# Patient Record
Sex: Female | Born: 1995
Health system: Southern US, Community
[De-identification: ages and names within clinical notes are randomized; demographics above are authoritative.]

## PROBLEM LIST (undated history)

## (undated) DIAGNOSIS — F1910 Other psychoactive substance abuse, uncomplicated: Secondary | ICD-10-CM

## (undated) DIAGNOSIS — F1020 Alcohol dependence, uncomplicated: Secondary | ICD-10-CM

## (undated) DIAGNOSIS — R51 Headache: Secondary | ICD-10-CM

## (undated) DIAGNOSIS — L509 Urticaria, unspecified: Secondary | ICD-10-CM

## (undated) DIAGNOSIS — F329 Major depressive disorder, single episode, unspecified: Secondary | ICD-10-CM

## (undated) DIAGNOSIS — F609 Personality disorder, unspecified: Secondary | ICD-10-CM

## (undated) DIAGNOSIS — E669 Obesity, unspecified: Secondary | ICD-10-CM

## (undated) DIAGNOSIS — D649 Anemia, unspecified: Secondary | ICD-10-CM

## (undated) DIAGNOSIS — F1620 Hallucinogen dependence, uncomplicated: Secondary | ICD-10-CM

## (undated) DIAGNOSIS — F319 Bipolar disorder, unspecified: Secondary | ICD-10-CM

## (undated) DIAGNOSIS — J45909 Unspecified asthma, uncomplicated: Secondary | ICD-10-CM

## (undated) DIAGNOSIS — F32A Depression, unspecified: Secondary | ICD-10-CM

## (undated) HISTORY — DX: Alcohol dependence, uncomplicated: F10.20

## (undated) HISTORY — DX: Hallucinogen dependence, uncomplicated: F16.20

## (undated) HISTORY — DX: Headache: R51

## (undated) HISTORY — DX: Personality disorder, unspecified: F60.9

## (undated) HISTORY — DX: Bipolar disorder, unspecified: F31.9

## (undated) HISTORY — DX: Urticaria, unspecified: L50.9

## (undated) HISTORY — DX: Anemia, unspecified: D64.9

## (undated) HISTORY — DX: Obesity, unspecified: E66.9

## (undated) HISTORY — PX: WISDOM TOOTH EXTRACTION: SHX21

## (undated) HISTORY — PX: COLONOSCOPY: SHX174

## (undated) HISTORY — DX: Other psychoactive substance abuse, uncomplicated: F19.10

## (undated) HISTORY — DX: Unspecified asthma, uncomplicated: J45.909

---
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
## 2013-01-02 ENCOUNTER — Other Ambulatory Visit (HOSPITAL_COMMUNITY): Payer: BC Managed Care – PPO

## 2013-01-03 ENCOUNTER — Other Ambulatory Visit (HOSPITAL_COMMUNITY): Payer: BC Managed Care – PPO

## 2013-01-04 ENCOUNTER — Other Ambulatory Visit (HOSPITAL_COMMUNITY): Payer: BC Managed Care – PPO

## 2013-01-05 ENCOUNTER — Other Ambulatory Visit (HOSPITAL_COMMUNITY): Payer: BC Managed Care – PPO

## 2013-01-06 ENCOUNTER — Encounter (HOSPITAL_COMMUNITY): Payer: Self-pay | Admitting: *Deleted

## 2013-01-06 ENCOUNTER — Emergency Department (HOSPITAL_COMMUNITY)
Admission: EM | Admit: 2013-01-06 | Discharge: 2013-01-06 | Disposition: A | Discharge status: Home / Self Care | Payer: BC Managed Care – PPO | Attending: Emergency Medicine | Admitting: Emergency Medicine

## 2013-01-06 ENCOUNTER — Emergency Department (HOSPITAL_COMMUNITY): Payer: BC Managed Care – PPO

## 2013-01-06 DIAGNOSIS — F17200 Nicotine dependence, unspecified, uncomplicated: Secondary | ICD-10-CM | POA: Insufficient documentation

## 2013-01-06 DIAGNOSIS — Y9289 Other specified places as the place of occurrence of the external cause: Secondary | ICD-10-CM | POA: Insufficient documentation

## 2013-01-06 DIAGNOSIS — Z79899 Other long term (current) drug therapy: Secondary | ICD-10-CM | POA: Insufficient documentation

## 2013-01-06 DIAGNOSIS — F609 Personality disorder, unspecified: Secondary | ICD-10-CM | POA: Insufficient documentation

## 2013-01-06 DIAGNOSIS — W268XXA Contact with other sharp object(s), not elsewhere classified, initial encounter: Secondary | ICD-10-CM | POA: Insufficient documentation

## 2013-01-06 DIAGNOSIS — E669 Obesity, unspecified: Secondary | ICD-10-CM | POA: Insufficient documentation

## 2013-01-06 DIAGNOSIS — J45909 Unspecified asthma, uncomplicated: Secondary | ICD-10-CM | POA: Insufficient documentation

## 2013-01-06 DIAGNOSIS — S81009A Unspecified open wound, unspecified knee, initial encounter: Secondary | ICD-10-CM | POA: Insufficient documentation

## 2013-01-06 DIAGNOSIS — Y9339 Activity, other involving climbing, rappelling and jumping off: Secondary | ICD-10-CM | POA: Insufficient documentation

## 2013-01-06 DIAGNOSIS — S81812A Laceration without foreign body, left lower leg, initial encounter: Secondary | ICD-10-CM

## 2013-01-06 DIAGNOSIS — F319 Bipolar disorder, unspecified: Secondary | ICD-10-CM | POA: Insufficient documentation

## 2013-01-06 NOTE — ED Notes (Signed)
Pt alert and oriented, NAD.

## 2013-01-06 NOTE — ED Notes (Signed)
Pt climbed through a broken window, sustained laceration to lateral L knee. Bleeding controlled. Bandaid placed in triage.

## 2013-01-06 NOTE — ED Provider Notes (Signed)
History     CSN: [id number]  Arrival date & time 01/06/13  0210   First MD Initiated Contact with Patient 01/06/13 0522      Chief Complaint  Patient presents with  . Extremity Laceration    (Consider location/radiation/quality/duration/timing/severity/associated sxs/prior treatment) The history is provided by the patient.   patient with laceration to left knee area after climbing through a broken window. No other deep lacerations. She does have superficial abrasions. No numbness or weakness. She denies possibility of pregnancy.  Past Medical History  Diagnosis Date  . Depression   . Bipolar disorder   . Headache(784.0)   . Asthma   . Obesity   . Personality disorder     Past Surgical History  Procedure Laterality Date  . Wisdom tooth extraction  Age 17    Family History  Problem Relation Age of Onset  . Adopted: Yes  . Alcohol abuse Mother   . Alcohol abuse Father     History  Substance Use Topics  . Smoking status: Current Every Day Smoker -- 0.50 packs/day for .8 years    Types: Cigarettes  . Smokeless tobacco: Never Used     Comment: smokes 1-2 a day  . Alcohol Use: Yes    OB History   Grav Para Term Preterm Abortions TAB SAB Ect Mult Living                  Review of Systems  Cardiovascular: Negative for leg swelling.  Gastrointestinal: Negative for abdominal pain.  Musculoskeletal: Negative for back pain.  Skin: Positive for wound. Negative for color change.    Allergies  Zolpidem tartrate  Home Medications   Current Outpatient Rx  Name  Route  Sig  Dispense  Refill  . albuterol (PROVENTIL HFA;VENTOLIN HFA) 108 (90 BASE) MCG/ACT inhaler   Inhalation   Inhale 2 puffs into the lungs every 6 (six) hours as needed. For shortness of breath.         . carbamazepine (TEGRETOL XR) 100 MG 12 hr tablet   Oral   Take 3 tablets (300 mg total) by mouth 2 (two) times daily. For mood stabilization   60 tablet   0   . ibuprofen (ADVIL,MOTRIN) 200  MG tablet   Oral   Take 400 mg by mouth every 6 (six) hours as needed for pain or headache.         . loratadine (CLARITIN) 10 MG tablet   Oral   Take 10 mg by mouth daily as needed for allergies. May be purchased over the counter: For allergies         . mirtazapine (REMERON) 15 MG tablet   Oral   Take 1 tablet (15 mg total) by mouth at bedtime. For depression/sleep   30 tablet   0   . PARoxetine (PAXIL) 40 MG tablet   Oral   Take 1 tablet (40 mg total) by mouth every evening. For depression   30 tablet   0   . QUEtiapine (SEROQUEL) 200 MG tablet   Oral   Take 200 mg by mouth every evening.         . risperiDONE (RISPERDAL) 0.25 MG tablet   Oral   Take 1 tablet (0.25 mg total) by mouth 2 (two) times daily. Mood control   60 tablet   0     BP 117/95  Pulse 97  Temp(Src) 98 F (36.7 C) (Oral)  Resp 18  SpO2 98%  LMP 01/05/2013  Physical  Exam  Constitutional: She appears well-developed and well-nourished.  HENT:  Head: Normocephalic and atraumatic.  Musculoskeletal: Normal range of motion. She exhibits tenderness.  Mild tenderness over laceration and left knee area. Knee is stable.  Skin:  1 cm flap to lateral left knee. No foreign body seen. Superficial abrasions to the upper left leg.    ED Course  Procedures (including critical care time)  Labs Reviewed - No data to display No results found.   1. Laceration of lower extremity, left, initial encounter       MDM  LACERATION REPAIR Performed by: Billee Cashing. Authorized by: Billee Cashing Consent: Verbal consent obtained. Risks and benefits: risks, benefits and alternatives were discussed Consent given by: patient Patient identity confirmed: provided demographic data Prepped and Draped in normal sterile fashion Wound explored  Laceration Location: Left knee  Laceration Length: 1 cm  No Foreign Bodies seen or palpated  Anesthesia: local infiltration  Local anesthetic:  lidocaine 2 % with epinephrine  Anesthetic total: 2 ml  Irrigation method: syringe Amount of cleaning: standard  Skin closure:  4-0 Prolene   Number of sutures: 3   Technique: Simple interrupted   Patient tolerance: Patient tolerated the procedure well with no immediate complications.    patient with laceration to left knee area. From broken glass. No foreign body seen on x-ray was closed and the stitches can be removed in 7-10 days.     Juliet Rude. Rubin Payor, MD 01/06/13 [phone]

## 2013-01-08 ENCOUNTER — Other Ambulatory Visit (HOSPITAL_COMMUNITY): Payer: BC Managed Care – PPO

## 2013-01-09 ENCOUNTER — Other Ambulatory Visit (HOSPITAL_COMMUNITY): Payer: BC Managed Care – PPO

## 2013-02-13 ENCOUNTER — Encounter: Payer: Self-pay | Admitting: Internal Medicine

## 2013-02-13 ENCOUNTER — Ambulatory Visit (INDEPENDENT_AMBULATORY_CARE_PROVIDER_SITE_OTHER): Payer: BC Managed Care – PPO | Admitting: Internal Medicine

## 2013-02-13 ENCOUNTER — Ambulatory Visit (INDEPENDENT_AMBULATORY_CARE_PROVIDER_SITE_OTHER)
Admission: RE | Admit: 2013-02-13 | Discharge: 2013-02-13 | Disposition: A | Discharge status: Home / Self Care | Payer: BC Managed Care – PPO | Source: Ambulatory Visit | Attending: Internal Medicine | Admitting: Internal Medicine

## 2013-02-13 VITALS — BP 104/66 | HR 79 | Temp 98.3°F | Wt 166.0 lb

## 2013-02-13 DIAGNOSIS — J989 Respiratory disorder, unspecified: Secondary | ICD-10-CM

## 2013-02-13 DIAGNOSIS — R05 Cough: Secondary | ICD-10-CM

## 2013-02-13 DIAGNOSIS — R059 Cough, unspecified: Secondary | ICD-10-CM

## 2013-02-13 DIAGNOSIS — R509 Fever, unspecified: Secondary | ICD-10-CM | POA: Insufficient documentation

## 2013-02-13 DIAGNOSIS — F17200 Nicotine dependence, unspecified, uncomplicated: Secondary | ICD-10-CM | POA: Insufficient documentation

## 2013-02-13 MED ORDER — ALBUTEROL SULFATE HFA 108 (90 BASE) MCG/ACT IN AERS: 2.0000 | INHALATION_SPRAY | Freq: Four times a day (QID) | RESPIRATORY_TRACT | Status: DC | PRN

## 2013-02-13 NOTE — Progress Notes (Signed)
Quick Note:  Tell patient that x ray shows no acute abnormality. No pneumonia Probably a viral illness ______

## 2013-02-13 NOTE — Progress Notes (Signed)
Chief Complaint  Patient presents with  . Sore Throat    Fever was 102 this morning.  Pt also complain of sweating and tremors.  . Cough  . Nasal Congestion    HPI: Patient comes in today for SDA for  new problem evaluation. Here with her mom Onset  Yesterday fever cough and st.   Shaking chills   No real cp some sob but that may predate from tobacco  Out of inhalers  Smoking  ,   cdry cough.   No new rashes.  1/2 ppd.  ROS: See pertinent positives and negatives per HPI. Went through drug rehabilitation has been clean for 6-8 weeks will be put on a new antidepressant per Dr. Nolen Mu seen recently no unusual rashes UTIs syncope new medications  Past Medical History  Diagnosis Date  . Depression   . Bipolar disorder   . Headache(784.0)   . Asthma   . Obesity   . Personality disorder     Family History  Problem Relation Age of Onset  . Adopted: Yes  . Alcohol abuse Mother   . Alcohol abuse Father     History   Social History  . Marital Status: Single    Spouse Name: N/A    Number of Children: N/A  . Years of Education: N/A   Social History Main Topics  . Smoking status: Current Every Day Smoker -- 0.50 packs/day for .8 years    Types: Cigarettes  . Smokeless tobacco: Never Used     Comment: smokes 1-2 a day  . Alcohol Use: Yes  . Drug Use: Yes    Special: Marijuana, Cocaine     Comment: Acid, mushrooms, DNT  . Sexually Active: Not Currently -- Female partner(s)    Birth Control/ Protection: Condom, OCP, Pill   Other Topics Concern  . None   Social History Narrative   Legal Guardians: Molly Maduro and Ashlyn Lahela Woodin   [facility]   Cat   [employer]      [facility]    Outpatient Encounter Prescriptions as of 02/13/2013  Medication Sig Dispense Refill  . albuterol (PROVENTIL HFA;VENTOLIN HFA) 108 (90 BASE) MCG/ACT inhaler Inhale 2 puffs into the lungs every 6 (six) hours as needed. For shortness of breath.  1 Inhaler  2  . Lurasidone HCl (LATUDA)  20 MG TABS Take by mouth.      . [DISCONTINUED] albuterol (PROVENTIL HFA;VENTOLIN HFA) 108 (90 BASE) MCG/ACT inhaler Inhale 2 puffs into the lungs every 6 (six) hours as needed. For shortness of breath.      . [DISCONTINUED] carbamazepine (TEGRETOL XR) 100 MG 12 hr tablet Take 3 tablets (300 mg total) by mouth 2 (two) times daily. For mood stabilization  60 tablet  0  . [DISCONTINUED] ibuprofen (ADVIL,MOTRIN) 200 MG tablet Take 400 mg by mouth every 6 (six) hours as needed for pain or headache.      . [DISCONTINUED] loratadine (CLARITIN) 10 MG tablet Take 10 mg by mouth daily as needed for allergies. May be purchased over the counter: For allergies      . [DISCONTINUED] mirtazapine (REMERON) 15 MG tablet Take 1 tablet (15 mg total) by mouth at bedtime. For depression/sleep  30 tablet  0  . [DISCONTINUED] PARoxetine (PAXIL) 40 MG tablet Take 1 tablet (40 mg total) by mouth every evening. For depression  30 tablet  0  . [DISCONTINUED] QUEtiapine (SEROQUEL) 200 MG tablet Take 200 mg by mouth every evening.      . [  DISCONTINUED] risperiDONE (RISPERDAL) 0.25 MG tablet Take 1 tablet (0.25 mg total) by mouth 2 (two) times daily. Mood control  60 tablet  0   No facility-administered encounter medications on file as of 02/13/2013.    EXAM:  BP 104/66  Pulse 79  Temp(Src) 98.3 F (36.8 C) (Oral)  Wt 166 lb (75.297 kg)  BMI 31.38 kg/m2  SpO2 97%  LMP 02/05/2013  Body mass index is 31.38 kg/(m^2).  GENERAL: vitals reviewed and listed above, alert, oriented, appears well hydrated and in no acute distress congested  Here with mom   HEENT: atraumatic, conjunctiva  clear, no obvious abnormalities on inspection of external nose and ears  piercecd right  Nares congested no face pain OP : no lesion edema or exudate  Right tonsil larger than left   NECK: no obvious masses on inspection palpation  No adenopathy   LUNGS: ? If dec bs right base no w r rh    No retraction  Cough   CV: HRRR, no clubbing  cyanosis or  peripheral edema nl cap refill  Skin no petechiae or unusual rash or scars taking areas previous mutilation scars well-healed MS: moves all extremities without noticeable focal  abnormality  PSYCH: pleasant and cooperative, no obvious depression or anxiety  ASSESSMENT AND PLAN:  Discussed the following assessment and plan:  Febrile respiratory illness - at risk dec bs right? get c ray rx appropriately  - Plan: DG Chest 2 View  Cough  Smoker - Plan: DG Chest 2 View Advise stopping tobacco when she is ready. -Patient advised to return or notify health care team  if symptoms worsen or persist or new concerns arise.  Patient Instructions  Get x ray today. Will be contacted about results. If normal then  Rest fluids  Fevercontrol if needed. Inhaler as needed. Expect fever to be gone in 3 days  And cough can last up to 2-3 weeks .  Recheck or contact if  Alarming symptoms or concerns .   Neta Mends. Panosh M.D.

## 2013-02-13 NOTE — Patient Instructions (Signed)
Get x ray today. Will be contacted about results. If normal then  Rest fluids  Fevercontrol if needed. Inhaler as needed. Expect fever to be gone in 3 days  And cough can last up to 2-3 weeks .  Recheck or contact if  Alarming symptoms or concerns .

## 2013-03-02 ENCOUNTER — Ambulatory Visit (INDEPENDENT_AMBULATORY_CARE_PROVIDER_SITE_OTHER): Payer: BC Managed Care – PPO | Admitting: Internal Medicine

## 2013-03-02 ENCOUNTER — Encounter: Payer: Self-pay | Admitting: Internal Medicine

## 2013-03-02 VITALS — BP 112/72 | HR 89 | Temp 97.8°F | Wt 166.0 lb

## 2013-03-02 DIAGNOSIS — L255 Unspecified contact dermatitis due to plants, except food: Secondary | ICD-10-CM

## 2013-03-02 MED ORDER — PREDNISONE 10 MG PO TABS: ORAL_TABLET | ORAL | Status: DC

## 2013-03-02 MED ORDER — FLUOCINONIDE-E 0.05 % EX CREA: TOPICAL_CREAM | Freq: Two times a day (BID) | CUTANEOUS | Status: DC

## 2013-03-02 NOTE — Progress Notes (Signed)
Chief Complaint  Patient presents with  . Poison Ivy    Has some on her arms, legs and abdomen.    HPI:  Patient comes in today for SDA for  new problem evaluation. Breaking out on face after  In woods 2 -3 night ago.   Going on camping trip today with sobriety group  Some break out on arms also.  ROS: See pertinent positives and negatives per HPI.  Past Medical History  Diagnosis Date  . Depression   . Bipolar disorder   . Headache(784.0)   . Asthma   . Obesity   . Personality disorder     Family History  Problem Relation Age of Onset  . Adopted: Yes  . Alcohol abuse Mother   . Alcohol abuse Father     History   Social History  . Marital Status: Single    Spouse Name: N/A    Number of Children: N/A  . Years of Education: N/A   Social History Main Topics  . Smoking status: Current Every Day Smoker -- 0.50 packs/day for .8 years    Types: Cigarettes  . Smokeless tobacco: Never Used     Comment: smokes 1-2 a day  . Alcohol Use: Yes  . Drug Use: Yes    Special: Marijuana, Cocaine     Comment: Acid, mushrooms, DNT  . Sexually Active: Not Currently -- Female partner(s)    Birth Control/ Protection: Condom, OCP, Pill   Other Topics Concern  . None   Social History Narrative   Legal Guardians: Molly Maduro and Ashlyn Elie Gragert   [facility] of 3   Cat   [employer]      [facility]    Outpatient Encounter Prescriptions as of 03/02/2013  Medication Sig Dispense Refill  . albuterol (PROVENTIL HFA;VENTOLIN HFA) 108 (90 BASE) MCG/ACT inhaler Inhale 2 puffs into the lungs every 6 (six) hours as needed. For shortness of breath.  1 Inhaler  2  . Lurasidone HCl (LATUDA) 20 MG TABS Take by mouth.      . fluocinonide-emollient (LIDEX-E) 0.05 % cream Apply topically 2 (two) times daily. For poison icy not on face  30 g  1  . predniSONE (DELTASONE) 10 MG tablet Take pills per day,,4,4,4,2,2,2,1,1,1  21 tablet  0   No facility-administered encounter medications on file  as of 03/02/2013.    EXAM:  BP 112/72  Pulse 89  Temp(Src) 97.8 F (36.6 C) (Oral)  Wt 166 lb (75.297 kg)  BMI 31.38 kg/m2  SpO2 98%  LMP 02/06/2013  Body mass index is 31.38 kg/(m^2).  GENERAL: vitals reviewed and listed above, alert, oriented, appears well hydrated and in no acute distress  HEENT: atraumatic, conjunctiva  clear, no obvious abnormalities on inspection of external nose and ears   NECK: no obvious masses on inspection palpation  Skin  Rash along right face  contacts derm no edema eyes clear .  Extremities   Faded pidgmented excoriated rash. Left upper thigh MS: moves all extremities without noticeable focal  abnormality  PSYCH: pleasant and cooperative, no obvious depression or anxiety  ASSESSMENT AND PLAN:  Discussed the following assessment and plan:  Plant dermatitis - face arms and legs   Risk benefit of medication discussed. And how to use medication -Patient advised to return or notify health care team  if symptoms worsen or persist or new concerns arise.  Patient Instructions    Poison [facility] ivy is a inflammation of the skin (contact dermatitis)  caused by touching the allergens on the leaves of the ivy plant following previous exposure to the plant. The rash usually appears 48 hours after exposure. The rash is usually bumps (papules) or blisters (vesicles) in a linear pattern. Depending on your own sensitivity, the rash may simply cause redness and itching, or it may also progress to blisters which may break open. These must be well cared for to prevent secondary bacterial (germ) infection, followed by scarring. Keep any open areas dry, clean, dressed, and covered with an antibacterial ointment if needed. The eyes may also get puffy. The puffiness is worst in the morning and gets better as the day progresses. This dermatitis usually heals without scarring, within 2 to 3 weeks without treatment. HOME CARE INSTRUCTIONS  Thoroughly wash with soap and  water as soon as you have been exposed to poison ivy. You have about one half hour to remove the plant resin before it will cause the rash. This washing will destroy the oil or antigen on the skin that is causing, or will cause, the rash. Be sure to wash under your fingernails as any plant resin there will continue to spread the rash. Do not rub skin vigorously when washing affected area. Poison ivy cannot spread if no oil from the plant remains on your body. A rash that has progressed to weeping sores will not spread the rash unless you have not washed thoroughly. It is also important to wash any clothes you have been wearing as these may carry active allergens. The rash will return if you wear the unwashed clothing, even several days later. Avoidance of the plant in the future is the best measure. Poison ivy plant can be recognized by the number of leaves. Generally, poison ivy has three leaves with flowering branches on a single stem. Diphenhydramine may be purchased over the counter and used as needed for itching. Do not drive with this medication if it makes you drowsy.Ask your caregiver about medication for children. SEEK MEDICAL CARE IF:  Open sores develop.  Redness spreads beyond area of rash.  You notice purulent (pus-like) discharge.  You have increased pain.  Other signs of infection develop (such as fever). Document Released: 07/23/2000 Document Revised: 10/18/2011 Document Reviewed: 06/11/2009 [facility] Patient Information 2014 [place], [place].      Neta Mends. Indy Kuck M.D.

## 2013-03-02 NOTE — Patient Instructions (Signed)
Poison Ivy Poison ivy is a inflammation of the skin (contact dermatitis) caused by touching the allergens on the leaves of the ivy plant following previous exposure to the plant. The rash usually appears 48 hours after exposure. The rash is usually bumps (papules) or blisters (vesicles) in a linear pattern. Depending on your own sensitivity, the rash may simply cause redness and itching, or it may also progress to blisters which may break open. These must be well cared for to prevent secondary bacterial (germ) infection, followed by scarring. Keep any open areas dry, clean, dressed, and covered with an antibacterial ointment if needed. The eyes may also get puffy. The puffiness is worst in the morning and gets better as the day progresses. This dermatitis usually heals without scarring, within 2 to 3 weeks without treatment. HOME CARE INSTRUCTIONS  Thoroughly wash with soap and water as soon as you have been exposed to poison ivy. You have about one half hour to remove the plant resin before it will cause the rash. This washing will destroy the oil or antigen on the skin that is causing, or will cause, the rash. Be sure to wash under your fingernails as any plant resin there will continue to spread the rash. Do not rub skin vigorously when washing affected area. Poison ivy cannot spread if no oil from the plant remains on your body. A rash that has progressed to weeping sores will not spread the rash unless you have not washed thoroughly. It is also important to wash any clothes you have been wearing as these may carry active allergens. The rash will return if you wear the unwashed clothing, even several days later. Avoidance of the plant in the future is the best measure. Poison ivy plant can be recognized by the number of leaves. Generally, poison ivy has three leaves with flowering branches on a single stem. Diphenhydramine may be purchased over the counter and used as needed for itching. Do not drive with  this medication if it makes you drowsy.Ask your caregiver about medication for children. SEEK MEDICAL CARE IF:  Open sores develop.  Redness spreads beyond area of rash.  You notice purulent (pus-like) discharge.  You have increased pain.  Other signs of infection develop (such as fever). Document Released: 07/23/2000 Document Revised: 10/18/2011 Document Reviewed: 06/11/2009 [employer] Patient Information 2014 [employer].  

## 2013-08-14 ENCOUNTER — Ambulatory Visit (INDEPENDENT_AMBULATORY_CARE_PROVIDER_SITE_OTHER): Payer: BC Managed Care – PPO | Admitting: Internal Medicine

## 2013-08-14 ENCOUNTER — Encounter: Payer: Self-pay | Admitting: Internal Medicine

## 2013-08-14 VITALS — BP 100/80 | HR 93 | Temp 97.9°F | Wt 158.0 lb

## 2013-08-14 DIAGNOSIS — Z3009 Encounter for other general counseling and advice on contraception: Secondary | ICD-10-CM

## 2013-08-14 DIAGNOSIS — J029 Acute pharyngitis, unspecified: Secondary | ICD-10-CM

## 2013-08-14 DIAGNOSIS — F17200 Nicotine dependence, unspecified, uncomplicated: Secondary | ICD-10-CM

## 2013-08-14 DIAGNOSIS — R509 Fever, unspecified: Secondary | ICD-10-CM

## 2013-08-14 DIAGNOSIS — Z30011 Encounter for initial prescription of contraceptive pills: Secondary | ICD-10-CM

## 2013-08-14 LAB — POCT INFLUENZA A/B
Influenza A, POC: NEGATIVE
Influenza B, POC: NEGATIVE

## 2013-08-14 LAB — POCT RAPID STREP A (OFFICE): RAPID STREP A SCREEN: NEGATIVE

## 2013-08-14 MED ORDER — AMOXICILLIN 500 MG PO CAPS: 500.0000 mg | ORAL_CAPSULE | Freq: Two times a day (BID) | ORAL | Status: DC

## 2013-08-14 MED ORDER — NORETHIN ACE-ETH ESTRAD-FE 1.5-30 MG-MCG PO TABS: 1.0000 | ORAL_TABLET | Freq: Every day | ORAL | Status: DC

## 2013-08-14 NOTE — Patient Instructions (Addendum)
This could be flu  But because of sore threoat can consider adding amociuciliin until culture for strep is back . Rest fluids  Recheck if fever is  persistent or progressive   Tobacco is risk factor for clotting and heart attack stroke on hormonal therapy . Please stop tobacco for health reasons.     Oral Contraception Information Oral contraceptives (OCs) are medicines taken to prevent pregnancy. OCs work by preventing the ovaries from releasing eggs. The hormones in OCs also cause the cervical mucus to thicken, preventing the sperm from entering the uterus. The hormones also cause the uterine lining to become thin, not allowing a fertilized egg to attach to the inside of the uterus. OCs are highly effective when taken exactly as prescribed. However, OCs do not prevent sexually transmitted diseases (STDs). Safe sex practices, such as using condoms along with the pill, can help prevent STDs.  Before taking the pill, you may have a physical exam and Pap test. Your caregiver may order blood tests that may be necessary. Your caregiver will make sure you are a good candidate for oral contraception. Discuss with your caregiver the possible side effects of the OC you may be prescribed. When starting an OC, it can take 2 to 3 months for the body to adjust to the changes in hormone levels in your body.  TYPES OF ORAL CONTRACEPTION  The combination pill. This pill contains estrogen and progestin (synthetic progesterone) hormones. The combination pill comes in either 21-day or 28-day packs. With 21-day packs, you do not take pills for 7 days after the last pill. With 28-day packs, the pill is taken every day. The last 7 pills are without hormones. Certain types of pills have more than 21 hormone-containing pills.  The minipill. This pill contains the progesterone hormone only. It is taken every day continuously. The minipill comes in packs of 91 pills. The first 84 pills contain the hormones, and the last 7  pills do not. The last 7 days are when you will have your menstrual period. You may experience irregular spotting. ADVANTAGES  Decreases premenstrual symptoms.  Treats menstrual period cramps.  Regulates the menstrual cycle.  Decreases a heavy menstrual flow.  Treats acne.  Treats abnormal uterine bleeding.  Treats chronic pelvic pain.  Treats polycystic ovarian syndrome.  Treats endometriosis.  Can be used as emergency contraception. DISADVANTAGES OCs can be less effective if:  You forget to take the pill at the same time every day.  You have a stomach or intestinal disease that lessens the absorption of the pill.  You take OCs with other medicines that make OCs less effective.  You take expired OCs.  You forget to restart the pill on day 7, when using the packs of 21 pills. Document Released: 10/16/2002 Document Revised: 10/18/2011 Document Reviewed: 01/14/2013 [facility] Patient Information 2014 [place], [place].

## 2013-08-14 NOTE — Progress Notes (Signed)
Chief Complaint  Patient presents with  . Fever    Started Friday night.  Is taking Advil for fever and bodyaches.  . Sore Throat  . Cough  . Nausea  . Generalized Body Aches  . Chills    HPI: Patient comes in today for SDA for  new problem evaluation. 3  day or so onset  Of   And off an on  102.7  . Throat pain also concern about strep. Hurts to swallow quite badly. Has taken Tylenol over-the-counter Advil for body aches and fever Remote hx of strep.  ? Exposures. No significant cough minor at this time some nasal congestion no unusual rashes No flu vaccine .  Also wants to get back on birth control pills No problem with pill. Remote past to be used for period regulation and contraception..  Still uses tobacco some. Otherwise substance free per report. ROS: See pertinent positives and negatives per HPI. Negative for chest pain shortness of breath syncope she is adopted family history unknown. For clotting  Past Medical History  Diagnosis Date  . Depression   . Bipolar disorder   . Headache(784.0)   . Asthma   . Obesity   . Personality disorder     Family History  Problem Relation Age of Onset  . Adopted: Yes  . Alcohol abuse Mother   . Alcohol abuse Father     History   Social History  . Marital Status: Single    Spouse Name: N/A    Number of Children: N/A  . Years of Education: N/A   Social History Main Topics  . Smoking status: Current Every Day Smoker -- 0.50 packs/day for .8 years    Types: Cigarettes  . Smokeless tobacco: Never Used     Comment: smokes 1-2 a day  . Alcohol Use: Yes  . Drug Use: Yes    Special: Marijuana, Cocaine     Comment: Acid, mushrooms, DNT  . Sexual Activity: Not Currently    Partners: Female    Birth Control/ Protection: Condom, OCP, Pill   Other Topics Concern  . None   Social History Narrative   Legal Guardians: Herbie [place] and Tree surgeon   [facility] [place]    Outpatient  Encounter Prescriptions as of 08/14/2013  Medication Sig  . albuterol (PROVENTIL HFA;VENTOLIN HFA) 108 (90 BASE) MCG/ACT inhaler Inhale 2 puffs into the lungs every 6 (six) hours as needed. For shortness of breath.  . hydrOXYzine (VISTARIL) 25 MG capsule   . amoxicillin (AMOXIL) 500 MG capsule Take 1 capsule (500 mg total) by mouth 2 (two) times daily. If needed for strep throat  . norethindrone-ethinyl estradiol-iron (MICROGESTIN FE,GILDESS FE,LOESTRIN FE) 1.5-30 MG-MCG tablet Take 1 tablet by mouth daily.  . [DISCONTINUED] fluocinonide-emollient (LIDEX-E) 0.05 % cream Apply topically 2 (two) times daily. For poison icy not on face  . [DISCONTINUED] Lurasidone HCl (LATUDA) 20 MG TABS Take by mouth.  . [DISCONTINUED] predniSONE (DELTASONE) 10 MG tablet Take pills per day,,4,4,4,2,2,2,1,1,1    EXAM:  BP 100/80  Pulse 93  Temp(Src) 97.9 F (36.6 C) (Oral)  Wt 158 lb (71.668 kg)  SpO2 98%  Body mass index is 29.87 kg/(m^2).  WDWN in NAD  quiet respirations;   somewhat hoarse. Non toxic . HEENT: Normocephalic ;atraumatic , Eyes;  PERRL, EOMs  Full, lids and conjunctiva clear,,Ears: no deformities, canals nl, TM landmarks normal, Nose: no deformity  or discharge  minimally tender Mouth : OPtonsil 1-2+ nno exudate edema red no petechia  Neck: Supple tender ac nodes shoddy nodes no or masses or bruits Chest:  Clear to A&P without wheezes rales or rhonchi CV:  S1-S2 no gallops or murmurs peripheral perfusion is normal Skin :nl perfusion and no acute rashes  manuy piercing's   PSYCH: pleasant and cooperative, no obvious depression or anxiety  ASSESSMENT AND PLAN:  Discussed the following assessment and plan:  Acute pharyngitis - Plan: POCT rapid strep A, Culture, Group A Strep, POCT Influenza A/B  Fever, unspecified - Plan: POCT rapid strep A, Culture, Group A Strep, POCT Influenza A/B  OCP (oral contraceptive pills) initiation  Smoker Acute pharyngitis with fever this still could be  flow rapid strep negative can cover for strep with antibiotic in the short run and stopped if cultures negative. Either way expect fever to improve in the next 2-3 days.  Acceptable candidate for OCP benefit more than risk discussed importance of smoking cessation tobacco free state to decrease risk with the OCPs. She's due per preventive visit we'll have her followup with the wellness visit and recheck medicine OCP in 3-4 months. Patient agrees. -Patient advised to return or notify health care team  if symptoms worsen or persist or new concerns arise.  Patient Instructions  This could be flu  But because of sore threoat can consider adding amociuciliin until culture for strep is back . Rest fluids  Recheck if fever is  persistent or progressive   Tobacco is risk factor for clotting and heart attack stroke on hormonal therapy . Please stop tobacco for health reasons.     Oral Contraception Information Oral contraceptives (OCs) are medicines taken to prevent pregnancy. OCs work by preventing the ovaries from releasing eggs. The hormones in OCs also cause the cervical mucus to thicken, preventing the sperm from entering the uterus. The hormones also cause the uterine lining to become thin, not allowing a fertilized egg to attach to the inside of the uterus. OCs are highly effective when taken exactly as prescribed. However, OCs do not prevent sexually transmitted diseases (STDs). Safe sex practices, such as using condoms along with the pill, can help prevent STDs.  Before taking the pill, you may have a physical exam and Pap test. Your caregiver may order blood tests that may be necessary. Your caregiver will make sure you are a good candidate for oral contraception. Discuss with your caregiver the possible side effects of the OC you may be prescribed. When starting an OC, it can take 2 to 3 months for the body to adjust to the changes in hormone levels in your body.  TYPES OF ORAL  CONTRACEPTION  The combination pill. This pill contains estrogen and progestin (synthetic progesterone) hormones. The combination pill comes in either 21-day or 28-day packs. With 21-day packs, you do not take pills for 7 days after the last pill. With 28-day packs, the pill is taken every day. The last 7 pills are without hormones. Certain types of pills have more than 21 hormone-containing pills.  The minipill. This pill contains the progesterone hormone only. It is taken every day continuously. The minipill comes in packs of 91 pills. The first 84 pills contain the hormones, and the last 7 pills do not. The last 7 days are when you will have your menstrual period. You may experience irregular spotting. ADVANTAGES  Decreases premenstrual symptoms.  Treats menstrual period cramps.  Regulates the menstrual cycle.  Decreases  a heavy menstrual flow.  Treats acne.  Treats abnormal uterine bleeding.  Treats chronic pelvic pain.  Treats polycystic ovarian syndrome.  Treats endometriosis.  Can be used as emergency contraception. DISADVANTAGES OCs can be less effective if:  You forget to take the pill at the same time every day.  You have a stomach or intestinal disease that lessens the absorption of the pill.  You take OCs with other medicines that make OCs less effective.  You take expired OCs.  You forget to restart the pill on day 7, when using the packs of 21 pills. Document Released: 10/16/2002 Document Revised: 10/18/2011 Document Reviewed: 01/14/2013 [facility] Patient Information 2014 [place], [place].      Standley Brooking. Panosh M.D.  Pre visit review using our clinic review tool, if applicable. No additional management support is needed unless otherwise documented below in the visit note.

## 2013-08-15 DIAGNOSIS — J029 Acute pharyngitis, unspecified: Secondary | ICD-10-CM | POA: Insufficient documentation

## 2013-08-15 DIAGNOSIS — R509 Fever, unspecified: Secondary | ICD-10-CM | POA: Insufficient documentation

## 2013-08-15 DIAGNOSIS — Z30011 Encounter for initial prescription of contraceptive pills: Secondary | ICD-10-CM | POA: Insufficient documentation

## 2013-08-16 LAB — CULTURE, GROUP A STREP

## 2013-09-03 ENCOUNTER — Encounter: Payer: Self-pay | Admitting: Family Medicine

## 2013-09-03 ENCOUNTER — Ambulatory Visit (INDEPENDENT_AMBULATORY_CARE_PROVIDER_SITE_OTHER): Payer: BC Managed Care – PPO | Admitting: Family Medicine

## 2013-09-03 VITALS — BP 108/80 | HR 79 | Temp 97.9°F | Wt 156.0 lb

## 2013-09-03 DIAGNOSIS — J069 Acute upper respiratory infection, unspecified: Secondary | ICD-10-CM

## 2013-09-03 LAB — POCT INFLUENZA A/B
INFLUENZA A, POC: NEGATIVE
Influenza B, POC: NEGATIVE

## 2013-09-03 NOTE — Patient Instructions (Signed)

## 2013-09-03 NOTE — Progress Notes (Signed)
Pre visit review using our clinic review tool, if applicable. No additional management support is needed unless otherwise documented below in the visit note. 

## 2013-09-03 NOTE — Progress Notes (Signed)
Chief Complaint  Patient presents with  . Cough    sweaty/cold, headache, sore throat    HPI:  -started:last night -symptoms:nasal congestion, sore throat, cough, headache, body aches -denies:fever, SOB, NVD, tooth pain -has tried: tea -sick contacts/travel/risks: denies flu exposure, had strep 3 weeks ago - treated with abx -Hx of: allergies  ROS: See pertinent positives and negatives per HPI.  Past Medical History  Diagnosis Date  . Depression   . Bipolar disorder   . Headache(784.0)   . Asthma   . Obesity   . Personality disorder     Past Surgical History  Procedure Laterality Date  . Wisdom tooth extraction  Age 18    Family History  Problem Relation Age of Onset  . Adopted: Yes  . Alcohol abuse Mother   . Alcohol abuse Father     History   Social History  . Marital Status: Single    Spouse Name: N/A    Number of Children: N/A  . Years of Education: N/A   Social History Main Topics  . Smoking status: Current Every Day Smoker -- 0.50 packs/day for .8 years    Types: Cigarettes  . Smokeless tobacco: Never Used     Comment: smokes 1-2 a day  . Alcohol Use: Yes  . Drug Use: Yes    Special: Marijuana, Cocaine     Comment: Acid, mushrooms, DNT  . Sexual Activity: Not Currently    Partners: Female    Birth Control/ Protection: Condom, OCP, Pill   Other Topics Concern  . None   Social History Narrative   Legal Guardians: Robert and Tree surgeon   [facility] [place]    Current outpatient prescriptions:albuterol (PROVENTIL HFA;VENTOLIN HFA) 108 (90 BASE) MCG/ACT inhaler, Inhale 2 puffs into the lungs every 6 (six) hours as needed. For shortness of breath., Disp: 1 Inhaler, Rfl: 2;  hydrOXYzine (VISTARIL) 25 MG capsule, , Disp: , Rfl: ;  norethindrone-ethinyl estradiol-iron (MICROGESTIN FE,GILDESS FE,LOESTRIN FE) 1.5-30 MG-MCG tablet, Take 1 tablet by mouth daily., Disp: 1 Package, Rfl: 3  EXAM:  Filed  Vitals:   09/03/13 1344  BP: 108/80  Pulse: 79  Temp: 97.9 F (36.6 C)    Body mass index is 29.49 kg/(m^2).  GENERAL: vitals reviewed and listed above, alert, oriented, appears well hydrated and in no acute distress  HEENT: atraumatic, conjunttiva clear, no obvious abnormalities on inspection of external nose and ears, normal appearance of ear canals and TMs, clear nasal congestion, mild post oropharyngeal erythema with PND, no tonsillar edema or exudate, no sinus TTP  NECK: no obvious masses on inspection  LUNGS: clear to auscultation bilaterally, no wheezes, rales or rhonchi, good air movement  CV: HRRR, no peripheral edema  MS: moves all extremities without noticeable abnormality  PSYCH: pleasant and cooperative, no obvious depression or anxiety  ASSESSMENT AND PLAN:  Discussed the following assessment and plan:  Acute upper respiratory infections of unspecified site - Plan: POC Influenza A/B  -flu test neg -given HPI and exam findings today, a serious infection or illness is unlikely. We discussed potential etiologies, with VURI being most likely, and advised supportive care and monitoring. We discussed treatment side effects, likely course, antibiotic misuse, transmission, and signs of developing a serious illness. -not having asthma symptoms - advised to use alb if needed and of course follow up if asthma symptoms -of course, we advised to return or  notify a doctor immediately if symptoms worsen or persist or new concerns arise.    Patient Instructions  INSTRUCTIONS FOR UPPER RESPIRATORY INFECTION:  -plenty of rest and fluids  -nasal saline wash 2-3 times daily (use prepackaged nasal saline or bottled/distilled water if making your own)   -can use sinex or afrin nasal spray for drainage and nasal congestion - but do NOT use longer then 3-4 days  -can use tylenol or ibuprofen as directed for aches and sorethroat  -in the winter time, using a humidifier at night  is helpful (please follow cleaning instructions)  -if you are taking a cough medication - use only as directed, may also try a teaspoon of honey to coat the throat and throat lozenges  -for sore throat, salt water gargles can help  -follow up if you have fevers, facial pain, tooth pain, difficulty breathing or are worsening or not getting better in 5-7 days      KIM, HANNAH R.

## 2013-09-11 ENCOUNTER — Telehealth: Payer: Self-pay | Admitting: Internal Medicine

## 2013-09-11 NOTE — Telephone Encounter (Signed)
Relevant patient education assigned to patient using Emmi. ° °

## 2013-10-15 ENCOUNTER — Ambulatory Visit (INDEPENDENT_AMBULATORY_CARE_PROVIDER_SITE_OTHER): Payer: BC Managed Care – PPO | Admitting: Internal Medicine

## 2013-10-15 ENCOUNTER — Encounter: Payer: Self-pay | Admitting: Internal Medicine

## 2013-10-15 VITALS — BP 120/72 | HR 77 | Temp 97.1°F | Resp 16 | Wt 146.0 lb

## 2013-10-15 DIAGNOSIS — J019 Acute sinusitis, unspecified: Secondary | ICD-10-CM | POA: Insufficient documentation

## 2013-10-15 MED ORDER — METHYLPREDNISOLONE ACETATE 80 MG/ML IJ SUSP
120.0000 mg | Freq: Once | INTRAMUSCULAR | Status: AC
  Administered 2013-10-15: 120 mg via INTRAMUSCULAR

## 2013-10-15 MED ORDER — AMOXICILLIN 875 MG PO TABS: 875.0000 mg | ORAL_TABLET | Freq: Two times a day (BID) | ORAL | Status: DC

## 2013-10-15 NOTE — Progress Notes (Signed)
Subjective:    Patient ID: Candace Shaw, female    DOB: 1996-08-01, 18 y.o.   MRN: [id number]  Sinusitis This is a new problem. The current episode started in the past 7 days. The problem has been gradually worsening since onset. There has been no fever. The fever has been present for less than 1 day. Her pain is at a severity of 0/10. She is experiencing no pain. Associated symptoms include chills, congestion and sinus pressure. Pertinent negatives include no coughing, diaphoresis, ear pain, headaches, hoarse voice, neck pain, shortness of breath, sneezing, sore throat or swollen glands. Past treatments include oral decongestants. The treatment provided mild relief.      Review of Systems  Constitutional: Positive for chills. Negative for fever, diaphoresis, activity change, appetite change and fatigue.  HENT: Positive for congestion, postnasal drip, rhinorrhea and sinus pressure. Negative for ear pain, hoarse voice, mouth sores, nosebleeds, sneezing and sore throat.   Eyes: Negative.   Respiratory: Negative.  Negative for cough, choking, chest tightness, shortness of breath, wheezing and stridor.   Cardiovascular: Negative.  Negative for chest pain, palpitations and leg swelling.  Gastrointestinal: Negative.  Negative for nausea, vomiting, abdominal pain, diarrhea, constipation and blood in stool.  Endocrine: Negative.   Genitourinary: Negative.   Musculoskeletal: Negative.  Negative for arthralgias, myalgias and neck pain.  Skin: Negative.   Allergic/Immunologic: Negative.   Neurological: Negative.  Negative for dizziness, tremors, weakness, light-headedness and headaches.  Hematological: Negative.  Negative for adenopathy. Does not bruise/bleed easily.  Psychiatric/Behavioral: Negative.        Objective:   Physical Exam  Vitals reviewed. Constitutional: She is oriented to person, place, and time. She appears well-developed and well-nourished.  Non-toxic appearance. She does  not have a sickly appearance. She does not appear ill. No distress.  HENT:  Head: Normocephalic and atraumatic.  Right Ear: Hearing, tympanic membrane, external ear and ear canal normal.  Left Ear: Hearing, tympanic membrane, external ear and ear canal normal.  Nose: Mucosal edema and rhinorrhea present. No sinus tenderness. Right sinus exhibits maxillary sinus tenderness. Right sinus exhibits no frontal sinus tenderness. Left sinus exhibits no maxillary sinus tenderness and no frontal sinus tenderness.  Mouth/Throat: Oropharynx is clear and moist and mucous membranes are normal. Mucous membranes are not pale, not dry and not cyanotic. No oral lesions. No trismus in the jaw. No uvula swelling. No oropharyngeal exudate, posterior oropharyngeal edema, posterior oropharyngeal erythema or tonsillar abscesses.  Eyes: Conjunctivae are normal. Right eye exhibits no discharge. Left eye exhibits no discharge. No scleral icterus.  Neck: Normal range of motion. Neck supple. No JVD present. No tracheal deviation present. No thyromegaly present.  Cardiovascular: Normal rate, regular rhythm, normal heart sounds and intact distal pulses.  Exam reveals no gallop and no friction rub.   No murmur heard. Pulmonary/Chest: Effort normal and breath sounds normal. No stridor. No respiratory distress. She has no wheezes. She has no rales. She exhibits no tenderness.  Abdominal: Soft. Bowel sounds are normal. She exhibits no distension and no mass. There is no tenderness. There is no rebound and no guarding.  Musculoskeletal: Normal range of motion. She exhibits no edema and no tenderness.  Lymphadenopathy:    She has no cervical adenopathy.  Neurological: She is oriented to person, place, and time.  Skin: Skin is warm and dry. No rash noted. She is not diaphoretic. No erythema. No pallor.  Psychiatric: She has a normal mood and affect. Her behavior is normal. Judgment and thought  content normal.     Lab Results    Component Value Date   WBC 7.6 12/15/2012   HGB 12.8 12/15/2012   HCT 37.7 12/15/2012   PLT 308 12/15/2012   GLUCOSE 93 12/15/2012   ALT 19 12/15/2012   AST 22 12/15/2012   NA 140 12/15/2012   K 3.9 12/15/2012   CL 104 12/15/2012   CREATININE 0.57 12/15/2012   BUN 15 12/15/2012   CO2 23 12/15/2012   TSH 2.61 08/11/2011       Assessment & Plan:

## 2013-10-15 NOTE — Progress Notes (Signed)
Pre visit review using our clinic review tool, if applicable. No additional management support is needed unless otherwise documented below in the visit note. 

## 2013-10-15 NOTE — Patient Instructions (Signed)

## 2013-10-16 ENCOUNTER — Encounter: Payer: Self-pay | Admitting: Internal Medicine

## 2013-10-16 ENCOUNTER — Telehealth: Payer: Self-pay | Admitting: Internal Medicine

## 2013-10-16 NOTE — Telephone Encounter (Signed)
Relevant patient education assigned to patient using Emmi. ° °

## 2013-10-16 NOTE — Assessment & Plan Note (Signed)
She has a lot of inflammatory s/s so I gave her an injection of depo-medrol IM, she will cont taking decongestants I will treat the infection with Amoxil

## 2013-12-14 ENCOUNTER — Encounter: Payer: Self-pay | Admitting: Internal Medicine

## 2013-12-14 ENCOUNTER — Ambulatory Visit (INDEPENDENT_AMBULATORY_CARE_PROVIDER_SITE_OTHER): Payer: BC Managed Care – PPO | Admitting: Internal Medicine

## 2013-12-14 VITALS — BP 100/66 | Temp 97.5°F | Ht 61.0 in | Wt 139.0 lb

## 2013-12-14 DIAGNOSIS — R509 Fever, unspecified: Secondary | ICD-10-CM

## 2013-12-14 DIAGNOSIS — R51 Headache: Secondary | ICD-10-CM | POA: Insufficient documentation

## 2013-12-14 DIAGNOSIS — F17200 Nicotine dependence, unspecified, uncomplicated: Secondary | ICD-10-CM

## 2013-12-14 DIAGNOSIS — R519 Headache, unspecified: Secondary | ICD-10-CM | POA: Insufficient documentation

## 2013-12-14 DIAGNOSIS — J069 Acute upper respiratory infection, unspecified: Secondary | ICD-10-CM

## 2013-12-14 NOTE — Progress Notes (Signed)
Pre visit review using our clinic review tool, if applicable. No additional management support is needed unless otherwise documented below in the visit note.   Chief Complaint  Patient presents with  . Cough    Started on Tuesday.  . Migraine  . Generalized Body Aches  . Fever  . Fatigue    HPI: Patient comes in today for SDA for  new problem evaluation. Onset 2 days ago headache and then body aches  And  Had migraine for 6 hours last night and took 4 ibuprofen. Developed fevers last night   101.9  has an upper respiratory congestion and a cough she is stop smoking since she got sick. 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 Group.    cough some part of it. No specific shortness of breath vomiting times one no diarrhea no unusual rashes. No tick bites high tick exposure or recent travel. He is coming up on a year clean in rehabilitation.  ROS: See pertinent positives and negatives per HPI. Negative for chest pain syncope rest as per history of present illness  Past Medical History  Diagnosis Date  . Depression   . Bipolar disorder   . Headache(784.0)   . Asthma   . Obesity   . Personality disorder     Family History  Problem Relation Age of Onset  . Adopted: Yes  . Alcohol abuse Mother   . Alcohol abuse Father     History   Social History  . Marital Status: Single    Spouse Name: N/A    Number of Children: N/A  . Years of Education: N/A   Social History Main Topics  . Smoking status: Current Every Day Smoker -- 0.50 packs/day for .8 years    Types: Cigarettes  . Smokeless tobacco: Never Used     Comment: smokes 1-2 a day  . Alcohol Use: Yes  . Drug Use: Yes    Special: Marijuana, Cocaine     Comment: Acid, mushrooms, DNT  . Sexual Activity: Not Currently    Partners: Female    Birth Control/ Protection: Condom, OCP, Pill   Other Topics Concern  . None    Social History Narrative   Legal Guardians: Herbie [place] and Tree surgeon   [facility] [place]    Outpatient Encounter Prescriptions as of 12/14/2013  Medication Sig  . albuterol (PROVENTIL HFA;VENTOLIN HFA) 108 (90 BASE) MCG/ACT inhaler Inhale 2 puffs into the lungs every 6 (six) hours as needed. For shortness of breath.  . [DISCONTINUED] amoxicillin (AMOXIL) 875 MG tablet Take 1 tablet (875 mg total) by mouth 2 (two) times daily.  . [DISCONTINUED] hydrOXYzine (VISTARIL) 25 MG capsule   . [DISCONTINUED] norethindrone-ethinyl estradiol-iron (MICROGESTIN FE,GILDESS FE,LOESTRIN FE) 1.5-30 MG-MCG tablet Take 1 tablet by mouth daily.    EXAM:  BP 100/66  Temp(Src) 97.5 F (36.4 C) (Oral)  Ht 5\' 1"  (1.549 m)  Wt 139 lb (63.05 kg)  BMI 26.28 kg/m2  Body mass index is 26.28 kg/(m^2). WDWN in NAD  quiet respirations; mildly congested  somewhat hoarse. Non toxic . Mildly ill HEENT: Normocephalic ;atraumatic , Eyes;  PERRL, EOMs  Full, lids and conjunctiva clear,,Ears: no deformities, canals nl, TM landmarks normal, Nose: no deformity or discharge but congested;face minimally tender Mouth : OP clear without lesion or edema .tonsil  1 +  Neck: Supple without adenopathy or masses or bruits Chest:  Clear to A&P without wheezes rales or rhonchi CV:  S1-S2 no gallops or murmurs peripheral perfusion is normal Abdomen:  Sof,t normal bowel sounds without hepatosplenomegaly, no guarding rebound or masses no CVA tenderness Skin :nl perfusion and no acute rashes piercings and tattoos. No acute findings Neurologic nonfocal ASSESSMENT AND PLAN:  Discussed the following assessment and plan:  Acute upper respiratory infection of multiple sites  Fever, unspecified  Headache(784.0) - hx migraines  Smoker - agree with stopping Discussed caution and close observation not at risk for tick-related disease no evidence of CNS infection acts flulike respiratory.  Contact us with alarm features. Call on call service this weekend if needed. Disc communicability  -Patient advised to return or notify health care team  if symptoms worsen ,persist or new concerns arise.  Patient Instructions  This acts like  a viral resp infection  Your chest exam is normal today and no evidence of pneumonia .  Fluids rest  Avoid dehydration Plan reevaluation .  If fever not gone in another 48 hours  Or if get rash or if getting worse . The cough could last another 1-2 weeks. Try  2.5 aleve ( generic) twice a day for the migraine if comes back .        Standley Brooking. Panosh M.D.

## 2013-12-14 NOTE — Patient Instructions (Signed)
This acts like  a viral resp infection  Your chest exam is normal today and no evidence of pneumonia .  Fluids rest  Avoid dehydration Plan reevaluation .  If fever not gone in another 48 hours  Or if get rash or if getting worse . The cough could last another 1-2 weeks. Try  2.5 aleve ( generic) twice a day for the migraine if comes back .

## 2014-01-11 ENCOUNTER — Ambulatory Visit (INDEPENDENT_AMBULATORY_CARE_PROVIDER_SITE_OTHER): Payer: BC Managed Care – PPO | Admitting: Family Medicine

## 2014-01-11 ENCOUNTER — Encounter: Payer: Self-pay | Admitting: Family Medicine

## 2014-01-11 VITALS — BP 112/70 | HR 70 | Temp 98.5°F | Wt 138.0 lb

## 2014-01-11 DIAGNOSIS — L259 Unspecified contact dermatitis, unspecified cause: Secondary | ICD-10-CM

## 2014-01-11 MED ORDER — PREDNISONE 10 MG PO TABS: ORAL_TABLET | ORAL | Status: DC

## 2014-01-11 NOTE — Progress Notes (Signed)
   Subjective:    Patient ID: Candace Shaw, female    DOB: 11-21-1995, 18 y.o.   MRN: [id number]  Poison Ivy Pertinent negatives include no fever.   Pruritic rash. Onset 2-3 days ago. She was out in some high vegetation recently and was in contact with some poison ivy. Symptoms are worse with heat. No alleviating factors. She's been scratching this frequently. Moderate to severe symptoms of itching. Similar outbreak in the past. She has taken prednisone without difficulty in the past.  Past Medical History  Diagnosis Date  . Depression   . Bipolar disorder   . Headache(784.0)   . Asthma   . Obesity   . Personality disorder    Past Surgical History  Procedure Laterality Date  . Wisdom tooth extraction  Age 55    reports that she has been smoking Cigarettes.  She has a .4 pack-year smoking history. She has never used smokeless tobacco. She reports that she drinks alcohol. She reports that she uses illicit drugs (Marijuana and Cocaine). family history includes Alcohol abuse in her father and mother. She was adopted. Allergies  Allergen Reactions  . Zolpidem Tartrate Other (See Comments)    Hallucinations      Review of Systems  Constitutional: Negative for fever and chills.  Skin: Positive for rash.       Objective:   Physical Exam  Constitutional: She appears well-developed and well-nourished.  Cardiovascular: Normal rate and regular rhythm.   Skin: Rash noted.  Patient has multiple areas of rash including lower extremities, upper extremities, neck. Erythematous and slightly raised and somewhat linear in some areas. Multiple excoriations. No vesicles. No pustules. Non-scaly.          Assessment & Plan:   contact dermatitis with widespread involvement. Prednisone taper. Reviewed possible side effects. Followup as needed

## 2014-01-11 NOTE — Progress Notes (Signed)
Pre visit review using our clinic review tool, if applicable. No additional management support is needed unless otherwise documented below in the visit note. 

## 2014-01-11 NOTE — Patient Instructions (Signed)

## 2015-02-18 ENCOUNTER — Encounter: Payer: Self-pay | Admitting: Adult Health

## 2015-02-18 ENCOUNTER — Ambulatory Visit (INDEPENDENT_AMBULATORY_CARE_PROVIDER_SITE_OTHER): Payer: BLUE CROSS/BLUE SHIELD | Admitting: Adult Health

## 2015-02-18 ENCOUNTER — Emergency Department (HOSPITAL_COMMUNITY): Payer: BLUE CROSS/BLUE SHIELD

## 2015-02-18 ENCOUNTER — Emergency Department (HOSPITAL_COMMUNITY)
Admission: EM | Admit: 2015-02-18 | Discharge: 2015-02-19 | Disposition: A | Discharge status: Home / Self Care | Payer: BLUE CROSS/BLUE SHIELD | Attending: Emergency Medicine | Admitting: Emergency Medicine

## 2015-02-18 ENCOUNTER — Encounter (HOSPITAL_COMMUNITY): Payer: Self-pay | Admitting: Emergency Medicine

## 2015-02-18 VITALS — BP 102/70 | Temp 98.1°F | Ht 61.0 in | Wt 137.2 lb

## 2015-02-18 DIAGNOSIS — Z720 Tobacco use: Secondary | ICD-10-CM | POA: Diagnosis not present

## 2015-02-18 DIAGNOSIS — J45909 Unspecified asthma, uncomplicated: Secondary | ICD-10-CM | POA: Diagnosis not present

## 2015-02-18 DIAGNOSIS — Z3202 Encounter for pregnancy test, result negative: Secondary | ICD-10-CM | POA: Insufficient documentation

## 2015-02-18 DIAGNOSIS — R1031 Right lower quadrant pain: Secondary | ICD-10-CM | POA: Insufficient documentation

## 2015-02-18 DIAGNOSIS — E669 Obesity, unspecified: Secondary | ICD-10-CM | POA: Diagnosis not present

## 2015-02-18 DIAGNOSIS — Z79899 Other long term (current) drug therapy: Secondary | ICD-10-CM | POA: Insufficient documentation

## 2015-02-18 DIAGNOSIS — Z8659 Personal history of other mental and behavioral disorders: Secondary | ICD-10-CM | POA: Insufficient documentation

## 2015-02-18 DIAGNOSIS — K625 Hemorrhage of anus and rectum: Secondary | ICD-10-CM | POA: Diagnosis not present

## 2015-02-18 LAB — POCT URINE PREGNANCY: PREG TEST UR: NEGATIVE

## 2015-02-18 LAB — CBC
HEMATOCRIT: 41.1 % (ref 36.0–46.0)
Hemoglobin: 14.3 g/dL (ref 12.0–15.0)
MCH: 30.7 pg (ref 26.0–34.0)
MCHC: 34.8 g/dL (ref 30.0–36.0)
MCV: 88.2 fL (ref 78.0–100.0)
Platelets: 246 10*3/uL (ref 150–400)
RBC: 4.66 MIL/uL (ref 3.87–5.11)
RDW: 11.9 % (ref 11.5–15.5)
WBC: 9.7 10*3/uL (ref 4.0–10.5)

## 2015-02-18 LAB — COMPREHENSIVE METABOLIC PANEL
ALT: 33 U/L (ref 14–54)
ANION GAP: 7 (ref 5–15)
AST: 27 U/L (ref 15–41)
Albumin: 4.3 g/dL (ref 3.5–5.0)
Alkaline Phosphatase: 51 U/L (ref 38–126)
BUN: 14 mg/dL (ref 6–20)
CHLORIDE: 107 mmol/L (ref 101–111)
CO2: 26 mmol/L (ref 22–32)
Calcium: 9 mg/dL (ref 8.9–10.3)
Creatinine, Ser: 0.7 mg/dL (ref 0.44–1.00)
Glucose, Bld: 75 mg/dL (ref 65–99)
Potassium: 3.8 mmol/L (ref 3.5–5.1)
Sodium: 140 mmol/L (ref 135–145)
TOTAL PROTEIN: 7.7 g/dL (ref 6.5–8.1)
Total Bilirubin: 0.5 mg/dL (ref 0.3–1.2)

## 2015-02-18 LAB — POCT URINALYSIS DIPSTICK
Bilirubin, UA: NEGATIVE
Glucose, UA: NEGATIVE
Ketones, UA: NEGATIVE
LEUKOCYTES UA: NEGATIVE
NITRITE UA: NEGATIVE
PH UA: 6
PROTEIN UA: NEGATIVE
RBC UA: NEGATIVE
SPEC GRAV UA: 1.025
UROBILINOGEN UA: 0.2

## 2015-02-18 LAB — PREGNANCY, URINE: PREG TEST UR: NEGATIVE

## 2015-02-18 LAB — URINALYSIS, ROUTINE W REFLEX MICROSCOPIC
Bilirubin Urine: NEGATIVE
GLUCOSE, UA: NEGATIVE mg/dL
Hgb urine dipstick: NEGATIVE
Ketones, ur: NEGATIVE mg/dL
Leukocytes, UA: NEGATIVE
NITRITE: NEGATIVE
Protein, ur: NEGATIVE mg/dL
SPECIFIC GRAVITY, URINE: 1.02 (ref 1.005–1.030)
Urobilinogen, UA: 0.2 mg/dL (ref 0.0–1.0)
pH: 6 (ref 5.0–8.0)

## 2015-02-18 LAB — LIPASE, BLOOD: LIPASE: 13 U/L — AB (ref 22–51)

## 2015-02-18 LAB — POC OCCULT BLOOD, ED: Fecal Occult Bld: POSITIVE — AB

## 2015-02-18 MED ORDER — IOHEXOL 300 MG/ML  SOLN
100.0000 mL | Freq: Once | INTRAMUSCULAR | Status: AC | PRN
  Administered 2015-02-18: 100 mL via INTRAVENOUS

## 2015-02-18 MED ORDER — SODIUM CHLORIDE 0.9 % IV BOLUS (SEPSIS)
1000.0000 mL | Freq: Once | INTRAVENOUS | Status: AC
  Administered 2015-02-18: 1000 mL via INTRAVENOUS

## 2015-02-18 MED ORDER — IOHEXOL 300 MG/ML  SOLN
25.0000 mL | Freq: Once | INTRAMUSCULAR | Status: AC | PRN
  Administered 2015-02-18: 25 mL via ORAL

## 2015-02-18 NOTE — Progress Notes (Signed)
.  lbpmch

## 2015-02-18 NOTE — ED Provider Notes (Signed)
CSN: [id number]     Arrival date & time 02/18/15  1735 History   First MD Initiated Contact with Patient 02/18/15 2000     Chief Complaint  Patient presents with  . Abdominal Pain  . Blood In Stools     (Consider location/radiation/quality/duration/timing/severity/associated sxs/prior Treatment) HPI Comments: Patient presents to the emergency department with chief complaint of right lower quadrant pain. She was seen today by her primary care provider, and was sent to the emergency department to rule out appendicitis. Patient states that the pain is a 3-4 out of 10. The pain is intermittent, and is sharp in nature. She has not tried taking anything to alleviate the symptoms. She also reports associated bright red blood per rectum. She denies any fevers, chills, nausea, vomiting, diarrhea, or constipation. She states that she had a rectal exam by her primary care provider which was unremarkable. She states that they also used and anoscope, and cannot visualize any fissures or internal hemorrhoids.  The history is provided by the patient. No language interpreter was used.    Past Medical History  Diagnosis Date  . Depression   . Bipolar disorder   . Headache(784.0)   . Asthma   . Obesity   . Personality disorder    Past Surgical History  Procedure Laterality Date  . Wisdom tooth extraction  Age 71   Family History  Problem Relation Age of Onset  . Adopted: Yes  . Alcohol abuse Mother   . Alcohol abuse Father    History  Substance Use Topics  . Smoking status: Current Every Day Smoker -- 0.50 packs/day for .8 years    Types: Cigarettes  . Smokeless tobacco: Never Used     Comment: smokes 1-2 a day  . Alcohol Use: Yes   OB History    No data available     Review of Systems  Constitutional: Negative for fever and chills.  Respiratory: Negative for shortness of breath.   Cardiovascular: Negative for chest pain.  Gastrointestinal: Positive for abdominal pain and blood in  stool. Negative for nausea, vomiting, diarrhea and constipation.  Genitourinary: Negative for dysuria.  All other systems reviewed and are negative.     Allergies  Zolpidem tartrate  Home Medications   Prior to Admission medications   Medication Sig Start Date End Date Taking? Authorizing Provider  albuterol (PROVENTIL HFA;VENTOLIN HFA) 108 (90 BASE) MCG/ACT inhaler Inhale 2 puffs into the lungs every 6 (six) hours as needed. For shortness of breath. 02/13/13  Yes Burnis Medin, MD  ibuprofen (ADVIL,MOTRIN) 200 MG tablet Take 600 mg by mouth every 6 (six) hours as needed for moderate pain.   Yes Historical Provider, MD   BP 111/76 mmHg  Pulse 72  Temp(Src) 97.9 F (36.6 C) (Oral)  Resp 19  SpO2 97%  LMP 01/28/2015 Physical Exam  Constitutional: She is oriented to person, place, and time. She appears well-developed and well-nourished.  HENT:  Head: Normocephalic and atraumatic.  Eyes: Conjunctivae and EOM are normal. Pupils are equal, round, and reactive to light.  Neck: Normal range of motion. Neck supple.  Cardiovascular: Normal rate and regular rhythm.  Exam reveals no gallop and no friction rub.   No murmur heard. Pulmonary/Chest: Effort normal and breath sounds normal. No respiratory distress. She has no wheezes. She has no rales. She exhibits no tenderness.  Abdominal: Soft. Bowel sounds are normal. She exhibits no distension and no mass. There is no tenderness. There is no rebound and no guarding.  Right lower quadrant is moderately tender to palpation, a positive Rovsing sign is also noted, there is no other focal abdominal tenderness  Musculoskeletal: Normal range of motion. She exhibits no edema or tenderness.  Neurological: She is alert and oriented to person, place, and time.  Skin: Skin is warm and dry.  Psychiatric: She has a normal mood and affect. Her behavior is normal. Judgment and thought content normal.  Nursing note and vitals reviewed.   ED Course   Procedures (including critical care time) Results for orders placed or performed during the hospital encounter of 02/18/15  Lipase, blood  Result Value Ref Range   Lipase 13 (L) 22 - 51 U/L  Comprehensive metabolic panel  Result Value Ref Range   Sodium 140 135 - 145 mmol/L   Potassium 3.8 3.5 - 5.1 mmol/L   Chloride 107 101 - 111 mmol/L   CO2 26 22 - 32 mmol/L   Glucose, Bld 75 65 - 99 mg/dL   BUN 14 6 - 20 mg/dL   Creatinine, Ser 0.70 0.44 - 1.00 mg/dL   Calcium 9.0 8.9 - 10.3 mg/dL   Total Protein 7.7 6.5 - 8.1 g/dL   Albumin 4.3 3.5 - 5.0 g/dL   AST 27 15 - 41 U/L   ALT 33 14 - 54 U/L   Alkaline Phosphatase 51 38 - 126 U/L   Total Bilirubin 0.5 0.3 - 1.2 mg/dL   GFR calc non Af Amer >60 >60 mL/min   GFR calc Af Amer >60 >60 mL/min   Anion gap 7 5 - 15  CBC  Result Value Ref Range   WBC 9.7 4.0 - 10.5 K/uL   RBC 4.66 3.87 - 5.11 MIL/uL   Hemoglobin 14.3 12.0 - 15.0 g/dL   HCT 41.1 36.0 - 46.0 %   MCV 88.2 78.0 - 100.0 fL   MCH 30.7 26.0 - 34.0 pg   MCHC 34.8 30.0 - 36.0 g/dL   RDW 11.9 11.5 - 15.5 %   Platelets 246 150 - 400 K/uL  Urinalysis, Routine w reflex microscopic (not at [facility])  Result Value Ref Range   Color, Urine YELLOW YELLOW   APPearance CLEAR CLEAR   Specific Gravity, Urine 1.020 1.005 - 1.030   pH 6.0 5.0 - 8.0   Glucose, UA NEGATIVE NEGATIVE mg/dL   Hgb urine dipstick NEGATIVE NEGATIVE   Bilirubin Urine NEGATIVE NEGATIVE   Ketones, ur NEGATIVE NEGATIVE mg/dL   Protein, ur NEGATIVE NEGATIVE mg/dL   Urobilinogen, UA 0.2 0.0 - 1.0 mg/dL   Nitrite NEGATIVE NEGATIVE   Leukocytes, UA NEGATIVE NEGATIVE  Pregnancy, urine  Result Value Ref Range   Preg Test, Ur NEGATIVE NEGATIVE  POC occult blood, ED RN will collect  Result Value Ref Range   Fecal Occult Bld POSITIVE (A) NEGATIVE   Ct Abdomen Pelvis W Contrast  02/19/2015   CLINICAL DATA:  19 year old female with right lower quadrant abdominal pain  EXAM: CT ABDOMEN AND PELVIS WITH CONTRAST   TECHNIQUE: Multidetector CT imaging of the abdomen and pelvis was performed using the standard protocol following bolus administration of intravenous contrast.  CONTRAST:  63mL OMNIPAQUE IOHEXOL 300 MG/ML SOLN, 146mL OMNIPAQUE IOHEXOL 300 MG/ML SOLN  COMPARISON:  None.  FINDINGS: the visualized lung bases are clear. No intra-abdominal free air. Small free fluid within the pelvis.  The liver, gallbladder, pancreas, spleen, adrenal glands, kidneys, visualized ureters, and urinary bladder appear unremarkable. The uterus is anteverted and grossly unremarkable. There is a 2 cm right ovarian  dominant follicle with apparent rupture. Ultrasound may provide better evaluation of the pelvic structures.  Moderate stool throughout the colon. No evidence of bowel obstruction or inflammation. Normal appendix.  The abdominal aorta and IVC appear patent. No portal venous gas identified. There is no lymphadenopathy. The the osseous structures are unremarkable.  IMPRESSION: A 2 cm dominant right ovarian follicle with possible rupture.  No evidence of bowel obstruction or inflammation.  Normal appendix.   Electronically Signed   By: Anner Crete M.D.   On: 02/19/2015 00:00      EKG Interpretation None      MDM   Final diagnoses:  RLQ abdominal pain  RLQ abdominal pain    Patient with right lower quadrant pain and rectal bleeding. Sent to ED by PCP for appendicitis rule out. Labs are largely reassuring. Urinalysis negative.  Pregnancy test is pending. Electrolytes and CBC are normal.  preg negative.  CT remarkable for 2 cm right ovarian follicle.  Patient is very well appearing.  Will discharge to home with PCP follow-up.  Patient understands and agrees with the plan.    Montine Circle, PA-C 02/19/15 [id number]  Orpah Greek, MD 02/19/15 [phone]

## 2015-02-18 NOTE — ED Notes (Signed)
Pt states she was seen by PCP for blood in stool and RLQ abdominal pain today. PCP did not see hemmrhoids and sent pt in for further eval and r/o appendicitis. Alert and oriented. Afebrile.

## 2015-02-18 NOTE — Patient Instructions (Addendum)
Our exam today showed blood in your stool. I did not see any hemorrhoids or other causes of bleeding at this time. You also had right lower quadrant pain. You will need a further work up to rule out appendicitis.I t is advised that you go to the ER now to have this work up completed. Abdominal Pain Many things can cause abdominal pain. Usually, abdominal pain is not caused by a disease and will improve without treatment. It can often be observed and treated at home. Your health care provider will do a physical exam and possibly order blood tests and X-rays to help determine the seriousness of your pain. However, in many cases, more time must pass before a clear cause of the pain can be found. Before that point, your health care provider may not know if you need more testing or further treatment. HOME CARE INSTRUCTIONS  Monitor your abdominal pain for any changes. The following actions may help to alleviate any discomfort you are experiencing:  Only take over-the-counter or prescription medicines as directed by your health care provider.  Do not take laxatives unless directed to do so by your health care provider.  Try a clear liquid diet (broth, tea, or water) as directed by your health care provider. Slowly move to a bland diet as tolerated. SEEK MEDICAL CARE IF:  You have unexplained abdominal pain.  You have abdominal pain associated with nausea or diarrhea.  You have pain when you urinate or have a bowel movement.  You experience abdominal pain that wakes you in the night.  You have abdominal pain that is worsened or improved by eating food.  You have abdominal pain that is worsened with eating fatty foods.  You have a fever. SEEK IMMEDIATE MEDICAL CARE IF:   Your pain does not go away within 2 hours.  You keep throwing up (vomiting).  Your pain is felt only in portions of the abdomen, such as the right side or the left lower portion of the abdomen.  You pass bloody or black  tarry stools. MAKE SURE YOU:  Understand these instructions.   Will watch your condition.   Will get help right away if you are not doing well or get worse.  Document Released: 05/05/2005 Document Revised: 07/31/2013 Document Reviewed: 04/04/2013 [facility] Patient Information 2015 [place], [place]. This information is not intended to replace advice given to you by your health care provider. Make sure you discuss any questions you have with your health care provider.

## 2015-02-18 NOTE — ED Notes (Signed)
Call lab to add on pregnancy urine. Pregnancy urine added.

## 2015-02-18 NOTE — ED Notes (Signed)
Requested patient to urinate. Patient does not have to urinate at this time. 

## 2015-02-18 NOTE — Progress Notes (Signed)
Subjective:    Patient ID: Candace Shaw, female    DOB: 19-Jan-1996, 19 y.o.   MRN: [id number]  HPI 19 year old female who presents tpo the office today with right lower abdominal pain with associated nausea, objective fever, generalized malaise. On 02/16/2015 she had one episode of BRBPR and another this morning at 5 am - both were normal BM. Denies any diarrhea or constipation. She does not have any rectal pain and having a BM does not cause pain.   She is sexually active ( denies anal sex), does not use birth control but does use condooms, her LMS was 3 weeks ago.    Review of Systems  Constitutional: Positive for fever and fatigue. Negative for chills, activity change, appetite change and unexpected weight change.  Gastrointestinal: Positive for blood in stool. Negative for nausea, vomiting, diarrhea, constipation, abdominal distention and rectal pain.  Genitourinary: Negative.   Musculoskeletal: Negative.   Skin: Negative.   Neurological: Negative for dizziness, weakness, light-headedness and headaches.   Past Medical History  Diagnosis Date  . Depression   . Bipolar disorder   . Headache(784.0)   . Asthma   . Obesity   . Personality disorder     History   Social History  . Marital Status: Single    Spouse Name: N/A  . Number of Children: N/A  . Years of Education: N/A   Occupational History  . Not on file.   Social History Main Topics  . Smoking status: Current Every Day Smoker -- 0.50 packs/day for .8 years    Types: Cigarettes  . Smokeless tobacco: Never Used     Comment: smokes 1-2 a day  . Alcohol Use: Yes  . Drug Use: Yes    Special: Marijuana, Cocaine     Comment: Acid, mushrooms, DNT  . Sexual Activity:    Partners: Female    Museum/gallery curator: Condom, OCP, Pill   Other Topics Concern  . Not on file   Social History Narrative   Legal Guardians: Herbie [place] and [place] [employer] [place]     Past Surgical History  Procedure Laterality Date  . Wisdom tooth extraction  Age 35    Family History  Problem Relation Age of Onset  . Adopted: Yes  . Alcohol abuse Mother   . Alcohol abuse Father     Allergies  Allergen Reactions  . Zolpidem Tartrate Other (See Comments)    Hallucinations    Current Outpatient Prescriptions on File Prior to Visit  Medication Sig Dispense Refill  . albuterol (PROVENTIL HFA;VENTOLIN HFA) 108 (90 BASE) MCG/ACT inhaler Inhale 2 puffs into the lungs every 6 (six) hours as needed. For shortness of breath. 1 Inhaler 2   No current facility-administered medications on file prior to visit.    BP 102/70 mmHg  Temp(Src) 98.1 F (36.7 C) (Oral)  Ht 5\' 1"  (1.549 m)  Wt 137 lb 3.2 oz (62.234 kg)  BMI 25.94 kg/m2  LMP 01/28/2015       Objective:   Physical Exam  Constitutional: She is oriented to person, place, and time. She appears well-developed and well-nourished. No distress.  Cardiovascular: Normal rate, regular rhythm, normal heart sounds and intact distal pulses.  Exam reveals no gallop and no friction rub.   No murmur heard. Pulmonary/Chest: Effort normal and breath sounds normal. No respiratory distress. She has no wheezes. She has  no rales. She exhibits no tenderness.  Abdominal: Soft. Bowel sounds are normal. She exhibits no distension and no mass. There is tenderness. There is no rebound and no guarding.  Pain with palpation at McBurneys point No Rovings sign No Obturator Sign  No Psoas Sign  Genitourinary: Guaiac positive stool.  No gross blood in rectum. No signs or symptoms of trauma. No hemorrhoids noticed  Neurological: She is alert and oriented to person, place, and time.  Skin: Skin is warm and dry. No rash noted. She is not diaphoretic. No erythema. No pallor.  Psychiatric: She has a normal mood and affect. Her behavior is normal. Judgment and thought content normal.  Nursing note and vitals reviewed.     Assessment  & Plan:  1. Right lower quadrant abdominal pain - Appendicitis vs ruptured ovarian cyst vs Mittelschmerz vs ovarian torsion - Requires further workup in the ER and she is agreeable to this.  - Patient will drive herself, she does not want to go by EMS - POC Urinalysis Dipstick- Negative - POCT urine pregnancy- Negative  2. Bright red blood per rectum - Guaiac positive.  - Requires further workup in the ER

## 2015-02-19 NOTE — Discharge Instructions (Signed)
Abdominal Pain Many things can cause abdominal pain. Usually, abdominal pain is not caused by a disease and will improve without treatment. It can often be observed and treated at home. Your health care provider will do a physical exam and possibly order blood tests and X-rays to help determine the seriousness of your pain. However, in many cases, more time must pass before a clear cause of the pain can be found. Before that point, your health care provider may not know if you need more testing or further treatment. HOME CARE INSTRUCTIONS  Monitor your abdominal pain for any changes. The following actions may help to alleviate any discomfort you are experiencing:  Only take over-the-counter or prescription medicines as directed by your health care provider.  Do not take laxatives unless directed to do so by your health care provider.  Try a clear liquid diet (broth, tea, or water) as directed by your health care provider. Slowly move to a bland diet as tolerated. SEEK MEDICAL CARE IF:  You have unexplained abdominal pain.  You have abdominal pain associated with nausea or diarrhea.  You have pain when you urinate or have a bowel movement.  You experience abdominal pain that wakes you in the night.  You have abdominal pain that is worsened or improved by eating food.  You have abdominal pain that is worsened with eating fatty foods.  You have a fever. SEEK IMMEDIATE MEDICAL CARE IF:   Your pain does not go away within 2 hours.  You keep throwing up (vomiting).  Your pain is felt only in portions of the abdomen, such as the right side or the left lower portion of the abdomen.  You pass bloody or black tarry stools. MAKE SURE YOU:  Understand these instructions.   Will watch your condition.   Will get help right away if you are not doing well or get worse.  Document Released: 05/05/2005 Document Revised: 07/31/2013 Document Reviewed: 04/04/2013 [facility] Patient Information  2015 [place], [place]. This information is not intended to replace advice given to you by your health care provider. Make sure you discuss any questions you have with your health care provider. Rectal Bleeding Rectal bleeding is when blood passes out of the anus. It is usually a sign that something is wrong. It may not be serious, but it should always be evaluated. Rectal bleeding may present as bright red blood or extremely dark stools. The color may range from dark red or maroon to black (like tar). It is important that the cause of rectal bleeding be identified so treatment can be started and the problem corrected. CAUSES   Hemorrhoids. These are enlarged (dilated) blood vessels or veins in the anal or rectal area.  Fistulas. Theseare abnormal, burrowing channels that usually run from inside the rectum to the skin around the anus. They can bleed.  Anal fissures. This is a tear in the tissue of the anus. Bleeding occurs with bowel movements.  Diverticulosis. This is a condition in which pockets or sacs project from the bowel wall. Occasionally, the sacs can bleed.  Diverticulitis. Thisis an infection involving diverticulosis of the colon.  Proctitis and colitis. These are conditions in which the rectum, colon, or both, can become inflamed and pitted (ulcerated).  Polyps and cancer. Polyps are non-cancerous (benign) growths in the colon that may bleed. Certain types of polyps turn into cancer.  Protrusion of the rectum. Part of the rectum can project from the anus and bleed.  Certain medicines.  Intestinal infections.  Blood vessel abnormalities. HOME CARE INSTRUCTIONS  Eat a high-fiber diet to keep your stool soft.  Limit activity.  Drink enough fluids to keep your urine clear or pale yellow.  Warm baths may be useful to soothe rectal pain.  Follow up with your caregiver as directed. SEEK IMMEDIATE MEDICAL CARE IF:  You develop increased bleeding.  You have black or dark  red stools.  You vomit blood or material that looks like coffee grounds.  You have abdominal pain or tenderness.  You have a fever.  You feel weak, nauseous, or you faint.  You have severe rectal pain or you are unable to have a bowel movement. MAKE SURE YOU:  Understand these instructions.  Will watch your condition.  Will get help right away if you are not doing well or get worse. Document Released: 01/15/2002 Document Revised: 10/18/2011 Document Reviewed: 01/10/2011 [facility] Patient Information 2015 [place], [place]. This information is not intended to replace advice given to you by your health care provider. Make sure you discuss any questions you have with your health care provider.

## 2015-03-28 ENCOUNTER — Ambulatory Visit (INDEPENDENT_AMBULATORY_CARE_PROVIDER_SITE_OTHER): Payer: BLUE CROSS/BLUE SHIELD | Admitting: Internal Medicine

## 2015-03-28 ENCOUNTER — Other Ambulatory Visit (HOSPITAL_COMMUNITY)
Admission: RE | Admit: 2015-03-28 | Discharge: 2015-03-28 | Disposition: A | Discharge status: Home / Self Care | Payer: BLUE CROSS/BLUE SHIELD | Source: Ambulatory Visit | Attending: Internal Medicine | Admitting: Internal Medicine

## 2015-03-28 ENCOUNTER — Encounter: Payer: Self-pay | Admitting: Internal Medicine

## 2015-03-28 VITALS — BP 110/66 | Temp 98.6°F | Ht 61.0 in | Wt 139.9 lb

## 2015-03-28 DIAGNOSIS — Z01419 Encounter for gynecological examination (general) (routine) without abnormal findings: Secondary | ICD-10-CM

## 2015-03-28 DIAGNOSIS — F1911 Other psychoactive substance abuse, in remission: Secondary | ICD-10-CM | POA: Insufficient documentation

## 2015-03-28 DIAGNOSIS — N941 Dyspareunia: Secondary | ICD-10-CM | POA: Diagnosis not present

## 2015-03-28 DIAGNOSIS — N760 Acute vaginitis: Secondary | ICD-10-CM | POA: Diagnosis not present

## 2015-03-28 DIAGNOSIS — N83201 Unspecified ovarian cyst, right side: Secondary | ICD-10-CM

## 2015-03-28 DIAGNOSIS — R1031 Right lower quadrant pain: Secondary | ICD-10-CM

## 2015-03-28 DIAGNOSIS — Z113 Encounter for screening for infections with a predominantly sexual mode of transmission: Secondary | ICD-10-CM | POA: Diagnosis present

## 2015-03-28 DIAGNOSIS — N8320 Unspecified ovarian cysts: Secondary | ICD-10-CM

## 2015-03-28 DIAGNOSIS — IMO0002 Reserved for concepts with insufficient information to code with codable children: Secondary | ICD-10-CM

## 2015-03-28 MED ORDER — LEVONORGESTREL-ETHINYL ESTRAD 0.1-20 MG-MCG PO TABS: 1.0000 | ORAL_TABLET | Freq: Every day | ORAL | Status: DC

## 2015-03-28 MED ORDER — FLUCONAZOLE 150 MG PO TABS: 150.0000 mg | ORAL_TABLET | Freq: Once | ORAL | Status: DC

## 2015-03-28 NOTE — Patient Instructions (Addendum)
Your sx are probably from  Ovarian cyst  Based on   Your ct and  Exam   Will do gyne referral and advise porb need for Ultrasound   Of  Ovaries   OCPS can suppress  Cysts and may help you pain .   Use condoms all the time.  Will notify you  of labs when available. Looks like could be yeast. Will send in pill for yeast.  Ovarian Cyst An ovarian cyst is a fluid-filled sac that forms on an ovary. The ovaries are small organs that produce eggs in women. Various types of cysts can form on the ovaries. Most are not cancerous. Many do not cause problems, and they often go away on their own. Some may cause symptoms and require treatment. Common types of ovarian cysts include:  Functional cysts--These cysts may occur every month during the menstrual cycle. This is normal. The cysts usually go away with the next menstrual cycle if the woman does not get pregnant. Usually, there are no symptoms with a functional cyst.  Endometrioma cysts--These cysts form from the tissue that lines the uterus. They are also called "chocolate cysts" because they become filled with blood that turns brown. This type of cyst can cause pain in the lower abdomen during intercourse and with your menstrual period.  Cystadenoma cysts--This type develops from the cells on the outside of the ovary. These cysts can get very big and cause lower abdomen pain and pain with intercourse. This type of cyst can twist on itself, cut off its blood supply, and cause severe pain. It can also easily rupture and cause a lot of pain.  Dermoid cysts--This type of cyst is sometimes found in both ovaries. These cysts may contain different kinds of body tissue, such as skin, teeth, hair, or cartilage. They usually do not cause symptoms unless they get very big.  Theca lutein cysts--These cysts occur when too much of a certain hormone (human chorionic gonadotropin) is produced and overstimulates the ovaries to produce an egg. This is most common  after procedures used to assist with the conception of a baby (in vitro fertilization). CAUSES   Fertility drugs can cause a condition in which multiple large cysts are formed on the ovaries. This is called ovarian hyperstimulation syndrome.  A condition called polycystic ovary syndrome can cause hormonal imbalances that can lead to nonfunctional ovarian cysts. SIGNS AND SYMPTOMS  Many ovarian cysts do not cause symptoms. If symptoms are present, they may include:  Pelvic pain or pressure.  Pain in the lower abdomen.  Pain during sexual intercourse.  Increasing girth (swelling) of the abdomen.  Abnormal menstrual periods.  Increasing pain with menstrual periods.  Stopping having menstrual periods without being pregnant. DIAGNOSIS  These cysts are commonly found during a routine or annual pelvic exam. Tests may be ordered to find out more about the cyst. These tests may include:  Ultrasound.  X-ray of the pelvis.  CT scan.  MRI.  Blood tests. TREATMENT  Many ovarian cysts go away on their own without treatment. Your health care provider may want to check your cyst regularly for 2-3 months to see if it changes. For women in menopause, it is particularly important to monitor a cyst closely because of the higher rate of ovarian cancer in menopausal women. When treatment is needed, it may include any of the following:  A procedure to drain the cyst (aspiration). This may be done using a long needle and ultrasound. It can also be  done through a laparoscopic procedure. This involves using a thin, lighted tube with a tiny camera on the end (laparoscope) inserted through a small incision.  Surgery to remove the whole cyst. This may be done using laparoscopic surgery or an open surgery involving a larger incision in the lower abdomen.  Hormone treatment or birth control pills. These methods are sometimes used to help dissolve a cyst. HOME CARE INSTRUCTIONS   Only take  over-the-counter or prescription medicines as directed by your health care provider.  Follow up with your health care provider as directed.  Get regular pelvic exams and Pap tests. SEEK MEDICAL CARE IF:   Your periods are late, irregular, or painful, or they stop.  Your pelvic pain or abdominal pain does not go away.  Your abdomen becomes larger or swollen.  You have pressure on your bladder or trouble emptying your bladder completely.  You have pain during sexual intercourse.  You have feelings of fullness, pressure, or discomfort in your stomach.  You lose weight for no apparent reason.  You feel generally ill.  You become constipated.  You lose your appetite.  You develop acne.  You have an increase in body and facial hair.  You are gaining weight, without changing your exercise and eating habits.  You think you are pregnant. SEEK IMMEDIATE MEDICAL CARE IF:   You have increasing abdominal pain.  You feel sick to your stomach (nauseous), and you throw up (vomit).  You develop a fever that comes on suddenly.  You have abdominal pain during a bowel movement.  Your menstrual periods become heavier than usual. MAKE SURE YOU:  Understand these instructions.  Will watch your condition.  Will get help right away if you are not doing well or get worse. Document Released: 07/26/2005 Document Revised: 07/31/2013 Document Reviewed: 04/02/2013 [facility] Patient Information 2015 [place], [place]. This information is not intended to replace advice given to you by your health care provider. Make sure you discuss any questions you have with your health care provider.

## 2015-03-28 NOTE — Progress Notes (Signed)
Pre visit review using our clinic review tool, if applicable. No additional management support is needed unless otherwise documented below in the visit note.  Chief Complaint  Patient presents with  . ED Follow Up    HPI: Candace Shaw 19 y.o. comes in for fu of ed viosot in  [facility] July for abd pelvic pain . She states that she has had some off and on right lower quadrant pain since that time did gotten better for about a week and then came back but not as severe. She also reports dyspareunia with pain mid and right deep. Uncertain how long this has been going on. One partner for a year condoms off and on. States she has had a Pap in the past at a gynecology office. Has had what she calls a yeast infection with vaginal discharge itching irritation. She treated with an over-the-counter generic and still has some symptoms. Denies UTI symptoms lesions. No fever UTI symptoms flank pain.   ?  lmpo due in a week.  Beginning of august   ROS: See pertinent positives and negatives per HPI. Tends to had a sensitive stomach at some point had blood in her stool and this was documented in the ED but doesn't have it now. No diarrhea or unusual weight loss. States that she has been sober for a while and is off all medicines and doing well. Will become a liter soon. In the past she thinks that OCPs may have affected her mood. But she was just coming off medication. She is still smoking half a pack per day.  Past Medical History  Diagnosis Date  . Depression   . Bipolar disorder   . Headache(784.0)   . Asthma   . Obesity   . Personality disorder   . Hallucinogen dependence, unspecified 10/03/2012    Family History  Problem Relation Age of Onset  . Adopted: Yes  . Alcohol abuse Mother   . Alcohol abuse Father     Social History   Social History  . Marital Status: Single    Spouse Name: N/A  . Number of Children: N/A  . Years of Education: N/A   Social History Main Topics  . Smoking  status: Current Every Day Smoker -- 0.50 packs/day for .8 years    Types: Cigarettes  . Smokeless tobacco: Never Used     Comment: smokes 1-2 a day  . Alcohol Use: Yes  . Drug Use: Yes    Special: Marijuana, Cocaine     Comment: Acid, mushrooms, DNT  . Sexual Activity:    Partners: Female    Museum/gallery curator: Condom, OCP, Pill   Other Topics Concern  . None   Social History Narrative   Legal Guardians: [place] and [place]    Outpatient Prescriptions Prior to Visit  Medication Sig Dispense Refill  . albuterol (PROVENTIL HFA;VENTOLIN HFA) 108 (90 BASE) MCG/ACT inhaler Inhale 2 puffs into the lungs every 6 (six) hours as needed. For shortness of breath. 1 Inhaler 2  . ibuprofen (ADVIL,MOTRIN) 200 MG tablet Take 600 mg by mouth every 6 (six) hours as needed for moderate pain.     No facility-administered medications prior to visit.     EXAM:  BP 110/66 mmHg  Temp(Src) 98.6 F (37 C) (Oral)  Ht 5\' 1"  (1.549 m)  Wt 139 lb 14.4 oz (63.458 kg)  BMI 26.45 kg/m2  Body mass index is 26.45 kg/(m^2).  GENERAL: vitals reviewed and listed above, alert, oriented, appears well hydrated and in no acute distress HEENT: atraumatic, conjunctiva  clear, no obvious abnormalities on inspection of external nose and ears OP : no lesion edema or exudate  NECK: no obvious masses on inspection palpation  LUNGS: clear to auscultation bilaterally, no wheezes, rales or rhonchi, good air movement CV: HRRR, no clubbing cyanosis or  peripheral edema nl cap refill  Abdomen soft no obvious masses but she has some tenderness in the right lower quadrant some guarding no rebound no psoas sign.  MS: moves all extremities without noticeable focal  abnormality Pelvic exam external GU normal except for white discharge cervix os clear +2 white discharge some red mucosa pap done GC Chlamydia wet prep screen done bimanual mild CMT  tenderness and right adnexal area.   ASSESSMENT AND PLAN:  Discussed the following assessment and plan:  Right lower quadrant abdominal pain - Plan: PAP [[place]]  Cyst of right ovary  Dyspareunia  Vaginitis and vulvovaginitis - Plan: PAP [[place]]  Encounter for routine gynecological examination - Plan: PAP [[place]]  Substance abuse in remission   Discussed adding ovarian suppression with OCP hopefully will not aggravate mood disorder will change medication Avion 1 a day until she sees gynecologist. Continue to use condoms all the time STD screen empiric treatment for yeast vaginitis. Expectant management. May require follow-up ultrasound for further delineation and problem with her pain .  Will get back with [place] in the meantime if plan is not working.   bnot sure what to make of the blood instool episode    Get gyne eval first .  No evidence of colitis on ct  -Patient advised to return or notify health care team  if symptoms worsen ,persist or new concerns arise.  Patient Instructions   Your sx are probably from  Ovarian cyst  Based on   Your ct and  Exam   Will do gyne referral and advise porb need for Ultrasound   Of  Ovaries   OCPS can suppress  Cysts and may help you pain .   Use condoms all the time.  Will notify you  of labs when available. Looks like could be yeast. Will send in pill for yeast.  Ovarian Cyst An ovarian cyst is a fluid-filled sac that forms on an ovary. The ovaries are small organs that produce eggs in women. Various types of cysts can form on the ovaries. Most are not cancerous. Many do not cause problems, and they often go away on their own. Some may cause symptoms and require treatment. Common types of ovarian cysts include:  Functional cysts--These cysts may occur every month during the menstrual cycle. This is normal. The cysts usually go away with the next menstrual cycle if the woman does not get pregnant. Usually, there are no  symptoms with a functional cyst.  Endometrioma cysts--These cysts form from the tissue that lines the uterus. They are also called "chocolate cysts" because they become filled with blood that turns brown. This type of cyst can cause pain in the lower abdomen during intercourse and with your menstrual period.  Cystadenoma cysts--This type develops from the cells on the outside of the ovary. These cysts can get very big and cause lower abdomen pain and pain with intercourse. This type of cyst can twist on itself, cut off its blood supply, and cause severe pain. It can also  easily rupture and cause a lot of pain.  Dermoid cysts--This type of cyst is sometimes found in both ovaries. These cysts may contain different kinds of body tissue, such as skin, teeth, hair, or cartilage. They usually do not cause symptoms unless they get very big.  Theca lutein cysts--These cysts occur when too much of a certain hormone (human chorionic gonadotropin) is produced and overstimulates the ovaries to produce an egg. This is most common after procedures used to assist with the conception of a baby (in vitro fertilization). CAUSES   Fertility drugs can cause a condition in which multiple large cysts are formed on the ovaries. This is called ovarian hyperstimulation syndrome.  A condition called polycystic ovary syndrome can cause hormonal imbalances that can lead to nonfunctional ovarian cysts. SIGNS AND SYMPTOMS  Many ovarian cysts do not cause symptoms. If symptoms are present, they may include:  Pelvic pain or pressure.  Pain in the lower abdomen.  Pain during sexual intercourse.  Increasing girth (swelling) of the abdomen.  Abnormal menstrual periods.  Increasing pain with menstrual periods.  Stopping having menstrual periods without being pregnant. DIAGNOSIS  These cysts are commonly found during a routine or annual pelvic exam. Tests may be ordered to find out more about the cyst. These tests may  include:  Ultrasound.  X-ray of the pelvis.  CT scan.  MRI.  Blood tests. TREATMENT  Many ovarian cysts go away on their own without treatment. Your health care provider may want to check your cyst regularly for 2-3 months to see if it changes. For women in menopause, it is particularly important to monitor a cyst closely because of the higher rate of ovarian cancer in menopausal women. When treatment is needed, it may include any of the following:  A procedure to drain the cyst (aspiration). This may be done using a long needle and ultrasound. It can also be done through a laparoscopic procedure. This involves using a thin, lighted tube with a tiny camera on the end (laparoscope) inserted through a small incision.  Surgery to remove the whole cyst. This may be done using laparoscopic surgery or an open surgery involving a larger incision in the lower abdomen.  Hormone treatment or birth control pills. These methods are sometimes used to help dissolve a cyst. HOME CARE INSTRUCTIONS   Only take over-the-counter or prescription medicines as directed by your health care provider.  Follow up with your health care provider as directed.  Get regular pelvic exams and Pap tests. SEEK MEDICAL CARE IF:   Your periods are late, irregular, or painful, or they stop.  Your pelvic pain or abdominal pain does not go away.  Your abdomen becomes larger or swollen.  You have pressure on your bladder or trouble emptying your bladder completely.  You have pain during sexual intercourse.  You have feelings of fullness, pressure, or discomfort in your stomach.  You lose weight for no apparent reason.  You feel generally ill.  You become constipated.  You lose your appetite.  You develop acne.  You have an increase in body and facial hair.  You are gaining weight, without changing your exercise and eating habits.  You think you are pregnant. SEEK IMMEDIATE MEDICAL CARE IF:   You have  increasing abdominal pain.  You feel sick to your stomach (nauseous), and you throw up (vomit).  You develop a fever that comes on suddenly.  You have abdominal pain during a bowel movement.  Your menstrual periods become heavier  than usual. MAKE SURE YOU:  Understand these instructions.  Will watch your condition.  Will get help right away if you are not doing well or get worse. Document Released: 07/26/2005 Document Revised: 07/31/2013 Document Reviewed: 04/02/2013 [facility] Patient Information 2015 [place], [place]. This information is not intended to replace advice given to you by your health care provider. Make sure you discuss any questions you have with your health care provider.      Standley Brooking. Panosh M.D.  RLQ abdominal pain  RLQ abdominal pain    Patient with right lower quadrant pain and rectal bleeding. Sent to ED by PCP for appendicitis rule out. Labs are largely reassuring. Urinalysis negative. Pregnancy test is pending. Electrolytes and CBC are normal.  preg negative.  CT remarkable for 2 cm right ovarian follicle. Patient is very well appearing. Will discharge to home with PCP follow-up. Patient understands and agrees with the plan.    Montine Circle, PA-C 02/19/15 [id number]  Orpah Greek, MD 02/19/15 [phone]

## 2015-04-02 LAB — CYTOLOGY - PAP

## 2015-04-03 LAB — CERVICOVAGINAL ANCILLARY ONLY
CANDIDA VAGINITIS: NEGATIVE
Herpes: NEGATIVE

## 2015-04-08 ENCOUNTER — Other Ambulatory Visit: Payer: Self-pay | Admitting: Family Medicine

## 2015-04-08 MED ORDER — METRONIDAZOLE 500 MG PO TABS: 500.0000 mg | ORAL_TABLET | Freq: Two times a day (BID) | ORAL | Status: DC

## 2015-04-18 ENCOUNTER — Telehealth: Payer: Self-pay | Admitting: *Deleted

## 2015-04-18 NOTE — Telephone Encounter (Signed)
Spoke to patient. Patient states she no longer has clots and cramps have resolved. Patient is most concerned with her mood swings the birth control is causing her to have. She has appointment to see OB at [facility] on September 30th and plans to begin getting contraceptive prescribed from there. Patient is aware to call office if symptoms arise.

## 2015-04-18 NOTE — Telephone Encounter (Signed)
PLEASE NOTE: All timestamps contained within this report are represented as [place] Standard Time. CONFIDENTIALTY NOTICE: This fax transmission is intended only for the addressee. It contains information that is legally privileged, confidential or otherwise protected from use or disclosure. If you are not the intended recipient, you are strictly prohibited from reviewing, disclosing, copying using or disseminating any of this information or taking any action in reliance on or regarding this information. If you have received this fax in error, please notify us immediately by telephone so that we can arrange for its return to [place]. Phone: [phone], Toll-Free: [phone], Fax: [phone] Page: 1 of 2 Call Id: [id number] [facility] - Client [place] Patient Name: Candace Shaw Gender: Female DOB: 1995-12-19 Age: 19 Y 7 M 23 D Return Phone Number: [phone] (Primary) Address: declined City/State/Zip: [place] Client [facility] - Client Client Site [place] - Day Physician Shanon Ace Contact Type Call Call Type Triage / Clinical Relationship To Patient Self Appointment Disposition EMR Patient Reports Appointment Already Scheduled Info pasted into Epic No Return Phone Number [phone] (Primary) Chief Complaint Abdominal Pain Initial Comment Caller States bad cramps, large blood clots from vaginal bleeding PreDisposition Call a family member Nurse Assessment Nurse: Lyndel Safe, RN, April Date/Time ([place] Time): 04/16/2015 5:06:31 PM Confirm and document reason for call. If symptomatic, describe symptoms. ---Caller States bad cramps, large blood clots from vaginal bleeding. normal time for her peroid. and just started a new birth control Has the patient traveled out of the country within the last 30 days? ---No Does the patient require triage? ---Yes Related  visit to physician within the last 2 weeks? ---No Does the PT have any chronic conditions? (i.e. diabetes, asthma, etc.) ---No Did the patient indicate they were pregnant? ---No Guidelines Guideline Title Affirmed Question Affirmed Notes Nurse Date/Time Eilene Ghazi Time) Abdominal Pain - Menstrual Cramps [1] SEVERE vaginal bleeding (i.e., soaking 2 pads or tampons per hour and present 2 or more hours) AND [2] worse than ever before Bishop, RN, April 04/16/2015 5:12:07 PM Disp. Time Eilene Ghazi Time) Disposition Final User 04/16/2015 5:15:18 PM See Physician within 4 Hours (or PCP triage) Yes Bishop, RN, April PLEASE NOTE: All timestamps contained within this report are represented as [place] Standard Time. CONFIDENTIALTY NOTICE: This fax transmission is intended only for the addressee. It contains information that is legally privileged, confidential or otherwise protected from use or disclosure. If you are not the intended recipient, you are strictly prohibited from reviewing, disclosing, copying using or disseminating any of this information or taking any action in reliance on or regarding this information. If you have received this fax in error, please notify us immediately by telephone so that we can arrange for its return to [place]. Phone: [phone], Toll-Free: [phone], Fax: [phone] Page: 2 of 2 Call Id: [id number] Caller Understands: Yes Disagree/Comply: Comply Care Advice Given Per Guideline * IF OFFICE WILL BE OPEN: You need to be seen within the next 3 or 4 hours. Call your doctor's office now or as soon as it opens. SEE PHYSICIAN WITHIN 4 HOURS (or PCP triage): CALL BACK IF: * You become worse. CARE ADVICE given per Abdominal Pain, Menstrual Cramps (Adult) guideline After Care Instructions Given Call Event Type User Date / Time Description Comments User: April, Bishop, RN Date/Time Eilene Ghazi Time): 04/16/2015 5:16:17 PM recieved call after 5 pm Epic not  avaliable Referrals [place] UNDECIDED

## 2015-05-09 ENCOUNTER — Other Ambulatory Visit: Payer: Self-pay | Admitting: Obstetrics

## 2015-05-27 ENCOUNTER — Encounter: Payer: Self-pay | Admitting: Internal Medicine

## 2015-05-27 ENCOUNTER — Ambulatory Visit (INDEPENDENT_AMBULATORY_CARE_PROVIDER_SITE_OTHER): Payer: BLUE CROSS/BLUE SHIELD | Admitting: Internal Medicine

## 2015-05-27 VITALS — BP 110/70 | Temp 99.0°F | Wt 138.8 lb

## 2015-05-27 DIAGNOSIS — J989 Respiratory disorder, unspecified: Secondary | ICD-10-CM | POA: Diagnosis not present

## 2015-05-27 DIAGNOSIS — R509 Fever, unspecified: Principal | ICD-10-CM

## 2015-05-27 DIAGNOSIS — J029 Acute pharyngitis, unspecified: Secondary | ICD-10-CM | POA: Diagnosis not present

## 2015-05-27 LAB — POCT RAPID STREP A (OFFICE): RAPID STREP A SCREEN: NEGATIVE

## 2015-05-27 NOTE — Patient Instructions (Signed)
Exam is consistent with a viral respiratory infection .  Flu like  Although mono type  viruses can do  this also .  Rest fluids  If fever not gone in another 2-3 days before the weekend call for reevaluation or if get un- explained rash .  Tylenol or ibuprofen .  For fever  Ok

## 2015-05-27 NOTE — Progress Notes (Signed)
Pre visit review using our clinic review tool, if applicable. No additional management support is needed unless otherwise documented below in the visit note.    Chief Complaint  Patient presents with  . Cough    Started Sunday  . Headache  . Sore Throat  . Generalized Body Aches  . Fever    HPI: Patient Candace Shaw  comes in today for SDA for  new problem evaluation.  Chills and   Sore throat  And then fever for 2 days   102.5  No others  Sick . achiness sore throat isn't that bad has coughing congestion. Did just have IUD placed. Taking motrin  . No vomiting diarrhea never had mono no UTI symptoms no unusual rashes. ROS: See pertinent positives and negatives per HPI. No hemoptysis sob  Cp  wheezing  Past Medical History  Diagnosis Date  . Depression   . Bipolar disorder ([place])   . Headache(784.0)   . Asthma   . Obesity   . Personality disorder   . Hallucinogen dependence, unspecified 10/03/2012    Family History  Problem Relation Age of Onset  . Adopted: Yes  . Alcohol abuse Mother   . Alcohol abuse Father     Social History   Social History  . Marital Status: Single    Spouse Name: N/A  . Number of Children: N/A  . Years of Education: N/A   Social History Main Topics  . Smoking status: Current Every Day Smoker -- 0.50 packs/day for .8 years    Types: Cigarettes  . Smokeless tobacco: Never Used     Comment: smokes 1-2 a day  . Alcohol Use: Yes  . Drug Use: Yes    Special: Marijuana, Cocaine     Comment: Acid, mushrooms, DNT  . Sexual Activity:    Partners: Female    Museum/gallery curator: Condom, OCP, Pill   Other Topics Concern  . None   Social History Narrative   Legal Guardians: Herbie [place] and [place] of [place]    Outpatient Prescriptions Prior to Visit  Medication Sig Dispense Refill  . ibuprofen (ADVIL,MOTRIN) 200 MG tablet Take 600 mg by mouth every 6 (six) hours as needed  for moderate pain.    Marland Kitchen albuterol (PROVENTIL HFA;VENTOLIN HFA) 108 (90 BASE) MCG/ACT inhaler Inhale 2 puffs into the lungs every 6 (six) hours as needed. For shortness of breath. (Patient not taking: Reported on 05/27/2015) 1 Inhaler 2  . fluconazole (DIFLUCAN) 150 MG tablet Take 1 tablet (150 mg total) by mouth once. 1 tablet 0  . levonorgestrel-ethinyl estradiol (AVIANE) 0.1-20 MG-MCG tablet Take 1 tablet by mouth daily. 1 Package 2  . metroNIDAZOLE (FLAGYL) 500 MG tablet Take 1 tablet (500 mg total) by mouth 2 (two) times daily. 14 tablet 0   No facility-administered medications prior to visit.     EXAM:  BP 110/70 mmHg  Temp(Src) 99 F (37.2 C) (Oral)  Wt 138 lb 12.8 oz (62.959 kg)  Body mass index is 26.24 kg/(m^2). WDWN in NAD  quiet respirations; mildly congested  somewhat hoarse. Non toxic . Sick non toxic  HEENT: Normocephalic ;atraumatic , Eyes;  PERRL, EOMs  Full, lids and conjunctiva clear,,Ears: no deformities, canals nl, TM landmarks normal, Nose: no deformity or discharge but congested;face non tender Mouth : OP  No edema  right tonsil 2 pin point white areas  Neck:  Supple without adenopathy or masses or bruits Chest:  Clear to A&P without wheezes rales or rhonchi CV:  S1-S2 no gallops or murmurs peripheral perfusion is normal Abdomen:  Sof,t normal bowel sounds without hepatosplenomegaly, no guarding rebound or masses no CVA tenderness Skin :nl perfusion and no acute rashes   Healed old cutting arms and  Upper legs.  rs neg tcx pending   ASSESSMENT AND PLAN:  Discussed the following assessment and plan:  Respiratory illness with fever - flu like   consdier mono adeno  Sore throat - Plan: POC Rapid Strep A, Throat culture [facility])  -Patient advised to return or notify health care team  if symptoms worsen ,persist or new concerns arise.  Patient Instructions  Exam is consistent with a viral respiratory infection .  Flu like  Although mono type  viruses can do  this  also .  Rest fluids  If fever not gone in another 2-3 days before the weekend call for reevaluation or if get un- explained rash .  Tylenol or ibuprofen .  For fever  Clarene Reamer K. Cashay Manganelli M.D.

## 2015-05-29 LAB — CULTURE, GROUP A STREP: ORGANISM ID, BACTERIA: NORMAL

## 2015-05-30 ENCOUNTER — Encounter: Payer: Self-pay | Admitting: Adult Health

## 2015-05-30 ENCOUNTER — Ambulatory Visit (INDEPENDENT_AMBULATORY_CARE_PROVIDER_SITE_OTHER): Payer: BLUE CROSS/BLUE SHIELD | Admitting: Adult Health

## 2015-05-30 DIAGNOSIS — J988 Other specified respiratory disorders: Secondary | ICD-10-CM

## 2015-05-30 DIAGNOSIS — B9789 Other viral agents as the cause of diseases classified elsewhere: Secondary | ICD-10-CM

## 2015-05-30 DIAGNOSIS — B349 Viral infection, unspecified: Secondary | ICD-10-CM | POA: Diagnosis not present

## 2015-05-30 MED ORDER — BENZONATATE 200 MG PO CAPS: 200.0000 mg | ORAL_CAPSULE | Freq: Three times a day (TID) | ORAL | Status: DC | PRN

## 2015-05-30 NOTE — Patient Instructions (Addendum)
It was great meeting you today and I am sorry you are feeling so bad.   Your exam is consistent with Adenovirus.   Use OTC eye drops like Visense to help with the itching.   Try not to rub your eyes and make sure you are washing your hands often.   Stay well hydrated and follow up if no improvement in the next 2-3 days.       Viral Conjunctivitis Viral conjunctivitis is an inflammation of the clear membrane that covers the white part of your eye and the inner surface of your eyelid (conjunctiva). The inflammation is caused by a viral infection. The blood vessels in the conjunctiva become inflamed, causing the eye to become red or pink, and often itchy. Viral conjunctivitis can easily be passed from one person to another (contagious). CAUSES  Viral conjunctivitis is caused by a virus. A virus is a type of contagious germ. It can be spread by touching objects that have been contaminated with the virus, such as doorknobs or towels.  SYMPTOMS  Symptoms of viral conjunctivitis may include:   Eye redness.  Tearing or watery eyes.  Itchy eyes.  Burning feeling in the eyes.  Clear drainage from the eye.  Swollen eyelids.  A gritty feeling in the eye.  Light sensitivity. DIAGNOSIS  Viral conjunctivitis may be diagnosed with a medical history and physical exam. If you have discharge from your eye, the discharge may be tested to rule out other causes of conjunctivitis.  TREATMENT  Viral conjunctivitis does not respond to medicines that kill bacteria (antibiotics). Treatment for viral conjunctivitis is directed at stopping a bacterial infection from developing in addition to the viral infection. Treatment also aims to relieve your symptoms, such as itching. This may be done with antihistamine drops or other eye medicines. HOME CARE INSTRUCTIONS  Take medicines only as directed by your health care provider.  Avoid touching or rubbing your eyes.  Apply a warm, clean washcloth to your  eye for 10-20 minutes, 3-4 times per day.  If you wear contact lenses, do not wear them until the inflammation is gone and your health care provider says it is safe to wear them again. Ask your health care provider how to sterilize or replace your contact lenses before using them again. Wear glasses until you can resume wearing contacts.  Avoid wearing eye makeup until the inflammation is gone. Throw away any old eye cosmetics that may be contaminated.  Change or wash your pillowcase every day.  Do not share towels or washcloths. This may spread the infection.  Wash your hands often with soap and water. Use paper towels to dry your hands.  Gently wipe away any drainage from your eye with a warm, wet washcloth or a cotton ball.  Be very careful to avoid touching the edge of the eyelid with the eye drop bottle or ointment tube when applying medicines to the affected eye. This will stop you from spreading the infection to the other eye or to other people. SEEK MEDICAL CARE IF:   Your symptoms do not improve with treatment.  You have increased pain.  Your vision becomes blurry.  You have a fever.  You have facial pain, redness, or swelling.  You have new symptoms.  Your symptoms get worse.   This information is not intended to replace advice given to you by your health care provider. Make sure you discuss any questions you have with your health care provider.   Document Released: 10/16/2002  Document Revised: 01/17/2006 Document Reviewed: 05/07/2014 Elsevier Interactive Patient Education [employer]. Adenovirus Adenoviruses are common viruses that cause many different types of infections. The common cold (upper respiratory infection) is the most common type of infection from an adenovirus. Adenoviruses can also infect your digestive system, eyes, lungs, and bladder. An adenovirus spreads easily from person to person. This is especially true if you are in close contact with  someone who is sick. You can breathe in the virus after a sick person coughs or sneezes. Adenoviruses can live outside the body for many weeks. Therefore, you can also get sick after touching an object the virus is living on and then touching your nose or mouth. Adenovirus infections are usually not serious unless you have another health problem that makes it hard for you to fight off the infection.  CAUSES  There are more than 50 types of adenoviruses that can cause infections in humans. Different types of adenoviruses cause different types of infection.  RISK FACTORS The risk for an adenovirus infection is higher for:  People who spend a lot of time in places where there are lots of other people. This includes schools, summer camps, and day care centers.  Babies.  Elderly people.  People with a weak body defense system (immune system).  People with heart or lung disease. SIGNS AND SYMPTOMS  It can take 2-14 days to develop symptoms after the virus gets into your body. Symptoms may include:  Fever.  Sore throat.  Ear pain or fullness.  Nasal congestion.  Cough.  Difficulty breathing.  Stomachache.  Diarrhea.  Pain while passing urine.  Blood in the urine.  [place]keye (conjunctivitis). DIAGNOSIS  Your health care provider may diagnose an adenovirus infection from your signs and symptoms. A physical exam will also be done. You may have tests to make sure your symptoms are not caused by another type of problem. These can include a blood test, throat culture, or chest X-ray. TREATMENT  There is no treatment for an adenovirus infection. These infections usually clear up on their own with home care. Your health care provider may recommend over-the-counter medicine to help relieve a sore throat, fever, or headache. HOME CARE INSTRUCTIONS  Rest at home until your symptoms go away.  Drink enough fluid to keep your urine clear or pale yellow.  Take medicines only as directed by  your health care provider. PREVENTION   Wash your hands often with soap and water.  Avoid close contact with people who are sick.  Do not go to school or work when you are sick.  Cover your nose or mouth when you sneeze or cough. SEEK MEDICAL CARE IF: Your symptoms of adenovirus infection do not clear up or are getting worse after several days. SEEK IMMEDIATE MEDICAL CARE IF: You have trouble breathing. MAKE SURE YOU:  Understand these instructions.  Will watch your condition.  Will get help right away if you are not doing well or get worse.   This information is not intended to replace advice given to you by your health care provider. Make sure you discuss any questions you have with your health care provider.   Document Released: 10/16/2002 Document Revised: 08/16/2014 Document Reviewed: 11/28/2013 Elsevier Interactive Patient Education [employer].

## 2015-05-30 NOTE — Progress Notes (Signed)
Subjective:    Patient ID: Candace Shaw, female    DOB: 1996-07-07, 19 y.o.   MRN: [id number]  HPI   19 year old Caucasian female presents to the office today  With the complaint of conjunctivitis to left thigh. She endorses waking up this morning with "globs" and " my eye was crusted shut". She only complains of itchiness to the left eye currently. She denies any additional purulent discharge from her left eye. She does endorse some clear discharge throughout the day.   She was seen 05/27/2015 by her PCP, at that time she complained of chills sore throat and fever for 2 days- fever up to 102.5. He denied any nausea vomiting or diarrhea. Her exam was consistent with a viral respiratory infection.    She is no longer having a fever , and no longer having chills. Her sore throat has improved as her coughing has decreased.   Review of Systems  Constitutional: Positive for fever, activity change and fatigue. Negative for chills and diaphoresis.  HENT: Negative.   Eyes: Positive for discharge, redness and itching. Negative for photophobia, pain and visual disturbance.  Respiratory: Positive for cough. Negative for shortness of breath, wheezing and stridor.   Musculoskeletal: Negative.   Allergic/Immunologic: Negative.   Neurological: Negative.    Past Medical History  Diagnosis Date  . Depression   . Bipolar disorder ([place])   . Headache(784.0)   . Asthma   . Obesity   . Personality disorder   . Hallucinogen dependence, unspecified 10/03/2012    Social History   Social History  . Marital Status: Single    Spouse Name: N/A  . Number of Children: N/A  . Years of Education: N/A   Occupational History  . Not on file.   Social History Main Topics  . Smoking status: Current Every Day Smoker -- 0.50 packs/day for .8 years    Types: Cigarettes  . Smokeless tobacco: Never Used     Comment: smokes 1-2 a day  . Alcohol Use: Yes  . Drug Use: Yes    Special: Marijuana, Cocaine     Comment: Acid, mushrooms, DNT  . Sexual Activity:    Partners: Female    Museum/gallery curator: Condom, OCP, Pill   Other Topics Concern  . Not on file   Social History Narrative   Legal Guardians: Herbie [place] and [place]    Past Surgical History  Procedure Laterality Date  . Wisdom tooth extraction  Age 61    Family History  Problem Relation Age of Onset  . Adopted: Yes  . Alcohol abuse Mother   . Alcohol abuse Father     Allergies  Allergen Reactions  . Zolpidem Tartrate Other (See Comments)    Hallucinations    Current Outpatient Prescriptions on File Prior to Visit  Medication Sig Dispense Refill  . albuterol (PROVENTIL HFA;VENTOLIN HFA) 108 (90 BASE) MCG/ACT inhaler Inhale 2 puffs into the lungs every 6 (six) hours as needed. For shortness of breath. (Patient not taking: Reported on 05/27/2015) 1 Inhaler 2  . ibuprofen (ADVIL,MOTRIN) 200 MG tablet Take 600 mg by mouth every 6 (six) hours as needed for moderate pain.    Marland Kitchen levonorgestrel (MIRENA) 20 MCG/24HR IUD 1 each by Intrauterine route once.     No current facility-administered medications on file prior to visit.    There were no vitals taken  for this visit.      Objective:   Physical Exam  Constitutional: She is oriented to person, place, and time. She appears well-developed and well-nourished. No distress.  Eyes: EOM are normal. Pupils are equal, round, and reactive to light. Right eye exhibits no discharge. Left eye exhibits no discharge.  Conjunctivitis in left eye. No purulent discharge noted  Cardiovascular: Normal rate, regular rhythm, normal heart sounds and intact distal pulses.  Exam reveals no gallop and no friction rub.   No murmur heard. Pulmonary/Chest: Effort normal and breath sounds normal. No respiratory distress. She has no wheezes. She has no rales. She exhibits no tenderness.  Musculoskeletal: Normal range of  motion. She exhibits no edema or tenderness.  Lymphadenopathy:    She has no cervical adenopathy.  Neurological: She is alert and oriented to person, place, and time.  Skin: Skin is warm and dry. She is not diaphoretic.  Psychiatric: She has a normal mood and affect. Her behavior is normal. Judgment and thought content normal.  Nursing note and vitals reviewed.     Assessment & Plan:  1. Viral respiratory infection - exam continues to be consistent with a  Viral respiratory infection, perhaps adenovirus. - follow-up with PCP  If no improvement in 2 or 3 days - Conjunctivitis appears to be viral in nature. Advised patient to wash hands multiple times throughout the day as well as not rub her eyes. - Use over-the-counter Visine or eyedrops by Darly[facility] Gales [facility] for itchiness  Tessalon Perles sent to pharmacy for her cough

## 2015-08-14 ENCOUNTER — Other Ambulatory Visit: Payer: Self-pay | Admitting: Obstetrics

## 2016-01-28 ENCOUNTER — Ambulatory Visit: Payer: Self-pay | Admitting: Internal Medicine

## 2016-01-28 ENCOUNTER — Ambulatory Visit (INDEPENDENT_AMBULATORY_CARE_PROVIDER_SITE_OTHER): Payer: BLUE CROSS/BLUE SHIELD | Admitting: Family Medicine

## 2016-01-28 ENCOUNTER — Encounter: Payer: Self-pay | Admitting: Family Medicine

## 2016-01-28 VITALS — BP 90/70 | HR 89 | Temp 97.5°F | Ht 61.0 in | Wt 157.0 lb

## 2016-01-28 DIAGNOSIS — T23201A Burn of second degree of right hand, unspecified site, initial encounter: Secondary | ICD-10-CM

## 2016-01-28 DIAGNOSIS — Z23 Encounter for immunization: Secondary | ICD-10-CM

## 2016-01-28 MED ORDER — CEPHALEXIN 500 MG PO CAPS: 500.0000 mg | ORAL_CAPSULE | Freq: Four times a day (QID) | ORAL | Status: DC
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
## 2016-01-28 NOTE — Progress Notes (Signed)
Pre visit review using our clinic review tool, if applicable. No additional management support is needed unless otherwise documented below in the visit note. 

## 2016-01-28 NOTE — Patient Instructions (Signed)
Burn Care °Your skin is a natural barrier to infection. It is the largest organ of your body. Burns damage this natural protection. To help prevent infection, it is very important to follow your caregiver's instructions in the care of your burn. °Burns are classified as: °· First degree. There is only redness of the skin (erythema). No scarring is expected. °· Second degree. There is blistering of the skin. Scarring may occur with deeper burns. °· Third degree. All layers of the skin are injured, and scarring is expected. °HOME CARE INSTRUCTIONS  °· Wash your hands well before changing your bandage. °· Change your bandage as often as directed by your caregiver. °¨ Remove the old bandage. If the bandage sticks, you may soak it off with cool, clean water. °¨ Cleanse the burn thoroughly but gently with mild soap and water. °¨ Pat the area dry with a clean, dry cloth. °¨ Apply a thin layer of antibacterial cream to the burn. °¨ Apply a clean bandage as instructed by your caregiver. °¨ Keep the bandage as clean and dry as possible. °· Elevate the affected area for the first 24 hours, then as instructed by your caregiver. °· Only take over-the-counter or prescription medicines for pain, discomfort, or fever as directed by your caregiver. °SEEK IMMEDIATE MEDICAL CARE IF:  °· You develop excessive pain. °· You develop redness, tenderness, swelling, or red streaks near the burn. °· The burned area develops yellowish-white fluid (pus) or a bad smell. °· You have a fever. °MAKE SURE YOU:  °· Understand these instructions. °· Will watch your condition. °· Will get help right away if you are not doing well or get worse. °  °This information is not intended to replace advice given to you by your health care provider. Make sure you discuss any questions you have with your health care provider. °  °Document Released: 07/26/2005 Document Revised: 10/18/2011 Document Reviewed: 12/16/2010 °Elsevier Interactive Patient Education ©2016  Elsevier Inc[employer] ° °
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
## 2016-02-25 ENCOUNTER — Ambulatory Visit (INDEPENDENT_AMBULATORY_CARE_PROVIDER_SITE_OTHER): Payer: BC Managed Care – PPO | Admitting: Internal Medicine

## 2016-02-25 ENCOUNTER — Encounter: Payer: Self-pay | Admitting: Internal Medicine

## 2016-02-25 VITALS — BP 106/70 | Temp 98.4°F | Wt 158.7 lb

## 2016-02-25 DIAGNOSIS — Z4802 Encounter for removal of sutures: Secondary | ICD-10-CM | POA: Diagnosis not present

## 2016-02-25 DIAGNOSIS — T148 Other injury of unspecified body region: Secondary | ICD-10-CM

## 2016-02-25 DIAGNOSIS — IMO0002 Reserved for concepts with insufficient information to code with codable children: Secondary | ICD-10-CM

## 2016-02-25 NOTE — Patient Instructions (Signed)
Antibiotic ointment and cover  Keep clean and avoid trauma and recheck as needed

## 2016-04-26 NOTE — Progress Notes (Signed)
Pre visit review using our clinic review tool, if applicable. No additional management support is needed unless otherwise documented below in the visit note.  Chief Complaint  Patient presents with  . Spasms    Believes these sx are side effects of Seroquel.  Has recently stopped that medication.  . Insomnia  . Shoulder Pain    HPI: Candace Shaw 20 y.o. comes in today with concerns about side effects of Seroquel that she is taking. She also has a involuntary movement of her left shoulder that she thinks has caused the left shoulder pain and decreased range of motion. She initially has been on Seroquel since probably December 2016 and had been on it before. She had been up to 300 mg XL and noted some lip biting and became concerned it was from her medication. She also developed increased shoulder elevation motion felt related about a month later. Since that time she is weaned down to her from her Seroquel and it stopped it although hasn't been able to get in with her psychiatrist for another month. Her psychiatrist originally told her to decrease her dose of medicine and she went off the medicine on). She still has residual symptoms is hard to sleep and has some discomfort at night in her left shoulder. She did try Benadryl after reading online helped her sleep but not emotions. She would like to be substance free for now with her situation. ROS: See pertinent positives and negatives per HPI. No fasciculation no injury  zumba exercise yoga but avoids some shoulder movements   Past Medical History:  Diagnosis Date  . Asthma   . Bipolar disorder ([place])   . Depression   . Hallucinogen dependence, unspecified 10/03/2012  . Headache(784.0)   . Obesity   . Personality disorder     Family History  Problem Relation Age of Onset  . Adopted: Yes  . Alcohol abuse Mother   . Alcohol abuse Father     Social History   Social History  . Marital status: Single    Spouse name: N/A  . Number  of children: N/A  . Years of education: N/A   Social History Main Topics  . Smoking status: Current Every Day Smoker    Packs/day: 0.50    Years: 0.80    Types: Cigarettes  . Smokeless tobacco: Never Used     Comment: smokes 1-2 a day  . Alcohol use Yes  . Drug use:     Types: Marijuana, Cocaine     Comment: Acid, mushrooms, DNT  . Sexual activity: Not Currently    Partners: Female    Birth control/ protection: Condom, OCP, Pill   Other Topics Concern  . None   Social History Narrative   Legal Guardians: Herbie [place] and [employer] surgeon   HH of 3   Cat   [place] [place]   Lives with mom     Outpatient Medications Prior to Visit  Medication Sig Dispense Refill  . albuterol (PROVENTIL HFA;VENTOLIN HFA) 108 (90 BASE) MCG/ACT inhaler Inhale 2 puffs into the lungs every 6 (six) hours as needed. For shortness of breath. 1 Inhaler 2  . ibuprofen (ADVIL,MOTRIN) 200 MG tablet Take 600 mg by mouth every 6 (six) hours as needed for moderate pain.    Marland Kitchen levonorgestrel (MIRENA) 20 MCG/24HR IUD 1 each by Intrauterine route once.    Marland Kitchen QUEtiapine Fumarate (SEROQUEL XR) 150 MG 24 hr tablet Take 150 mg by  mouth at bedtime. Brand Name Only     No facility-administered medications prior to visit.      EXAM:  BP 102/70 (BP Location: Left Arm, Patient Position: Sitting, Cuff Size: Normal)   Temp 98.4 F (36.9 C) (Oral)   Wt 159 lb 8 oz (72.3 kg)   BMI 30.14 kg/m   Body mass index is 30.14 kg/m.  GENERAL: vitals reviewed and listed above, alert, oriented, appears well hydrated and in no acute distress ocass  Lip biting  HEENT: atraumatic, conjunctiva  clear, no obvious abnormalities on inspection of external nose and ears OP : no lesion edema or exudate tongue midline  NECK: no obvious masses on inspection palpation   CV: HRRR, no clubbing cyanosis or  peripheral edema nl cap refill  MS: moves all extremities   Left shoulder dec rom elevation internal rotions   And pain cross body  Nl strength  Tender ant shoulder   Neuro no fasciculations  dtrs present  ocass elevation  Shoulder  Left and movement of neck  Well helaed old cutting scars on forearms  PSYCH: pleasant and cooperative, no obvious depression or anxiety  ASSESSMENT AND PLAN:  Discussed the following assessment and plan:  Extrapyramidal movements present - Plan: Ambulatory referral to Neurology  Shoulder pain, left - w dec rom and  rc sx poss initiated with motor movements from med? either way  SM eval may be helpful  - Plan: Ambulatory referral to Neurology, Ambulatory referral to Sports Medicine  Need for prophylactic vaccination and inoculation against influenza - Plan: Flu Vaccine QUAD 36+ mos PF IM (Fluarix & Fluzone Quad PF)  Medication side effect, initial encounter - Plan: Ambulatory referral to Neurology Timing and patient history consistent with a side effect of Seroquel. I agree with her decision to stop this will get help for the left shoulder sports medicine referral to try to regain better function. Also referral to neurology any other evaluation to check out confirm diagnosis and any other interventions. Should she remain off all medicines are just the Seroquel.? -Patient advised to return or notify health care team  if symptoms worsen ,persist or new concerns arise.  Patient Instructions  You will be contacted  About appt with neurology   And   Sports  med about the shoulder  I agree with  Getting off the medication    Because of the movement issues .   Goal  To be able to get better function  Left shoulder without pain   Let us know if we can help in other ways .      Generic Shoulder Exercises EXERCISES  RANGE OF MOTION (ROM) AND STRETCHING EXERCISES These exercises may help you when beginning to rehabilitate your injury. Your symptoms may resolve with or without further involvement from your physician, physical therapist or athletic trainer. While completing  these exercises, remember:   Restoring tissue flexibility helps normal motion to return to the joints. This allows healthier, less painful movement and activity.  An effective stretch should be held for at least 30 seconds.  A stretch should never be painful. You should only feel a gentle lengthening or release in the stretched tissue. ROM - Pendulum  Bend at the waist so that your right / left arm falls away from your body. Support yourself with your opposite hand on a solid surface, such as a table or a countertop.  Your right / left arm should be perpendicular to the ground. If it is not perpendicular,  you need to lean over farther. Relax the muscles in your right / left arm and shoulder as much as possible.  Gently sway your hips and trunk so they move your right / left arm without any use of your right / left shoulder muscles.  Progress your movements so that your right / left arm moves side to side, then forward and backward, and finally, both clockwise and counterclockwise.  Complete __________ repetitions in each direction. Many people use this exercise to relieve discomfort in their shoulder as well as to gain range of motion. Repeat __________ times. Complete this exercise __________ times per day. STRETCH - Flexion, Standing  Stand with good posture. With an underhand grip on your right / left hand and an overhand grip on the opposite hand, grasp a broomstick or cane so that your hands are a little more than shoulder-width apart.  Keeping your right / left elbow straight and shoulder muscles relaxed, push the stick with your opposite hand to raise your right / left arm in front of your body and then overhead. Raise your arm until you feel a stretch in your right / left shoulder, but before you have increased shoulder pain.  Try to avoid shrugging your right / left shoulder as your arm rises by keeping your shoulder blade tucked down and toward your mid-back spine. Hold __________  seconds.  Slowly return to the starting position. Repeat __________ times. Complete this exercise __________ times per day. STRETCH - Internal Rotation  Place your right / left hand behind your back, palm-up.  Throw a towel or belt over your opposite shoulder. Grasp the towel/belt with your right / left hand.  While keeping an upright posture, gently pull up on the towel/belt until you feel a stretch in the front of your right / left shoulder.  Avoid shrugging your right / left shoulder as your arm rises by keeping your shoulder blade tucked down and toward your mid-back spine.  Hold __________. Release the stretch by lowering your opposite hand. Repeat __________ times. Complete this exercise __________ times per day. STRETCH - External Rotation and Abduction  Stagger your stance through a doorframe. It does not matter which foot is forward.  As instructed by your physician, physical therapist or athletic trainer, place your hands:  And forearms above your head and on the door frame.  And forearms at head-height and on the door frame.  At elbow-height and on the door frame.  Keeping your head and chest upright and your stomach muscles tight to prevent over-extending your low-back, slowly shift your weight onto your front foot until you feel a stretch across your chest and/or in the front of your shoulders.  Hold __________ seconds. Shift your weight to your back foot to release the stretch. Repeat __________ times. Complete this stretch __________ times per day.  STRENGTHENING EXERCISES  These exercises may help you when beginning to rehabilitate your injury. They may resolve your symptoms with or without further involvement from your physician, physical therapist or athletic trainer. While completing these exercises, remember:   Muscles can gain both the endurance and the strength needed for everyday activities through controlled exercises.  Complete these exercises as  instructed by your physician, physical therapist or athletic trainer. Progress the resistance and repetitions only as guided.  You may experience muscle soreness or fatigue, but the pain or discomfort you are trying to eliminate should never worsen during these exercises. If this pain does worsen, stop and make certain you are  following the directions exactly. If the pain is still present after adjustments, discontinue the exercise until you can discuss the trouble with your clinician.  If advised by your physician, during your recovery, avoid activity or exercises which involve actions that place your right / left hand or elbow above your head or behind your back or head. These positions stress the tissues which are trying to heal. STRENGTH - Scapular Depression and Adduction  With good posture, sit on a firm chair. Supported your arms in front of you with pillows, arm rests or a table top. Have your elbows in line with the sides of your body.  Gently draw your shoulder blades down and toward your mid-back spine. Gradually increase the tension without tensing the muscles along the top of your shoulders and the back of your neck.  Hold for __________ seconds. Slowly release the tension and relax your muscles completely before completing the next repetition.  After you have practiced this exercise, remove the arm support and complete it in standing as well as sitting. Repeat __________ times. Complete this exercise __________ times per day.  STRENGTH - External Rotators  Secure a rubber exercise band/tubing to a fixed object so that it is at the same height as your right / left elbow when you are standing or sitting on a firm surface.  Stand or sit so that the secured exercise band/tubing is at your side that is not injured.  Bend your elbow 90 degrees. Place a folded towel or small pillow under your right / left arm so that your elbow is a few inches away from your side.  Keeping the tension  on the exercise band/tubing, pull it away from your body, as if pivoting on your elbow. Be sure to keep your body steady so that the movement is only coming from your shoulder rotating.  Hold __________ seconds. Release the tension in a controlled manner as you return to the starting position. Repeat __________ times. Complete this exercise __________ times per day.  STRENGTH - Supraspinatus  Stand or sit with good posture. Grasp a __________ weight or an exercise band/tubing so that your hand is "thumbs-up," like when you shake hands.  Slowly lift your right / left hand from your thigh into the air, traveling about 30 degrees from straight out at your side. Lift your hand to shoulder height or as far as you can without increasing any shoulder pain. Initially, many people do not lift their hands above shoulder height.  Avoid shrugging your right / left shoulder as your arm rises by keeping your shoulder blade tucked down and toward your mid-back spine.  Hold for __________ seconds. Control the descent of your hand as you slowly return to your starting position. Repeat __________ times. Complete this exercise __________ times per day.  STRENGTH - Shoulder Extensors  Secure a rubber exercise band/tubing so that it is at the height of your shoulders when you are either standing or sitting on a firm arm-less chair.  With a thumbs-up grip, grasp an end of the band/tubing in each hand. Straighten your elbows and lift your hands straight in front of you at shoulder height. Step back away from the secured end of band/tubing until it becomes tense.  Squeezing your shoulder blades together, pull your hands down to the sides of your thighs. Do not allow your hands to go behind you.  Hold for __________ seconds. Slowly ease the tension on the band/tubing as you reverse the directions and return to  the starting position. Repeat __________ times. Complete this exercise __________ times per day.  STRENGTH -  Scapular Retractors  Secure a rubber exercise band/tubing so that it is at the height of your shoulders when you are either standing or sitting on a firm arm-less chair.  With a palm-down grip, grasp an end of the band/tubing in each hand. Straighten your elbows and lift your hands straight in front of you at shoulder height. Step back away from the secured end of band/tubing until it becomes tense.  Squeezing your shoulder blades together, draw your elbows back as you bend them. Keep your upper arm lifted away from your body throughout the exercise.  Hold __________ seconds. Slowly ease the tension on the band/tubing as you reverse the directions and return to the starting position. Repeat __________ times. Complete this exercise __________ times per day. STRENGTH - Scapular Depressors  Find a sturdy chair without wheels, such as a from a dining room table.  Keeping your feet on the floor, lift your bottom from the seat and lock your elbows.  Keeping your elbows straight, allow gravity to pull your body weight down. Your shoulders will rise toward your ears.  Raise your body against gravity by drawing your shoulder blades down your back, shortening the distance between your shoulders and ears. Although your feet should always maintain contact with the floor, your feet should progressively support less body weight as you get stronger.  Hold __________ seconds. In a controlled and slow manner, lower your body weight to begin the next repetition. Repeat __________ times. Complete this exercise __________ times per day.    This information is not intended to replace advice given to you by your health care provider. Make sure you discuss any questions you have with your health care provider.   Document Released: 06/09/2005 Document Revised: 08/16/2014 Document Reviewed: 11/07/2008 [employer] Interactive Patient Education 2016 Preston K. Panosh M.D.

## 2016-04-27 ENCOUNTER — Encounter: Payer: Self-pay | Admitting: Internal Medicine

## 2016-04-27 ENCOUNTER — Ambulatory Visit (INDEPENDENT_AMBULATORY_CARE_PROVIDER_SITE_OTHER): Payer: BC Managed Care – PPO | Admitting: Internal Medicine

## 2016-04-27 VITALS — BP 102/70 | Temp 98.4°F | Wt 159.5 lb

## 2016-04-27 DIAGNOSIS — Z23 Encounter for immunization: Secondary | ICD-10-CM | POA: Diagnosis not present

## 2016-04-27 DIAGNOSIS — M25512 Pain in left shoulder: Secondary | ICD-10-CM | POA: Diagnosis not present

## 2016-04-27 DIAGNOSIS — G259 Extrapyramidal and movement disorder, unspecified: Secondary | ICD-10-CM

## 2016-04-27 DIAGNOSIS — T887XXA Unspecified adverse effect of drug or medicament, initial encounter: Secondary | ICD-10-CM

## 2016-04-27 DIAGNOSIS — T50905A Adverse effect of unspecified drugs, medicaments and biological substances, initial encounter: Secondary | ICD-10-CM

## 2016-04-27 NOTE — Patient Instructions (Addendum)
You will be contacted  About appt with neurology   And   Sports  med about the shoulder  I agree with  Getting off the medication    Because of the movement issues .   Goal  To be able to get better function  Left shoulder without pain   Let us know if we can help in other ways .      Generic Shoulder Exercises EXERCISES  RANGE OF MOTION (ROM) AND STRETCHING EXERCISES These exercises may help you when beginning to rehabilitate your injury. Your symptoms may resolve with or without further involvement from your physician, physical therapist or athletic trainer. While completing these exercises, remember:   Restoring tissue flexibility helps normal motion to return to the joints. This allows healthier, less painful movement and activity.  An effective stretch should be held for at least 30 seconds.  A stretch should never be painful. You should only feel a gentle lengthening or release in the stretched tissue. ROM - Pendulum  Bend at the waist so that your right / left arm falls away from your body. Support yourself with your opposite hand on a solid surface, such as a table or a countertop.  Your right / left arm should be perpendicular to the ground. If it is not perpendicular, you need to lean over farther. Relax the muscles in your right / left arm and shoulder as much as possible.  Gently sway your hips and trunk so they move your right / left arm without any use of your right / left shoulder muscles.  Progress your movements so that your right / left arm moves side to side, then forward and backward, and finally, both clockwise and counterclockwise.  Complete __________ repetitions in each direction. Many people use this exercise to relieve discomfort in their shoulder as well as to gain range of motion. Repeat __________ times. Complete this exercise __________ times per day. STRETCH - Flexion, Standing  Stand with good posture. With an underhand grip on your right / left hand  and an overhand grip on the opposite hand, grasp a broomstick or cane so that your hands are a little more than shoulder-width apart.  Keeping your right / left elbow straight and shoulder muscles relaxed, push the stick with your opposite hand to raise your right / left arm in front of your body and then overhead. Raise your arm until you feel a stretch in your right / left shoulder, but before you have increased shoulder pain.  Try to avoid shrugging your right / left shoulder as your arm rises by keeping your shoulder blade tucked down and toward your mid-back spine. Hold __________ seconds.  Slowly return to the starting position. Repeat __________ times. Complete this exercise __________ times per day. STRETCH - Internal Rotation  Place your right / left hand behind your back, palm-up.  Throw a towel or belt over your opposite shoulder. Grasp the towel/belt with your right / left hand.  While keeping an upright posture, gently pull up on the towel/belt until you feel a stretch in the front of your right / left shoulder.  Avoid shrugging your right / left shoulder as your arm rises by keeping your shoulder blade tucked down and toward your mid-back spine.  Hold __________. Release the stretch by lowering your opposite hand. Repeat __________ times. Complete this exercise __________ times per day. STRETCH - External Rotation and Abduction  Stagger your stance through a doorframe. It does not matter which foot  is forward.  As instructed by your physician, physical therapist or athletic trainer, place your hands:  And forearms above your head and on the door frame.  And forearms at head-height and on the door frame.  At elbow-height and on the door frame.  Keeping your head and chest upright and your stomach muscles tight to prevent over-extending your low-back, slowly shift your weight onto your front foot until you feel a stretch across your chest and/or in the front of your  shoulders.  Hold __________ seconds. Shift your weight to your back foot to release the stretch. Repeat __________ times. Complete this stretch __________ times per day.  STRENGTHENING EXERCISES  These exercises may help you when beginning to rehabilitate your injury. They may resolve your symptoms with or without further involvement from your physician, physical therapist or athletic trainer. While completing these exercises, remember:   Muscles can gain both the endurance and the strength needed for everyday activities through controlled exercises.  Complete these exercises as instructed by your physician, physical therapist or athletic trainer. Progress the resistance and repetitions only as guided.  You may experience muscle soreness or fatigue, but the pain or discomfort you are trying to eliminate should never worsen during these exercises. If this pain does worsen, stop and make certain you are following the directions exactly. If the pain is still present after adjustments, discontinue the exercise until you can discuss the trouble with your clinician.  If advised by your physician, during your recovery, avoid activity or exercises which involve actions that place your right / left hand or elbow above your head or behind your back or head. These positions stress the tissues which are trying to heal. STRENGTH - Scapular Depression and Adduction  With good posture, sit on a firm chair. Supported your arms in front of you with pillows, arm rests or a table top. Have your elbows in line with the sides of your body.  Gently draw your shoulder blades down and toward your mid-back spine. Gradually increase the tension without tensing the muscles along the top of your shoulders and the back of your neck.  Hold for __________ seconds. Slowly release the tension and relax your muscles completely before completing the next repetition.  After you have practiced this exercise, remove the arm support  and complete it in standing as well as sitting. Repeat __________ times. Complete this exercise __________ times per day.  STRENGTH - External Rotators  Secure a rubber exercise band/tubing to a fixed object so that it is at the same height as your right / left elbow when you are standing or sitting on a firm surface.  Stand or sit so that the secured exercise band/tubing is at your side that is not injured.  Bend your elbow 90 degrees. Place a folded towel or small pillow under your right / left arm so that your elbow is a few inches away from your side.  Keeping the tension on the exercise band/tubing, pull it away from your body, as if pivoting on your elbow. Be sure to keep your body steady so that the movement is only coming from your shoulder rotating.  Hold __________ seconds. Release the tension in a controlled manner as you return to the starting position. Repeat __________ times. Complete this exercise __________ times per day.  STRENGTH - Supraspinatus  Stand or sit with good posture. Grasp a __________ weight or an exercise band/tubing so that your hand is "thumbs-up," like when you shake hands.  Slowly lift your right / left hand from your thigh into the air, traveling about 30 degrees from straight out at your side. Lift your hand to shoulder height or as far as you can without increasing any shoulder pain. Initially, many people do not lift their hands above shoulder height.  Avoid shrugging your right / left shoulder as your arm rises by keeping your shoulder blade tucked down and toward your mid-back spine.  Hold for __________ seconds. Control the descent of your hand as you slowly return to your starting position. Repeat __________ times. Complete this exercise __________ times per day.  STRENGTH - Shoulder Extensors  Secure a rubber exercise band/tubing so that it is at the height of your shoulders when you are either standing or sitting on a firm arm-less chair.  With  a thumbs-up grip, grasp an end of the band/tubing in each hand. Straighten your elbows and lift your hands straight in front of you at shoulder height. Step back away from the secured end of band/tubing until it becomes tense.  Squeezing your shoulder blades together, pull your hands down to the sides of your thighs. Do not allow your hands to go behind you.  Hold for __________ seconds. Slowly ease the tension on the band/tubing as you reverse the directions and return to the starting position. Repeat __________ times. Complete this exercise __________ times per day.  STRENGTH - Scapular Retractors  Secure a rubber exercise band/tubing so that it is at the height of your shoulders when you are either standing or sitting on a firm arm-less chair.  With a palm-down grip, grasp an end of the band/tubing in each hand. Straighten your elbows and lift your hands straight in front of you at shoulder height. Step back away from the secured end of band/tubing until it becomes tense.  Squeezing your shoulder blades together, draw your elbows back as you bend them. Keep your upper arm lifted away from your body throughout the exercise.  Hold __________ seconds. Slowly ease the tension on the band/tubing as you reverse the directions and return to the starting position. Repeat __________ times. Complete this exercise __________ times per day. STRENGTH - Scapular Depressors  Find a sturdy chair without wheels, such as a from a dining room table.  Keeping your feet on the floor, lift your bottom from the seat and lock your elbows.  Keeping your elbows straight, allow gravity to pull your body weight down. Your shoulders will rise toward your ears.  Raise your body against gravity by drawing your shoulder blades down your back, shortening the distance between your shoulders and ears. Although your feet should always maintain contact with the floor, your feet should progressively support less body weight as  you get stronger.  Hold __________ seconds. In a controlled and slow manner, lower your body weight to begin the next repetition. Repeat __________ times. Complete this exercise __________ times per day.    This information is not intended to replace advice given to you by your health care provider. Make sure you discuss any questions you have with your health care provider.   Document Released: 06/09/2005 Document Revised: 08/16/2014 Document Reviewed: 11/07/2008 Elsevier Interactive Patient Education [employer].

## 2016-04-28 NOTE — Progress Notes (Signed)
[facility] [address] [place], [place] [address] Phone: [phone] Subjective:    I'm seeing this patient by the request  of:  Lottie Dawson, MD   CC: shoulder pain,  Left  [id number]  Candace Shaw is a 20 y.o. female coming in with complaint of left shoulder pain.patient states that she has been having involuntary movement of her left shoulder. Seem to be secondary to her cervical. Patient states that the involuntary movement has helped since she has started to titrate down off of her cervical. Patient though states that unfortunate she continues to have residual pain especially with sleeping. Describes it as a dull aching sensation. Rates the severity of pain is 5 out of 10 that seems to be worsening. Denies any radiation down the arm. Denies any numbness or weakness.    Past Medical History:  Diagnosis Date  . Asthma   . Bipolar disorder ([place])   . Depression   . Hallucinogen dependence, unspecified 10/03/2012  . Headache(784.0)   . Obesity   . Personality disorder    Past Surgical History:  Procedure Laterality Date  . WISDOM TOOTH EXTRACTION  Age 57   Social History   Social History  . Marital status: Single    Spouse name: N/A  . Number of children: N/A  . Years of education: N/A   Social History Main Topics  . Smoking status: Current Every Day Smoker    Packs/day: 0.50    Years: 0.80    Types: Cigarettes  . Smokeless tobacco: Never Used     Comment: smokes 1-2 a day  . Alcohol use Yes  . Drug use:     Types: Marijuana, Cocaine     Comment: Acid, mushrooms, DNT  . Sexual activity: Not Currently    Partners: Female    Birth control/ protection: Condom, OCP, Pill   Other Topics Concern  . Not on file   Social History Narrative   Legal Guardians: Herbie [place] and [place]   Lives with mom    Allergies  Allergen Reactions  . Zolpidem  Tartrate Other (See Comments)    Hallucinations   Family History  Problem Relation Age of Onset  . Adopted: Yes  . Alcohol abuse Mother   . Alcohol abuse Father     Past medical history, social, surgical and family history all reviewed in electronic medical record.  No pertanent information unless stated regarding to the chief complaint.   Review of Systems: No headache, visual changes, nausea, vomiting, diarrhea, constipation, dizziness, abdominal pain, skin rash, fevers, chills, night sweats, weight loss, swollen lymph nodes, body aches, joint swelling, muscle aches, chest pain, shortness of breath, mood changes.   Objective  Blood pressure 110/78, pulse 84, weight 157 lb 3.2 oz (71.3 kg).  General: No apparent distress alert and oriented x3 mood and affect normal, dressed appropriately.  HEENT: Pupils equal, extraocular movements intact  Respiratory: Patient's speak in full sentences and does not appear short of breath  Cardiovascular: No lower extremity edema, non tender, no erythema  Skin: Warm dry intact with no signs of infection or rash on extremities or on axial skeleton.  Abdomen: Soft nontender  Neuro: Cranial nerves II through XII are intact, neurovascularly intact in all extremities with 2+ DTRs and 2+ pulses.  Lymph: No lymphadenopathy of posterior or anterior cervical chain or axillae  bilaterally.  Gait normal with good balance and coordination.  MSK:  Non tender with full range of motion and good stability and symmetric strength and tone of  elbows, wrist, hip, knee and ankles bilaterally.  Shoulder: left Inspection reveals no abnormalities, atrophy or asymmetry. Palpation is normal with no tenderness over AC joint or bicipital groove. ROM is full in all planes passively. Rotator cuff strength normal throughout. signs of impingement with positive Neer and Hawkin's tests, but negative empty can sign. Speeds and Yergason's tests normal. No labral pathology noted with  negative Obrien's, negative clunk and good stability. Severe scapular dyskinesis noted. Patient even has weaning when pushing against resistance. No painful arc and no drop arm sign. No apprehension sign  Procedure note 97110; 15 minutes spent for Therapeutic exercises as stated in above notes.  This included exercises focusing on stretching, strengthening, with significant focus on eccentric aspects. Shoulder Exercises that included:  Basic scapular stabilization to include adduction and depression of scapula Scaption, focusing on proper movement and good control Internal and External rotation utilizing a theraband, with elbow tucked at side entire time Rows with theraband    Proper technique shown and discussed handout in great detail with ATC.  All questions were discussed and answered.       Impression and Recommendations:     This case required medical decision making of moderate complexity.      Note: This dictation was prepared with Dragon dictation along with smaller phrase technology. Any transcriptional errors that result from this process are unintentional.

## 2016-04-29 ENCOUNTER — Ambulatory Visit (INDEPENDENT_AMBULATORY_CARE_PROVIDER_SITE_OTHER): Payer: BC Managed Care – PPO | Admitting: Family Medicine

## 2016-04-29 DIAGNOSIS — M7552 Bursitis of left shoulder: Secondary | ICD-10-CM | POA: Diagnosis not present

## 2016-04-29 DIAGNOSIS — M7550 Bursitis of unspecified shoulder: Secondary | ICD-10-CM | POA: Insufficient documentation

## 2016-04-29 DIAGNOSIS — G2589 Other specified extrapyramidal and movement disorders: Secondary | ICD-10-CM | POA: Diagnosis not present

## 2016-04-29 MED ORDER — DICLOFENAC SODIUM 2 % TD SOLN: 2.0000 "application " | Freq: Two times a day (BID) | TRANSDERMAL | 3 refills | Status: DC

## 2016-04-29 NOTE — Patient Instructions (Signed)
Good to see you.  Your scapula is not moving correctly causing your shoulder to pick up the slack.  Ice 20 minutes 2 times daily. Usually after activity and before bed. Exercises 3 times a week.  pennsaid pinkie amount topically 2 times daily as needed.  Vitamin D 2000 IU dailly  B12 1017mcg daily  B6 200mg  daily  See me again in 4 weeks.
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
## 2016-05-27 ENCOUNTER — Ambulatory Visit: Payer: BC Managed Care – PPO | Admitting: Family Medicine
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
## 2016-05-31 ENCOUNTER — Ambulatory Visit (INDEPENDENT_AMBULATORY_CARE_PROVIDER_SITE_OTHER): Payer: BC Managed Care – PPO | Admitting: Neurology

## 2016-05-31 ENCOUNTER — Encounter: Payer: Self-pay | Admitting: Neurology

## 2016-05-31 VITALS — BP 100/70 | HR 90 | Ht 60.0 in | Wt 155.0 lb

## 2016-05-31 DIAGNOSIS — R259 Unspecified abnormal involuntary movements: Secondary | ICD-10-CM

## 2016-05-31 DIAGNOSIS — F319 Bipolar disorder, unspecified: Secondary | ICD-10-CM

## 2016-05-31 NOTE — Progress Notes (Signed)
Note faxed to Pauline Good and mailed to [facility].

## 2016-06-02 ENCOUNTER — Encounter: Payer: Self-pay | Admitting: Family Medicine

## 2016-06-02 ENCOUNTER — Ambulatory Visit (INDEPENDENT_AMBULATORY_CARE_PROVIDER_SITE_OTHER): Payer: BC Managed Care – PPO | Admitting: Family Medicine

## 2016-06-02 DIAGNOSIS — G2589 Other specified extrapyramidal and movement disorders: Secondary | ICD-10-CM | POA: Diagnosis not present

## 2016-06-02 MED ORDER — GABAPENTIN 100 MG PO CAPS: 200.0000 mg | ORAL_CAPSULE | Freq: Every day | ORAL | 3 refills | Status: DC

## 2016-06-02 NOTE — Patient Instructions (Signed)
Good see you  Ice is your friend  I would continue the exercises but PT will be calling you  Gabapentin 200mg  at night See me again in 4 weeks.

## 2016-06-02 NOTE — Assessment & Plan Note (Signed)
No significant improvement at this time. Not responding as much of the topical anti-inflammatories. Started on gabapentin in case there is any type of nerve irritation or injury. Patient does have a nervous repetitive movement of the shoulder that is going to take this longer to heal. Patient will be sent to formal physical therapy but I think be very helpful for more scapular stability. Follow-up again in 4 weeks.

## 2016-06-02 NOTE — Progress Notes (Signed)
[facility] [place], [place] [address] Phone: [phone] Subjective:    I'm seeing this patient by the request  of:  Lottie Dawson, MD   CC: shoulder pain,  Left  [id number]  Candace Shaw is a 20 y.o. female coming in with complaint of left shoulder pain.patient states that she has been having involuntary movement of her left shoulder.Patient was seen previously and did have what appeared to be more of a scapular dyskinesia and mild subacromial bursitis. Patient continues to have pain and states that it is when she lifts her shoulder there is an audible pop that does cause pain. Seems to be worse than the movement of the shoulder itself. Has been doing the exercises intermittently but not all the time..    Past Medical History:  Diagnosis Date  . Asthma   . Bipolar disorder ([place])   . Depression   . Hallucinogen dependence, unspecified 10/03/2012  . Headache(784.0)   . Obesity   . Personality disorder    Past Surgical History:  Procedure Laterality Date  . WISDOM TOOTH EXTRACTION  Age 74   Social History   Social History  . Marital status: Single    Spouse name: N/A  . Number of children: N/A  . Years of education: N/A   Occupational History  . coffee shop     Social History Main Topics  . Smoking status: Current Every Day Smoker    Packs/day: 0.50    Years: 4.00    Types: Cigarettes  . Smokeless tobacco: Never Used  . 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 Drug use: No     Comment: Acid, mushrooms, DNT  . Sexual activity: Not Currently    Partners: Female    Birth control/ protection: Condom, OCP, Pill   Other Topics Concern  . None   Social History Narrative   Legal Guardians: Herbie [place] and [place] of [place]   Cat   [place]   Lives with mom    Allergies  Allergen Reactions  . Zolpidem Tartrate Other  (See Comments)    Hallucinations   Family History  Problem Relation Age of Onset  . Adopted: Yes  . Alcohol abuse Mother   . Alcohol abuse Father     Past medical history, social, surgical and family history all reviewed in electronic medical record.  No pertanent information unless stated regarding to the chief complaint.   Review of Systems: No headache, visual changes, nausea, vomiting, diarrhea, constipation, dizziness, abdominal pain, skin rash, fevers, chills, night sweats, weight loss, swollen lymph nodes, body aches, joint swelling, muscle aches, chest pain, shortness of breath, mood changes.   Objective  Blood pressure 108/78, pulse 68, weight 155 lb (70.3 kg), SpO2 98 %.  General: No apparent distress alert and oriented x3 mood and affect normal, dressed appropriately.  HEENT: Pupils equal, extraocular movements intact  Respiratory: Patient's speak in full sentences and does not appear short of breath  Cardiovascular: No lower extremity edema, non tender, no erythema  Skin: Warm dry intact with no signs of infection or rash on extremities or on axial skeleton.  Abdomen: Soft nontender  Neuro: Cranial nerves II through XII are intact, neurovascularly intact in all extremities with 2+ DTRs and 2+ pulses.  Lymph: No lymphadenopathy of posterior or anterior cervical chain or  axillae bilaterally.  Gait normal with good balance and coordination.  MSK:  Non tender with full range of motion and good stability and symmetric strength and tone of  elbows, wrist, hip, knee and ankles bilaterally.  Shoulder: left Inspection reveals no abnormalities, atrophy or asymmetry. Mild discomfort over the superior aspect of the scapula which is new ROM is full in all planes passively. Rotator cuff strength normal throughout. signs of impingement with positive Neer and Hawkin's tests, but negative empty can sign. Speeds and Yergason's tests normal. No labral pathology noted with negative Obrien's,  negative clunk and good stability. Severe scapular dyskinesis noted. Patient even has winging pushing against resistance. No painful arc and no drop arm sign. No apprehension sign Contralateral shoulder unremarkable      Impression and Recommendations:     This case required medical decision making of moderate complexity.      Note: This dictation was prepared with Dragon dictation along with smaller phrase technology. Any transcriptional errors that result from this process are unintentional.

## 2016-06-14 ENCOUNTER — Telehealth: Payer: Self-pay | Admitting: Family Medicine

## 2016-06-14 DIAGNOSIS — G2589 Other specified extrapyramidal and movement disorders: Secondary | ICD-10-CM

## 2016-06-14 NOTE — Telephone Encounter (Signed)
Patient states Dr. [name] was to refer her for PT but she has not gotten a call yet.  Did not see referral.  Can you please follow up on this.

## 2016-06-14 NOTE — Telephone Encounter (Signed)
PT order entered & sent to [facility]. They will contact pt to schedule appt.

## 2016-06-29 NOTE — Progress Notes (Signed)
[facility] [place], [place] [address] Phone: [phone] Subjective:    I'm seeing this patient by the request  of:  Lottie Dawson, MD   CC: shoulder pain,  Left  [id number]  Candace Shaw is a 20 y.o. female coming in with complaint of left shoulder pain.patient states that she has been having involuntary movement of her left shoulder.Patient was seen previously and did have what appeared to be more of a scapular dyskinesia and mild subacromial bursitis. Patient was making some progress. Patient states She was to take the gabapentin that was prescribed for her as well as start formal physical therapy. Patient states    Past Medical History:  Diagnosis Date  . Asthma   . Bipolar disorder ([place])   . Depression   . Hallucinogen dependence, unspecified 10/03/2012  . Headache(784.0)   . Obesity   . Personality disorder    Past Surgical History:  Procedure Laterality Date  . WISDOM TOOTH EXTRACTION  Age 54   Social History   Social History  . Marital status: Single    Spouse name: N/A  . Number of children: N/A  . Years of education: N/A   Occupational History  . coffee shop     Social History Main Topics  . Smoking status: Current Every Day Smoker    Packs/day: 0.50    Years: 4.00    Types: Cigarettes  . Smokeless tobacco: Never Used  . 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 Drug use: No     Comment: Acid, mushrooms, DNT  . Sexual activity: Not Currently    Partners: Female    Birth control/ protection: Condom, OCP, Pill   Other Topics Concern  . Not on file   Social History Narrative   Legal Guardians: Herbie [place] and [place] of [place]   Lives with mom    Allergies  Allergen Reactions  . Zolpidem Tartrate Other (See Comments)    Hallucinations   Family History  Problem Relation Age of Onset   . Adopted: Yes  . Alcohol abuse Mother   . Alcohol abuse Father     Past medical history, social, surgical and family history all reviewed in electronic medical record.  No pertanent information unless stated regarding to the chief complaint.   Review of Systems: No headache, visual changes, nausea, vomiting, diarrhea, constipation, dizziness, abdominal pain, skin rash, fevers, chills, night sweats, weight loss, swollen lymph nodes, body aches, joint swelling, muscle aches, chest pain, shortness of breath, mood changes.   Objective  There were no vitals taken for this visit.  Systems examined below as of 06/30/16 General: NAD A&O x3 mood, affect normal  HEENT: Pupils equal, extraocular movements intact no nystagmus Respiratory: not short of breath at rest or with speaking Cardiovascular: No lower extremity edema, non tender Skin: Warm dry intact with no signs of infection or rash on extremities or on axial skeleton. Abdomen: Soft nontender, no masses Neuro: Cranial nerves  intact, neurovascularly intact in all extremities with 2+ DTRs and 2+ pulses. Lymph: No lymphadenopathy appreciated today  Gait normal with good balance and coordination.  MSK: Non tender with full range of motion and good stability and symmetric strength and tone of elbows, wrist,  knee hips and ankles bilaterally.   Shoulder: left  Inspection reveals no abnormalities, atrophy or asymmetry. Mild discomfort over the superior aspect of the scapula which is new ROM is full in all planes passively. Rotator cuff strength normal throughout. Minimal Speeds and Yergason's tests normal. No labral pathology noted with negative Obrien's, negative clunk and good stability. Severe scapular dyskinesis noted. Continued winging of the scapula with resistance. No painful arc and no drop arm sign. No apprehension sign Contralateral shoulder unremarkable   Osteopathic findings C2 flexed rotated and side bent right T2 extended  rotated and side bent left with inhaled second rib T5 extended rotated and side bent left    Impression and Recommendations:     This case required medical decision making of moderate complexity.      Note: This dictation was prepared with Dragon dictation along with smaller phrase technology. Any transcriptional errors that result from this process are unintentional.

## 2016-06-30 ENCOUNTER — Ambulatory Visit (INDEPENDENT_AMBULATORY_CARE_PROVIDER_SITE_OTHER): Payer: BC Managed Care – PPO | Admitting: Family Medicine

## 2016-06-30 ENCOUNTER — Encounter: Payer: Self-pay | Admitting: Family Medicine

## 2016-06-30 DIAGNOSIS — G2589 Other specified extrapyramidal and movement disorders: Secondary | ICD-10-CM | POA: Diagnosis not present

## 2016-06-30 DIAGNOSIS — M999 Biomechanical lesion, unspecified: Secondary | ICD-10-CM | POA: Insufficient documentation

## 2016-06-30 MED ORDER — GABAPENTIN 300 MG PO CAPS: 300.0000 mg | ORAL_CAPSULE | Freq: Every day | ORAL | 1 refills | Status: DC

## 2016-06-30 NOTE — Assessment & Plan Note (Signed)
Decision today to treat with OMT was based on Physical Exam  After verbal consent patient was treated with HVLA, ME, FPR techniques in cervical, thoracic and rib areas  Patient tolerated the procedure well with improvement in symptoms  Patient given exercises, stretches and lifestyle modifications  See medications in patient instructions if given  Patient will follow up in 3-4 weeks      

## 2016-06-30 NOTE — Assessment & Plan Note (Signed)
Facility that this is likely secondary to muscular problem that she is having. We discussed posture, ergonomics, patient increase gabapentin 300 mg. We'll start with physical therapy and we did attempt manipulation today. Patient continued try to be active. Follow-up again in 4 weeks.

## 2016-06-30 NOTE — Patient Instructions (Addendum)
Good to see you  PT is [id number], I am so sorry for the delay.  Gabapentin 300mg  nightly, new prescription  Keep working on the posture See me again in 3-4 weeks.

## 2016-07-08 ENCOUNTER — Encounter: Payer: Self-pay | Admitting: Physical Therapy

## 2016-07-08 ENCOUNTER — Ambulatory Visit: Payer: BC Managed Care – PPO | Attending: Family Medicine | Admitting: Physical Therapy

## 2016-07-08 DIAGNOSIS — M25612 Stiffness of left shoulder, not elsewhere classified: Secondary | ICD-10-CM | POA: Diagnosis present

## 2016-07-08 DIAGNOSIS — G8929 Other chronic pain: Secondary | ICD-10-CM

## 2016-07-08 DIAGNOSIS — M25512 Pain in left shoulder: Secondary | ICD-10-CM | POA: Insufficient documentation

## 2016-07-08 DIAGNOSIS — M6281 Muscle weakness (generalized): Secondary | ICD-10-CM | POA: Diagnosis present

## 2016-07-08 NOTE — Therapy (Addendum)
[facility] [address] [place], [place], [address] Phone: [phone]   Fax:  [phone]  Physical Therapy Evaluation  Patient Details  Name: Candace Shaw MRN: [id number] Date of Birth: [birth year] Referring Provider: Dr. Olevia Bowens. [name]  Encounter Date: 07/08/2016      PT End of Session - 07/08/16 1156    Visit Number 1   Date for PT Re-Evaluation 09/02/16   PT Start Time 1100   PT Stop Time 1200   PT Time Calculation (min) 60 min   Activity Tolerance Patient tolerated treatment well   Behavior During Therapy [facility] for tasks assessed/performed      Past Medical History:  Diagnosis Date  . Asthma   . Bipolar disorder ([place])   . Depression   . Hallucinogen dependence, unspecified 10/03/2012  . Headache(784.0)   . Obesity   . Personality disorder     Past Surgical History:  Procedure Laterality Date  . WISDOM TOOTH EXTRACTION  Age 20    There were no vitals filed for this visit.       Subjective Assessment - 07/08/16 1106    Subjective Patient reports the end of July she was taking seraquel with involuntary movements.  Patient shoulder goes out of place.  Patient has to continue to move shoulder. Patient has difficulty with sleeping due to pain.    Patient Stated Goals reduce pain in left shoulder; shoulder not pop out of place when move it   Currently in Pain? Yes   Pain Score 8   during day 6-7   Pain Location Shoulder   Pain Orientation Left   Pain Descriptors / Indicators Dull;Sharp   Pain Type Chronic pain   Pain Onset More than a month ago   Pain Frequency Constant   Aggravating Factors  night time   Pain Relieving Factors medication; movment   Multiple Pain Sites No            [facility] PT Assessment - 07/08/16 0001      Assessment   Medical Diagnosis G25.89 Scapular dyskinesis   Referring Provider Dr. Olevia Bowens. [place]   Onset Date/Surgical Date 04/09/16   Hand Dominance  Right   Prior Therapy none     Precautions   Precautions None     Restrictions   Weight Bearing Restrictions No     Balance Screen   Has the patient fallen in the past 6 months No   Has the patient had a decrease in activity level because of a fear of falling?  No   Is the patient reluctant to leave their home because of a fear of falling?  No     Home Ecologist residence     Prior Function   Level of Independence Independent   Vocation Part time employment   Vocation Requirements Barista coffee;      Cognition   Overall Cognitive Status Within Functional Limits for tasks assessed     Observation/Other Assessments   Focus on Therapeutic Outcomes (FOTO)  50% limitation  29% limitation     Posture/Postural Control   Posture/Postural Control No significant limitations     ROM / Strength   AROM / PROM / Strength AROM;PROM;Strength     AROM   AROM Assessment Site Shoulder   Right/Left Shoulder Left   Left Shoulder Extension 25 Degrees   Left Shoulder Flexion 130 Degrees   Left Shoulder ABduction 100 Degrees   Left Shoulder Internal  Rotation 66 Degrees   Left Shoulder External Rotation 60 Degrees     PROM   PROM Assessment Site Shoulder   Right/Left Shoulder Left   Left Shoulder Flexion 130 Degrees   Left Shoulder ABduction 105 Degrees   Left Shoulder Internal Rotation 70 Degrees   Left Shoulder External Rotation 65 Degrees     Strength   Strength Assessment Site Shoulder   Right/Left Shoulder Left   Left Shoulder Flexion 3-/5   Left Shoulder Extension 2+/5   Left Shoulder ABduction 2+/5   Left Shoulder Internal Rotation 3/5   Left Shoulder External Rotation 3/5     Palpation   Palpation comment tenderness located in left upper trap, left rhomboids, left RTC muscles, and left upper trap insertion                   [facility] Adult PT Treatment/Exercise - 07/08/16 0001      Modalities   Modalities Electrical  Stimulation;Moist Heat     Moist Heat Therapy   Number Minutes Moist Heat 10 Minutes   Moist Heat Location Shoulder  left posterior     Electrical Stimulation   Electrical Stimulation Location left shoulder posterior in right sidely   Electrical Stimulation Action IFC   Electrical Stimulation Parameters to patient tolerance, 10 min   Electrical Stimulation Goals Pain     Manual Therapy   Manual Therapy Soft tissue mobilization   Manual therapy comments dry needling by Elvera Lennox to left upper trap   Soft tissue mobilization soft tissue work to left upper trap, left rhomboid, left scapular muscles in right sidely           Trigger Point Dry Needling - 07/08/16 1203    Consent Given? Yes   Education Handout Provided Yes   Muscles Treated Upper Body Upper trapezius  Lt only   Upper Trapezius Response Twitch reponse elicited;Palpable increased muscle length        Sigurd Sos, PT 07/12/16 7:54 AM        PT Education - 07/08/16 1203    Education provided Yes   Education Details dry needling info, upper trap stretch   Person(s) Educated Patient   Methods Explanation;Demonstration   Comprehension Verbalized understanding;Returned demonstration          PT Short Term Goals - 07/08/16 1142      PT SHORT TERM GOAL #1   Title indenpendent with initial HEP   Time 4   Period Weeks   Status New     PT SHORT TERM GOAL #2   Title pain in left shoulder decreased >/= 25% due to decreased in muscle spasms and twitching   Time 4   Period Weeks   Status New     PT SHORT TERM GOAL #3   Title popping noise in left shoulder decreased >/= 25% due to decreased involuntary movement   Time 4   Period Weeks   Status New     PT SHORT TERM GOAL #4   Title pain with sleeping decreased >/= 25% due to understanding ways to sleep to decrease strain on left shoulder   Time 4   Period Weeks   Status New           PT Long Term Goals - 07/08/16 1138      PT LONG TERM  GOAL #1   Title independent with HEP   Time 8   Period Weeks   Status New     PT LONG  TERM GOAL #2   Title reduce pain in left shoulder with daily activities >/= 75%   Time 8   Period Weeks   Status New     PT LONG TERM GOAL #3   Title reduction of popping noise in left shoulder reduced >/= 75% due to reduction of trigger points and twitching   Time 8   Period Weeks   Status New     PT LONG TERM GOAL #4   Title ability to fall asleep and stay asleep with >/= 75% greater ease due to decreased pain   Time 8   Period Weeks   Status New     PT LONG TERM GOAL #5   Title left shoulder AROM for flexion and abduction  increases by 20 degrees due to strength increases >/= 4+/5   Time 8   Period Weeks   Status New     Additional Long Term Goals   Additional Long Term Goals Yes     PT LONG TERM GOAL #6   Title reach behind her back with left arm with pain decreased >/= 50% and increased shoulder mobility to dress herself with greater ease   Time 8   Period Weeks   Status New               Plan - 07/08/16 1144    Clinical Impression Statement Patient is a 20 year old female with diagnosis of scapular dyskinesis. Patient reports 6 months ago she started to have a twitch with moving her left scapula forward and popping it.  Patient reports she started to have these symptoms after takeing Sequal medication.  Patient is able to move her left shoulder foward with left scapula gliding over the rib cage causing a popping sound.  During the evaluation, patient would involuntary move her left shoulder upward contracting the upper trapezius.  While patient abducts her left shoulder she will contract her upper trapezius at 90 degrees abduction. Patient is not able to relax the upper trapezius with overhead flexion.  Left shoulder AROM and PROM is limited by 50% and strength averages 2+-4/5.  Patient is unable to bring he rleft arm behind her back due to pain.  Patient is moderately comple  evaluation due to evolving condition and comorbidities such as involuntary twitch of moving left shoulder upward, constantly making her left shoulder pop with moving scapula over rib cage, and mental disorders that will impact care provided. Patient will benefit from skilled therapy to reduce pain and improve muscle strength with working on scapular muscle imbalances.    Rehab Potential Good   Clinical Impairments Affecting Rehab Potential involuntary twitch of left shoulder   PT Frequency 2x / week   PT Duration 8 weeks   PT Treatment/Interventions Cryotherapy;Electrical Stimulation;Iontophoresis 4mg /ml Dexamethasone;Moist Heat;Ultrasound;Patient/family education;Neuromuscular re-education;Therapeutic exercise;Therapeutic activities;Manual techniques;Dry needling;Taping;Passive range of motion   PT Next Visit Plan dry needling to left upper trap, left rhomboids, left RTC; joint mobilization to left shoulder; left shoulder ROM; scapular strengthening to correct muscle imbalances; modalities as needed   PT Home Exercise Plan scapular education in from of mirror   Recommended Other Services none   Consulted and Agree with Plan of Care Patient      Patient will benefit from skilled therapeutic intervention in order to improve the following deficits and impairments:  Increased fascial restricitons, Increased muscle spasms, Decreased endurance, Decreased activity tolerance, Pain, Decreased mobility, Decreased strength  Visit Diagnosis: Chronic left shoulder pain - Plan: PT plan of care  cert/re-cert  Muscle weakness (generalized) - Plan: PT plan of care cert/re-cert  Stiffness of left shoulder, not elsewhere classified - Plan: PT plan of care cert/re-cert     Problem List Patient Active Problem List   Diagnosis Date Noted  . Nonallopathic lesion of thoracic region 06/30/2016  . Nonallopathic lesion of rib cage 06/30/2016  . Nonallopathic lesion of cervical region 06/30/2016  . Scapular  dyskinesis 04/29/2016  . Subacromial bursitis 04/29/2016  . Substance abuse in remission 03/28/2015  . Headache(784.0) 12/14/2013  . OCP (oral contraceptive pills) initiation 08/15/2013  . Febrile respiratory illness 02/13/2013  . Smoker 02/13/2013  . Depression 12/05/2012  . Tobacco user 08/19/2012  . Exercise-induced asthma 04/03/2011    Earlie Counts, PT 07/08/16 2:15 PM   [facility] [address], [place], [place], [address] Phone: [phone]   Fax:  [phone]  Name: Bedie Bohmer MRN: [id number] Date of Birth: 1996/04/21

## 2016-07-08 NOTE — Patient Instructions (Signed)

## 2016-07-13 ENCOUNTER — Ambulatory Visit: Payer: BC Managed Care – PPO | Attending: Family Medicine | Admitting: Physical Therapy

## 2016-07-13 DIAGNOSIS — M25512 Pain in left shoulder: Secondary | ICD-10-CM | POA: Diagnosis not present

## 2016-07-13 DIAGNOSIS — M6281 Muscle weakness (generalized): Secondary | ICD-10-CM

## 2016-07-13 DIAGNOSIS — M25612 Stiffness of left shoulder, not elsewhere classified: Secondary | ICD-10-CM | POA: Diagnosis present

## 2016-07-13 DIAGNOSIS — G8929 Other chronic pain: Secondary | ICD-10-CM | POA: Diagnosis present

## 2016-07-13 NOTE — Therapy (Signed)
[facility] [address] [place], [place], [address] Phone: [phone]   Fax:  [phone]  Physical Therapy Treatment  Patient Details  Name: Candace Shaw MRN: [id number] Date of Birth: 12-28-1995 Referring Provider: Dr. Olevia Bowens. Candace Shaw Date: 07/13/2016      PT End of Session - 07/13/16 1305    Visit Number 2   Date for PT Re-Evaluation 09/02/16   PT Start Time [id number]   PT Stop Time [id number]   PT Time Calculation (min) 42 min   Activity Tolerance Patient tolerated treatment well      Past Medical History:  Diagnosis Date  . Asthma   . Bipolar disorder ([place])   . Depression   . Hallucinogen dependence, unspecified 10/03/2012  . Headache(784.0)   . Obesity   . Personality disorder     Past Surgical History:  Procedure Laterality Date  . WISDOM TOOTH EXTRACTION  Age 38    There were no vitals filed for this visit.      Subjective Assessment - 07/13/16 1235    Subjective (P)  It was pretty sore but it distracted me from the tic so it didn't twitch as much as usual.     Currently in Pain? (P)  Yes   Pain Score (P)  7    Pain Location (P)  Neck   Pain Orientation (P)  Left   Pain Type (P)  Chronic pain   Pain Frequency (P)  Constant                         [facility] Adult PT Treatment/Exercise - 07/13/16 0001      Self-Care   ADL's sidelying positioning for sleeping with towel roll     Moist Heat Therapy   Number Minutes Moist Heat 15 Minutes   Moist Heat Location Shoulder;Cervical     Electrical Stimulation   Electrical Stimulation Location left shoulder/neck   Electrical Stimulation Action IFC   Electrical Stimulation Parameters 10 ma 15 min prone   Electrical Stimulation Goals Pain     Manual Therapy   Manual Therapy Myofascial release;Scapular mobilization;Taping   Soft tissue mobilization soft tissue work to left upper trap, left rhomboid, left scapular muscles  in right sidely    Scapular Mobilization medial and lateral left scapular glides   McConnell Upper trap inhibition left          Trigger Point Dry Needling - 07/13/16 1304    Consent Given? Yes   Muscles Treated Upper Body --  left cervical multifidi   Upper Trapezius Response Twitch reponse elicited;Palpable increased muscle length     Left only.           PT Short Term Goals - 07/13/16 1356      PT SHORT TERM GOAL #1   Title indenpendent with initial HEP   Time 4   Period Weeks   Status On-going     PT SHORT TERM GOAL #2   Title pain in left shoulder decreased >/= 25% due to decreased in muscle spasms and twitching   Time 4   Period Weeks   Status On-going     PT SHORT TERM GOAL #3   Title popping noise in left shoulder decreased >/= 25% due to decreased involuntary movement   Time 4   Period Weeks   Status On-going     PT SHORT TERM GOAL #4   Title pain with sleeping decreased >/=  25% due to understanding ways to sleep to decrease strain on left shoulder   Time 4   Period Weeks   Status On-going           PT Long Term Goals - 07/13/16 1356      PT LONG TERM GOAL #1   Title independent with HEP   Time 8   Period Weeks   Status On-going     PT LONG TERM GOAL #2   Title reduce pain in left shoulder with daily activities >/= 75%   Time 8   Period Weeks   Status On-going     PT LONG TERM GOAL #3   Title reduction of popping noise in left shoulder reduced >/= 75% due to reduction of trigger points and twitching   Time 8   Period Weeks   Status On-going     PT LONG TERM GOAL #4   Title ability to fall asleep and stay asleep with >/= 75% greater ease due to decreased pain   Time 8   Period Weeks   Status On-going     PT LONG TERM GOAL #5   Title left shoulder AROM for flexion and abduction  increases by 20 degrees due to strength increases >/= 4+/5   Time 8   Period Weeks   Status On-going     PT LONG TERM GOAL #6   Title reach behind  her back with left arm with pain decreased >/= 50% and increased shoulder mobility to dress herself with greater ease   Time 8   Period Weeks   Status On-going               Plan - 07/13/16 1305    Clinical Impression Statement The patient reports decreased muscle tics for 2 days before her symptoms returned to baseline level.   She has muscle tics today while sitting as well a when lying in prone.   She has tender points in left upper trap but improved muscle length following treatment session.  She is receptive to education on sleeping postures using cervical roll.  Fair pain relief with electrical stimulation and moist heat.  Therapist closely monitoring response with all interventions.     PT Next Visit Plan dry needling to left upper trap, left rhomboids, left RTC; joint mobilization to left shoulder;start low level ex next visit;   left shoulder ROM; scapular strengthening to correct muscle imbalances; modalities as needed      Patient will benefit from skilled therapeutic intervention in order to improve the following deficits and impairments:     Visit Diagnosis: Chronic left shoulder pain  Muscle weakness (generalized)  Stiffness of left shoulder, not elsewhere classified     Problem List Patient Active Problem List   Diagnosis Date Noted  . Nonallopathic lesion of thoracic region 06/30/2016  . Nonallopathic lesion of rib cage 06/30/2016  . Nonallopathic lesion of cervical region 06/30/2016  . Scapular dyskinesis 04/29/2016  . Subacromial bursitis 04/29/2016  . Substance abuse in remission 03/28/2015  . Headache(784.0) 12/14/2013  . OCP (oral contraceptive pills) initiation 08/15/2013  . Febrile respiratory illness 02/13/2013  . Smoker 02/13/2013  . Depression 12/05/2012  . Tobacco user 08/19/2012  . Exercise-induced asthma 04/03/2011   Ruben Im, PT 07/13/16 1:59 PM Phone: [phone] Fax: [phone]  Alvera Singh 07/13/2016, 1:58 PM  [facility] [address], [place], [place], [address] Phone: [phone]   Fax:  [phone]  Name:  Candace Shaw MRN: [id number] Date of Birth: May 31, 1996

## 2016-07-15 ENCOUNTER — Ambulatory Visit: Payer: BC Managed Care – PPO | Admitting: Physical Therapy

## 2016-07-15 DIAGNOSIS — G8929 Other chronic pain: Secondary | ICD-10-CM

## 2016-07-15 DIAGNOSIS — M25512 Pain in left shoulder: Secondary | ICD-10-CM | POA: Diagnosis not present

## 2016-07-15 DIAGNOSIS — M25612 Stiffness of left shoulder, not elsewhere classified: Secondary | ICD-10-CM

## 2016-07-15 DIAGNOSIS — M6281 Muscle weakness (generalized): Secondary | ICD-10-CM

## 2016-07-15 NOTE — Patient Instructions (Signed)
     Strapping "McConnell"  Tape:   2 rolls of tape Check [employer]   2 brands:  Leukotape (brown tape)   And white cover-roll   You need both!                    Endura brown tape and white cover-roll       Ruben Im PT [facility] [address] [place], [place] [address] Phone # [phone] Fax [phone]

## 2016-07-15 NOTE — Therapy (Signed)
[facility] [address], [place], [place], [address] Phone: [phone]   Fax:  [phone]  Physical Therapy Treatment  Patient Details  Name: Candace Shaw MRN: [id number] Date of Birth: March 05, 1996 Referring Provider: Dr. Olevia Bowens. [name]  Encounter Date: 07/15/2016      PT End of Session - 07/15/16 1648    Visit Number 3   Date for PT Re-Evaluation 09/02/16   PT Start Time [id number]   PT Stop Time [id number]   PT Time Calculation (min) 50 min   Activity Tolerance Patient tolerated treatment well      Past Medical History:  Diagnosis Date  . Asthma   . Bipolar disorder ([place])   . Depression   . Hallucinogen dependence, unspecified 10/03/2012  . Headache(784.0)   . Obesity   . Personality disorder     Past Surgical History:  Procedure Laterality Date  . WISDOM TOOTH EXTRACTION  Age 20    There were no vitals filed for this visit.      Subjective Assessment - 07/15/16 1619    Subjective I feel really good the day of but then the next day it wears off and it hurts as bad as it did.     Currently in Pain? Yes   Pain Score 7    Pain Location Neck   Pain Orientation Left   Pain Type Chronic pain   Pain Frequency Constant                         [facility] Adult PT Treatment/Exercise - 07/15/16 0001      Moist Heat Therapy   Number Minutes Moist Heat 15 Minutes   Moist Heat Location Shoulder;Cervical     Electrical Stimulation   Electrical Stimulation Location left shoulder/neck   Electrical Stimulation Action Hi-Volt   Electrical Stimulation Parameters 160 V 15 min supine with moist heat   Electrical Stimulation Goals Pain     Manual Therapy   Soft tissue mobilization soft tissue work to left upper trap, left rhomboid, left scapular muscles in  prone  rhomboids and infraspinatus   McConnell Upper trap inhibition left          Trigger Point Dry Needling - 07/15/16 1647    Consent  Given? Yes   Muscles Treated Upper Body Rhomboids;Infraspinatus  left cervical multifidi   Upper Trapezius Response Twitch reponse elicited;Palpable increased muscle length   Rhomboids Response Palpable increased muscle length   Infraspinatus Response Palpable increased muscle length              PT Education - 07/15/16 1646    Education provided Yes   Education Details strapping tape info per patient request   Person(s) Educated Patient   Methods Explanation;Handout   Comprehension Verbalized understanding      Patient states she plans to order tape when she gets home.      PT Short Term Goals - 07/15/16 1655      PT SHORT TERM GOAL #1   Title indenpendent with initial HEP   Time 4   Period Weeks   Status On-going     PT SHORT TERM GOAL #2   Title pain in left shoulder decreased >/= 25% due to decreased in muscle spasms and twitching   Time 4   Period Weeks   Status On-going     PT SHORT TERM GOAL #3   Title popping noise in left shoulder decreased >/= 25%  due to decreased involuntary movement   Time 4   Period Weeks   Status On-going     PT SHORT TERM GOAL #4   Title pain with sleeping decreased >/= 25% due to understanding ways to sleep to decrease strain on left shoulder   Time 4   Period Weeks   Status On-going           PT Long Term Goals - 07/15/16 1656      PT LONG TERM GOAL #1   Title independent with HEP   Time 8   Period Weeks   Status On-going     PT LONG TERM GOAL #2   Title reduce pain in left shoulder with daily activities >/= 75%   Time 8   Period Weeks   Status On-going     PT LONG TERM GOAL #3   Title reduction of popping noise in left shoulder reduced >/= 75% due to reduction of trigger points and twitching   Time 8   Period Weeks   Status On-going     PT LONG TERM GOAL #4   Title ability to fall asleep and stay asleep with >/= 75% greater ease due to decreased pain   Time 8   Period Weeks   Status On-going     PT  LONG TERM GOAL #5   Title left shoulder AROM for flexion and abduction  increases by 20 degrees due to strength increases >/= 4+/5   Time 8   Period Weeks   Status On-going     PT LONG TERM GOAL #6   Title reach behind her back with left arm with pain decreased >/= 50% and increased shoulder mobility to dress herself with greater ease   Time 8   Period Weeks   Status On-going               Plan - 07/15/16 1648    Clinical Impression Statement The patient reports short term pain  relief and twitching from dry needling and taping for 1 day before symptoms return to baseline level.  She denies excessive soreness and requests this intervention again today despite it being just 2 days since last session.   Added treatment to rhomboids and infraspinatus.   Following treatment session, improved muscle length noted.  Therapist closely monitoring response with all interventions.     PT Next Visit Plan if continues to return to baseline status will consider adding e-stimulation with dry needles (discussed with patient today);  assess response to DN#3;  left RTC ex, scapular strengthening to correct muscle balances;  assess response to hi-volt stim and upper trap inhibition taping      Patient will benefit from skilled therapeutic intervention in order to improve the following deficits and impairments:     Visit Diagnosis: Chronic left shoulder pain  Muscle weakness (generalized)  Stiffness of left shoulder, not elsewhere classified     Problem List Patient Active Problem List   Diagnosis Date Noted  . Nonallopathic lesion of thoracic region 06/30/2016  . Nonallopathic lesion of rib cage 06/30/2016  . Nonallopathic lesion of cervical region 06/30/2016  . Scapular dyskinesis 04/29/2016  . Subacromial bursitis 04/29/2016  . Substance abuse in remission 03/28/2015  . Headache(784.0) 12/14/2013  . OCP (oral contraceptive pills) initiation 08/15/2013  . Febrile respiratory illness  02/13/2013  . Smoker 02/13/2013  . Depression 12/05/2012  . Tobacco user 08/19/2012  . Exercise-induced asthma 04/03/2011   Ruben Im, PT 07/15/16 5:04 PM Phone: [phone]  Fax: [phone] Alvera Singh 07/15/2016, 4:58 PM  [facility] [address], [place], [place], [address] Phone: [phone]   Fax:  [phone]  Name: Candace Shaw MRN: [id number] Date of Birth: 05-05-1996

## 2016-07-19 ENCOUNTER — Ambulatory Visit: Payer: BC Managed Care – PPO

## 2016-07-19 DIAGNOSIS — M25512 Pain in left shoulder: Principal | ICD-10-CM

## 2016-07-19 DIAGNOSIS — G8929 Other chronic pain: Secondary | ICD-10-CM

## 2016-07-19 DIAGNOSIS — M6281 Muscle weakness (generalized): Secondary | ICD-10-CM

## 2016-07-19 DIAGNOSIS — M25612 Stiffness of left shoulder, not elsewhere classified: Secondary | ICD-10-CM

## 2016-07-19 NOTE — Therapy (Signed)
[facility] [address], [place], [place], [address] Phone: [phone]   Fax:  [phone]  Physical Therapy Treatment  Patient Details  Name: Candace Shaw MRN: [id number] Date of Birth: [birth year] Referring Provider: Dr. Olevia Bowens. [name]  Encounter Date: 07/19/2016      PT End of Session - 07/19/16 1005    Visit Number 4   Date for PT Re-Evaluation 09/02/16   PT Start Time 0932  dry needling   PT Stop Time 1020   PT Time Calculation (min) 48 min   Activity Tolerance Patient tolerated treatment well   Behavior During Therapy [facility] for tasks assessed/performed      Past Medical History:  Diagnosis Date  . Asthma   . Bipolar disorder ([place])   . Depression   . Hallucinogen dependence, unspecified 10/03/2012  . Headache(784.0)   . Obesity   . Personality disorder     Past Surgical History:  Procedure Laterality Date  . WISDOM TOOTH EXTRACTION  Age 55    There were no vitals filed for this visit.      Subjective Assessment - 07/19/16 0935    Subjective Pt reports that her neck feels better for 1 day after needling and the involuntary "tick" causes pain to return.  Tape helped.    Patient Stated Goals reduce pain in left shoulder; shoulder not pop out of place when move it   Currently in Pain? Yes   Pain Score 5    Pain Location Neck   Pain Orientation Left   Pain Descriptors / Indicators Sharp;Dull   Pain Type Chronic pain   Pain Onset More than a month ago   Pain Frequency Constant   Aggravating Factors  coffee, sometimes unsure   Pain Relieving Factors tape, test   Multiple Pain Sites No                         [facility] Adult PT Treatment/Exercise - 07/19/16 0001      Exercises   Exercises Neck     Moist Heat Therapy   Number Minutes Moist Heat 15 Minutes   Moist Heat Location Shoulder;Cervical     Electrical Stimulation   Electrical Stimulation Location left  shoulder/neck   Electrical Stimulation Action IFC   Electrical Stimulation Parameters 15 minutes   Electrical Stimulation Goals Pain     Manual Therapy   Manual Therapy Myofascial release;Scapular mobilization;Taping   Soft tissue mobilization soft tissue work to left upper trap, left rhomboid, left scapular muscles in  prone  rhomboids and infraspinatus   McConnell Upper trap inhibition left          Trigger Point Dry Needling - 07/19/16 1004    Consent Given? Yes   Muscles Treated Upper Body Upper trapezius;Rhomboids;Levator scapulae   Upper Trapezius Response Twitch reponse elicited;Palpable increased muscle length   Levator Scapulae Response Twitch response elicited;Palpable increased muscle length   Rhomboids Response Twitch response elicited;Palpable increased muscle length   Infraspinatus Response Twitch response elicited;Palpable increased muscle length                PT Short Term Goals - 07/19/16 [id number]      PT SHORT TERM GOAL #1   Title indenpendent with initial HEP   Time 4   Period Weeks   Status Achieved     PT SHORT TERM GOAL #2   Title pain in left shoulder decreased >/= 25% due to decreased in  muscle spasms and twitching   Time 4   Period Weeks   Status On-going     PT SHORT TERM GOAL #3   Title popping noise in left shoulder decreased >/= 25% due to decreased involuntary movement   Time 4   Period Weeks   Status On-going           PT Long Term Goals - 07/15/16 1656      PT LONG TERM GOAL #1   Title independent with HEP   Time 8   Period Weeks   Status On-going     PT LONG TERM GOAL #2   Title reduce pain in left shoulder with daily activities >/= 75%   Time 8   Period Weeks   Status On-going     PT LONG TERM GOAL #3   Title reduction of popping noise in left shoulder reduced >/= 75% due to reduction of trigger points and twitching   Time 8   Period Weeks   Status On-going     PT LONG TERM GOAL #4   Title ability to fall  asleep and stay asleep with >/= 75% greater ease due to decreased pain   Time 8   Period Weeks   Status On-going     PT LONG TERM GOAL #5   Title left shoulder AROM for flexion and abduction  increases by 20 degrees due to strength increases >/= 4+/5   Time 8   Period Weeks   Status On-going     PT LONG TERM GOAL #6   Title reach behind her back with left arm with pain decreased >/= 50% and increased shoulder mobility to dress herself with greater ease   Time 8   Period Weeks   Status On-going               Plan - 07/19/16 0959    Clinical Impression Statement Pt with 1 day of redueced symptoms after dry needling.  Pt reports that taping helped to reduce the amount of twitching of the Lt shoulder blade.  Pt with continued Rt scapular and upper trap tension.  Pt emotional during today's treatment.  Pt will continue to benefit form skilled PT to reduce trigger points and tension in Lt neck and improve scapular strength and flexibility.     Rehab Potential Good   Clinical Impairments Affecting Rehab Potential involuntary twitch of left shoulder   PT Frequency 2x / week   PT Duration 8 weeks   PT Treatment/Interventions Cryotherapy;Electrical Stimulation;Iontophoresis 4mg /ml Dexamethasone;Moist Heat;Ultrasound;Patient/family education;Neuromuscular re-education;Therapeutic exercise;Therapeutic activities;Manual techniques;Dry needling;Taping;Passive range of motion   PT Next Visit Plan Try to begin gentle scapular strengthening, trigger point release to Lt neck/upper trap.  Modalities as needed.     Consulted and Agree with Plan of Care Patient      Patient will benefit from skilled therapeutic intervention in order to improve the following deficits and impairments:  Increased fascial restricitons, Increased muscle spasms, Decreased endurance, Decreased activity tolerance, Pain, Decreased mobility, Decreased strength  Visit Diagnosis: Chronic left shoulder pain  Muscle weakness  (generalized)  Stiffness of left shoulder, not elsewhere classified     Problem List Patient Active Problem List   Diagnosis Date Noted  . Nonallopathic lesion of thoracic region 06/30/2016  . Nonallopathic lesion of rib cage 06/30/2016  . Nonallopathic lesion of cervical region 06/30/2016  . Scapular dyskinesis 04/29/2016  . Subacromial bursitis 04/29/2016  . Substance abuse in remission 03/28/2015  . Headache(784.0) 12/14/2013  . OCP (  oral contraceptive pills) initiation 08/15/2013  . Febrile respiratory illness 02/13/2013  . Smoker 02/13/2013  . Depression 12/05/2012  . Tobacco user 08/19/2012  . Exercise-induced asthma 04/03/2011     Sigurd Sos, PT 07/19/16 10:09 AM  [facility] [address], [place], [place], [address] Phone: [phone]   Fax:  [phone]  Name: Camdynn Snipes MRN: [id number] Date of Birth: 1995-08-27

## 2016-07-21 ENCOUNTER — Ambulatory Visit: Payer: BC Managed Care – PPO | Admitting: Physical Therapy

## 2016-07-21 ENCOUNTER — Encounter: Payer: Self-pay | Admitting: Physical Therapy

## 2016-07-21 DIAGNOSIS — G8929 Other chronic pain: Secondary | ICD-10-CM

## 2016-07-21 DIAGNOSIS — M6281 Muscle weakness (generalized): Secondary | ICD-10-CM

## 2016-07-21 DIAGNOSIS — M25512 Pain in left shoulder: Principal | ICD-10-CM

## 2016-07-21 DIAGNOSIS — M25612 Stiffness of left shoulder, not elsewhere classified: Secondary | ICD-10-CM

## 2016-07-21 NOTE — Therapy (Signed)
[facility] [address], [place], [place], [address] Phone: [phone]   Fax:  [phone]  Physical Therapy Treatment  Patient Details  Name: Candace Shaw MRN: [id number] Date of Birth: 07/22/1996 Referring Provider: Dr. Olevia Bowens. [name]  Encounter Date: 07/21/2016      PT End of Session - 07/21/16 0935    Visit Number 5   Date for PT Re-Evaluation 09/02/16   PT Start Time 0930   PT Stop Time 1022   PT Time Calculation (min) 52 min   Activity Tolerance Patient tolerated treatment well   Behavior During Therapy [facility] for tasks assessed/performed      Past Medical History:  Diagnosis Date  . Asthma   . Bipolar disorder ([place])   . Depression   . Hallucinogen dependence, unspecified 10/03/2012  . Headache(784.0)   . Obesity   . Personality disorder     Past Surgical History:  Procedure Laterality Date  . WISDOM TOOTH EXTRACTION  Age 20    There were no vitals filed for this visit.      Subjective Assessment - 07/21/16 0933    Subjective Pt reports having trouble sleeping last night due to Lt upper trap tightness   Currently in Pain? Yes   Pain Score 7    Pain Location Neck   Pain Orientation Left   Pain Descriptors / Indicators Dull;Sharp   Pain Type Chronic pain   Pain Onset More than a month ago   Pain Frequency Constant                         [facility] Adult PT Treatment/Exercise - 07/21/16 0001      Exercises   Exercises Shoulder     Shoulder Exercises: Prone   Extension Strengthening;Left;20 reps   Other Prone Exercises Prone Row x20     Shoulder Exercises: Standing   Extension Strengthening;Both;20 reps  #15   Retraction Strengthening;Both;10 reps  #10     Moist Heat Therapy   Number Minutes Moist Heat 15 Minutes   Moist Heat Location Shoulder;Cervical     Electrical Stimulation   Electrical Stimulation Location left shoulder/neck   Electrical Stimulation  Action IFC   Electrical Stimulation Parameters To tolerance   Electrical Stimulation Goals Pain     Manual Therapy   Soft tissue mobilization soft tissue work to left upper trap, left rhomboid, left scapular muscles in  prone  rhomboids and infraspinatus   McConnell Upper trap inhibition left                PT Education - 07/21/16 1012    Education provided Yes   Education Details Scapular stability    Person(s) Educated Patient   Methods Explanation;Handout   Comprehension Verbalized understanding          PT Short Term Goals - 07/21/16 0935      PT SHORT TERM GOAL #1   Title indenpendent with initial HEP   Time 4   Period Weeks   Status Achieved     PT SHORT TERM GOAL #2   Title pain in left shoulder decreased >/= 25% due to decreased in muscle spasms and twitching   Time 4   Period Weeks   Status On-going     PT SHORT TERM GOAL #3   Time 4   Period Weeks   Status On-going     PT SHORT TERM GOAL #4   Title pain with sleeping  decreased >/= 25% due to understanding ways to sleep to decrease strain on left shoulder   Time 4   Period Weeks   Status On-going           PT Long Term Goals - 07/21/16 0935      PT LONG TERM GOAL #1   Title independent with HEP   Time 8   Period Weeks   Status On-going     PT LONG TERM GOAL #2   Title reduce pain in left shoulder with daily activities >/= 75%   Time 8   Period Weeks   Status On-going     PT LONG TERM GOAL #3   Title reduction of popping noise in left shoulder reduced >/= 75% due to reduction of trigger points and twitching   Time 8   Period Weeks   Status On-going     PT LONG TERM GOAL #4   Title ability to fall asleep and stay asleep with >/= 75% greater ease due to decreased pain   Time 8   Period Weeks   Status On-going     PT LONG TERM GOAL #6   Title reach behind her back with left arm with pain decreased >/= 50% and increased shoulder mobility to dress herself with greater ease    Time 8   Period Weeks   Status On-going               Plan - 07/21/16 1019    Clinical Impression Statement Pt reports having Lt shoulder tightness, dry needling helped but then tightness returned. Pt has decreased strength and Lt low and middle traps. Difficulty initiating movement with muscle isolation for lower and middle traps. Pt had myofascial adhesions in Lt upper trap with manual soft tissue mobiliation. Pt will continue to benefit from skilled therapy for scapular strength and stability.    Rehab Potential Good   Clinical Impairments Affecting Rehab Potential involuntary twitch of left shoulder   PT Frequency 2x / week   PT Duration 8 weeks   PT Treatment/Interventions Cryotherapy;Electrical Stimulation;Iontophoresis 4mg /ml Dexamethasone;Moist Heat;Ultrasound;Patient/family education;Neuromuscular re-education;Therapeutic exercise;Therapeutic activities;Manual techniques;Dry needling;Taping;Passive range of motion   PT Next Visit Plan Continue gentle scapular strength, work internal and external rotation   PT Home Exercise Plan scapular strengthening   Consulted and Agree with Plan of Care Patient      Patient will benefit from skilled therapeutic intervention in order to improve the following deficits and impairments:  Increased fascial restricitons, Increased muscle spasms, Decreased endurance, Decreased activity tolerance, Pain, Decreased mobility, Decreased strength  Visit Diagnosis: Chronic left shoulder pain  Muscle weakness (generalized)  Stiffness of left shoulder, not elsewhere classified     Problem List Patient Active Problem List   Diagnosis Date Noted  . Nonallopathic lesion of thoracic region 06/30/2016  . Nonallopathic lesion of rib cage 06/30/2016  . Nonallopathic lesion of cervical region 06/30/2016  . Scapular dyskinesis 04/29/2016  . Subacromial bursitis 04/29/2016  . Substance abuse in remission 03/28/2015  . Headache(784.0) 12/14/2013  .  OCP (oral contraceptive pills) initiation 08/15/2013  . Febrile respiratory illness 02/13/2013  . Smoker 02/13/2013  . Depression 12/05/2012  . Tobacco user 08/19/2012  . Exercise-induced asthma 04/03/2011    Mikle Bosworth PTA 07/21/2016, 10:28 AM  [facility] [address] [place], [place], [address] Phone: [phone]   Fax:  [phone]  Name: Candace Shaw MRN: [id number] Date of Birth: 1996/06/19

## 2016-07-21 NOTE — Patient Instructions (Signed)
Extension - Prone ([place])    Lie with right arm hanging off side of bed. Lift hand back and up. Repeat __10__ times per set. Do __3__ sets per session. Do __3__ sessions per week. Use __0-1__ lb weight.   Copyright  VHI. All rights reserved.  Abduction: Horizontal - Prone ([place])    Lie with right arm hanging down. Lift arm out to side, thumb up. Repeat _10___ times per set. Do __3__ sets per session. Do _3___ sessions per week. Use _0-1___ lb weight.   Copyright  VHI. All rights reserved.  Mikle Bosworth, PTA 07/21/16 10:12 AM  [facility] [address], [place], [place] [address] Phone # [phone] Fax [phone]

## 2016-07-21 NOTE — Progress Notes (Signed)
[facility] [place], [place] [address] Phone: [phone] Subjective:    I'm seeing this patient by the request  of:  Lottie Dawson, MD   CC: shoulder pain,  Left  [id number]  Candace Shaw is a 20 y.o. female coming in with complaint of left shoulder pain.patient states that she has been having involuntary movement of her left shoulder.Patient was seen previously and did have what appeared to be more of a scapular dyskinesia and mild subacromial bursitis. Patient was seen last month and was started in formal physical therapy. Patient has made some improvement. Patient states Unfortunate she has not made any significant improvement. Continues to have the movement. Does not feel that the muscle imbalances is concerning factor at this time. Feels that the osteopathic manipulation has not been helping anymore either. Feels that the gabapentin is possibly causing her to feel more depressed not like herself. Patient has had a history of bipolar as well as depression and wants to avoid this as well.    Past Medical History:  Diagnosis Date  . Asthma   . Bipolar disorder ([place])   . Depression   . Hallucinogen dependence, unspecified 10/03/2012  . Headache(784.0)   . Obesity   . Personality disorder    Past Surgical History:  Procedure Laterality Date  . WISDOM TOOTH EXTRACTION  Age 45   Social History   Social History  . Marital status: Single    Spouse name: N/A  . Number of children: N/A  . Years of education: N/A   Occupational History  . coffee shop     Social History Main Topics  . Smoking status: Current Every Day Smoker    Packs/day: 0.50    Years: 4.00    Types: Cigarettes  . Smokeless tobacco: Never Used  . 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 Drug use: No     Comment: Acid, mushrooms, DNT  . Sexual activity: Not Currently    Partners: Female    Birth  control/ protection: Condom, OCP, Pill   Other Topics Concern  . None   Social History Narrative   Legal Guardians: Herbie [place] and [place] of 3   Cat   [place]   Lives with mom    Allergies  Allergen Reactions  . Zolpidem Tartrate Other (See Comments)    Hallucinations   Family History  Problem Relation Age of Onset  . Adopted: Yes  . Alcohol abuse Mother   . Alcohol abuse Father     Past medical history, social, surgical and family history all reviewed in electronic medical record.  No pertanent information unless stated regarding to the chief complaint.   Denies fever, chills, nausea vomiting abdominal pain, dysuria, chest pain, shortness of breath dyspnea on exertion or numbness in extremities   Objective  Blood pressure 110/84, pulse 72, height 5\' 1"  (1.549 m), weight 152 lb 12.8 oz (69.3 kg).  Systems examined below as of 07/22/16 General: NAD A&O x3 mood, affect normal  HEENT: Pupils equal, extraocular movements intact no nystagmus Respiratory: not short of breath at rest or with speaking Cardiovascular: No lower extremity edema, non tender Skin: Warm dry intact with no signs of infection or rash on extremities or on axial skeleton. Abdomen: Soft nontender, no masses Neuro: Cranial nerves  intact, neurovascularly intact in  all extremities with 2+ DTRs and 2+ pulses. Lymph: No lymphadenopathy appreciated today  Gait normal with good balance and coordination.  MSK: Non tender with full range of motion and good stability and symmetric strength and tone of elbows, wrist,  knee hips and ankles bilaterally.    Shoulder: left Inspection reveals no abnormalities, atrophy or asymmetry. Still moderately discomfort in the upper left shoulder ROM is full in all planes passively. Rotator cuff strength normal throughout. Minimal Speeds and Yergason's tests normal. No labral pathology noted with negative Obrien's, negative clunk  and good stability. Severe scapular dyskinesis still noted. Patient does have what appears to be somewhat involuntary movements of the shoulder blade No painful arc and no drop arm sign. No apprehension sign Contralateral shoulder unremarkable No significant change from previous exam   Impression and Recommendations:     This case required medical decision making of moderate complexity.      Note: This dictation was prepared with Dragon dictation along with smaller phrase technology. Any transcriptional errors that result from this process are unintentional.

## 2016-07-22 ENCOUNTER — Encounter: Payer: Self-pay | Admitting: Family Medicine

## 2016-07-22 ENCOUNTER — Ambulatory Visit (INDEPENDENT_AMBULATORY_CARE_PROVIDER_SITE_OTHER): Payer: BC Managed Care – PPO | Admitting: Family Medicine

## 2016-07-22 DIAGNOSIS — G2589 Other specified extrapyramidal and movement disorders: Secondary | ICD-10-CM | POA: Diagnosis not present

## 2016-07-22 MED ORDER — VENLAFAXINE HCL ER 37.5 MG PO CP24: 37.5000 mg | ORAL_CAPSULE | Freq: Every day | ORAL | 1 refills | Status: DC

## 2016-07-22 NOTE — Assessment & Plan Note (Signed)
Discussed with patient again. Differential includes a tardive dyskinesia. Patient feels that any type of medications such as the anticonvulsants could be contributing to this and does not want to take them. At this point we discussed that this could be something where a neuromodulator could be more beneficial. Having some mild depression feelings with gabapentin so we will change her to Effexor. Warned the potential side effects and with patient having a history of bipolar she will monitor closely. Patient will send a message in 2 weeks and follow-up with me again in 4 weeks. We will hold on any more osteopathic manipulation at this time. Spent  25 minutes with patient face-to-face and had greater than 50% of counseling including as described above in assessment and plan.

## 2016-07-22 NOTE — Patient Instructions (Addendum)
Good to see you  Effexor 37.5 mg daily  Stop the gabapentin slowly.  I am hoping we can reset the nerve and see how it goes.  See me again in 3 weeks Happy holidays!

## 2016-07-26 ENCOUNTER — Ambulatory Visit: Payer: BC Managed Care – PPO | Admitting: Physical Therapy

## 2016-07-26 ENCOUNTER — Encounter: Payer: Self-pay | Admitting: Physical Therapy

## 2016-07-26 DIAGNOSIS — M25612 Stiffness of left shoulder, not elsewhere classified: Secondary | ICD-10-CM

## 2016-07-26 DIAGNOSIS — M25512 Pain in left shoulder: Principal | ICD-10-CM

## 2016-07-26 DIAGNOSIS — G8929 Other chronic pain: Secondary | ICD-10-CM

## 2016-07-26 DIAGNOSIS — M6281 Muscle weakness (generalized): Secondary | ICD-10-CM

## 2016-07-26 NOTE — Therapy (Signed)
[facility] [address], [place], [place], [address] Phone: [phone]   Fax:  [phone]  Physical Therapy Treatment  Patient Details  Name: Candace Shaw MRN: [id number] Date of Birth: 13-Aug-1995 Referring Provider: Dr. Olevia Bowens. [name]  Encounter Date: 07/26/2016      PT End of Session - 07/26/16 1009    Visit Number 6   Date for PT Re-Evaluation 09/02/16   PT Start Time 0935   PT Stop Time 1030   PT Time Calculation (min) 55 min   Behavior During Therapy [facility] for tasks assessed/performed      Past Medical History:  Diagnosis Date  . Asthma   . Bipolar disorder ([place])   . Depression   . Hallucinogen dependence, unspecified 10/03/2012  . Headache(784.0)   . Obesity   . Personality disorder     Past Surgical History:  Procedure Laterality Date  . WISDOM TOOTH EXTRACTION  Age 7    There were no vitals filed for this visit.      Subjective Assessment - 07/26/16 0941    Subjective NO change in left shoulder pain.  Dry needling helps for 1 day at most.  When I do not sleep, the twitching gets worse. No change in popping shoulder in and out of place.    Patient Stated Goals reduce pain in left shoulder; shoulder not pop out of place when move it   Currently in Pain? Yes   Pain Score 6    Pain Location Neck  left shoulder   Pain Orientation Left   Pain Descriptors / Indicators Dull;Sharp   Pain Type Chronic pain   Pain Onset More than a month ago   Pain Frequency Constant   Aggravating Factors  coffee, sometimes unsure   Pain Relieving Factors tape helps for the day it is taped du eto forcing her to hold her shoulder down   Multiple Pain Sites No                         [facility] Adult PT Treatment/Exercise - 07/26/16 0001      Shoulder Exercises: Prone   Extension Strengthening;Left;10 reps;Weights  hold 5 sec; bring scapula down and back   Extension Weight (lbs) 1   Horizontal ABduction 1 Strengthening;Left;10 reps;Limitations   Horizontal ABduction 1 Limitations therapist guided scapula down and retract   Other Prone Exercises Prone Row x20 with 2# hold 5 sec and not twitching     Shoulder Exercises: Standing   Extension Strengthening;Both;20 reps  #15   Extension Limitations VC to contract abdominals and posture   Retraction Strengthening;Both;10 reps  #10   Retraction Limitations upright posture; retraction of scapula     Modalities   Modalities Electrical Stimulation;Moist Heat     Moist Heat Therapy   Number Minutes Moist Heat 15 Minutes   Moist Heat Location Shoulder;Cervical     Electrical Stimulation   Electrical Stimulation Location left shoulder/neck   Electrical Stimulation Action IFC   Electrical Stimulation Parameters to patient tolerance ; 15 min   Electrical Stimulation Goals Pain     Manual Therapy   Manual Therapy Taping   McConnell Upper trap inhibition left                PT Education - 07/26/16 [phone]    Education provided Yes   Education Details how to inhibit left upper trap with McConnel tape   Person(s) Educated Patient   Methods  Explanation;Other (comment)  took pictures with her phone   Comprehension Verbalized understanding          PT Short Term Goals - 07/26/16 0954      PT SHORT TERM GOAL #2   Title pain in left shoulder decreased >/= 25% due to decreased in muscle spasms and twitching   Time 4   Period Weeks   Status On-going  no change     PT SHORT TERM GOAL #3   Title popping noise in left shoulder decreased >/= 25% due to decreased involuntary movement   Time 4   Period Weeks   Status On-going  during therapy was reduction from initial evaluation with frequency     PT SHORT TERM GOAL #4   Title pain with sleeping decreased >/= 25% due to understanding ways to sleep to decrease strain on left shoulder   Time 4   Period Weeks   Status On-going           PT Long Term Goals  - 07/21/16 0935      PT LONG TERM GOAL #1   Title independent with HEP   Time 8   Period Weeks   Status On-going     PT LONG TERM GOAL #2   Title reduce pain in left shoulder with daily activities >/= 75%   Time 8   Period Weeks   Status On-going     PT LONG TERM GOAL #3   Title reduction of popping noise in left shoulder reduced >/= 75% due to reduction of trigger points and twitching   Time 8   Period Weeks   Status On-going     PT LONG TERM GOAL #4   Title ability to fall asleep and stay asleep with >/= 75% greater ease due to decreased pain   Time 8   Period Weeks   Status On-going     PT LONG TERM GOAL #6   Title reach behind her back with left arm with pain decreased >/= 50% and increased shoulder mobility to dress herself with greater ease   Time 8   Period Weeks   Status On-going               Plan - 07/26/16 1013    Clinical Impression Statement Patient is able to pop her left shoulder less during therapy. Patient is sitting upright better.  Patient needs verbal cues on contracting her abdominals and her posture during exercises in standing.  During prone exericses patient needed assistance in correct movement of left scapula.  Patient will benefit from skilled therapy to improve left scapula strength.    Rehab Potential Good   Clinical Impairments Affecting Rehab Potential involuntary twitch of left shoulder   PT Frequency 2x / week   PT Duration 8 weeks   PT Treatment/Interventions Cryotherapy;Electrical Stimulation;Iontophoresis 4mg /ml Dexamethasone;Moist Heat;Ultrasound;Patient/family education;Neuromuscular re-education;Therapeutic exercise;Therapeutic activities;Manual techniques;Dry needling;Taping;Passive range of motion   PT Next Visit Plan Continue gentle scapular strength, work internal and external rotation; tape shoulder prior to exercise; end with electrical stimulation   PT Home Exercise Plan scapular strengthening   Consulted and Agree with  Plan of Care Patient      Patient will benefit from skilled therapeutic intervention in order to improve the following deficits and impairments:  Increased fascial restricitons, Increased muscle spasms, Decreased endurance, Decreased activity tolerance, Pain, Decreased mobility, Decreased strength  Visit Diagnosis: Chronic left shoulder pain  Muscle weakness (generalized)  Stiffness of left shoulder, not elsewhere classified  Problem List Patient Active Problem List   Diagnosis Date Noted  . Nonallopathic lesion of thoracic region 06/30/2016  . Nonallopathic lesion of rib cage 06/30/2016  . Nonallopathic lesion of cervical region 06/30/2016  . Scapular dyskinesis 04/29/2016  . Subacromial bursitis 04/29/2016  . Substance abuse in remission 03/28/2015  . Headache(784.0) 12/14/2013  . OCP (oral contraceptive pills) initiation 08/15/2013  . Febrile respiratory illness 02/13/2013  . Smoker 02/13/2013  . Depression 12/05/2012  . Tobacco user 08/19/2012  . Exercise-induced asthma 04/03/2011    Earlie Counts, PT 07/26/16 10:17 AM   [facility] [address] [place], [place], [address] Phone: [phone]   Fax:  [phone]  Name: Candace Shaw MRN: [id number] Date of Birth: 1995-11-21

## 2016-07-28 ENCOUNTER — Ambulatory Visit: Payer: BC Managed Care – PPO

## 2016-07-28 DIAGNOSIS — M6281 Muscle weakness (generalized): Secondary | ICD-10-CM

## 2016-07-28 DIAGNOSIS — M25512 Pain in left shoulder: Secondary | ICD-10-CM | POA: Diagnosis not present

## 2016-07-28 DIAGNOSIS — G8929 Other chronic pain: Secondary | ICD-10-CM

## 2016-07-28 DIAGNOSIS — M25612 Stiffness of left shoulder, not elsewhere classified: Secondary | ICD-10-CM

## 2016-07-28 NOTE — Therapy (Signed)
[facility] [address] [place], [place], [address] Phone: [phone]   Fax:  [phone]  Physical Therapy Treatment  Patient Details  Name: Candace Shaw MRN: [id number] Date of Birth: 1996/04/21 Referring Provider: Dr. Olevia Bowens. [name]  Encounter Date: 07/28/2016      PT End of Session - 07/28/16 1006    Visit Number 7   Date for PT Re-Evaluation 09/02/16   PT Start Time 0932   PT Stop Time 1018   PT Time Calculation (min) 46 min   Activity Tolerance Patient tolerated treatment well   Behavior During Therapy [facility] for tasks assessed/performed      Past Medical History:  Diagnosis Date  . Asthma   . Bipolar disorder ([place])   . Depression   . Hallucinogen dependence, unspecified 10/03/2012  . Headache(784.0)   . Obesity   . Personality disorder     Past Surgical History:  Procedure Laterality Date  . WISDOM TOOTH EXTRACTION  Age 1    There were no vitals filed for this visit.      Subjective Assessment - 07/28/16 0936    Subjective More achy today because I have a cold.  Taping is helping to keep shoulder relaxed.     Patient Stated Goals reduce pain in left shoulder; shoulder not pop out of place when move it   Currently in Pain? Yes   Pain Score 7    Pain Location Neck   Pain Orientation Left   Pain Descriptors / Indicators Dull;Sharp   Pain Type Chronic pain   Pain Onset More than a month ago   Pain Frequency Constant                         [facility] Adult PT Treatment/Exercise - 07/28/16 0001      Neck Exercises: Supine   Other Supine Exercise supine on foam roll: decompression x 3 minutes then horizontal abduction with yellow band 2x10     Shoulder Exercises: Prone   Extension Strengthening;Left;10 reps;Weights  hold 5 sec; bring scapula down and back   Extension Weight (lbs) 2   Other Prone Exercises Prone Row x20 with 2# hold 5 sec and not twitching     Shoulder Exercises: Standing   Extension Strengthening;Both;20 reps  15#   Extension Limitations VC to contract abdominals and posture   Retraction Strengthening;Both;10 reps  10#   Retraction Limitations upright posture; retraction of scapula     Shoulder Exercises: ROM/Strengthening   UBE (Upper Arm Bike) Level 0 x 5 minutes forward  verbal cues for posture, tactile cues for Lt scapula     Modalities   Modalities Electrical Stimulation;Moist Heat     Moist Heat Therapy   Number Minutes Moist Heat 15 Minutes   Moist Heat Location Shoulder;Cervical     Electrical Stimulation   Electrical Stimulation Location left shoulder/neck   Electrical Stimulation Action IFC   Electrical Stimulation Parameters 15 minutes   Electrical Stimulation Goals Pain     Manual Therapy   [place]Connell Upper trap inhibition left     Neck Exercises: Stretches   Upper Trapezius Stretch 3 reps;20 seconds                  PT Short Term Goals - 07/26/16 0954      PT SHORT TERM GOAL #2   Title pain in left shoulder decreased >/= 25% due to decreased in muscle spasms and twitching  Time 4   Period Weeks   Status On-going  no change     PT SHORT TERM GOAL #3   Title popping noise in left shoulder decreased >/= 25% due to decreased involuntary movement   Time 4   Period Weeks   Status On-going  during therapy was reduction from initial evaluation with frequency     PT SHORT TERM GOAL #4   Title pain with sleeping decreased >/= 25% due to understanding ways to sleep to decrease strain on left shoulder   Time 4   Period Weeks   Status On-going           PT Long Term Goals - 07/21/16 0935      PT LONG TERM GOAL #1   Title independent with HEP   Time 8   Period Weeks   Status On-going     PT LONG TERM GOAL #2   Title reduce pain in left shoulder with daily activities >/= 75%   Time 8   Period Weeks   Status On-going     PT LONG TERM GOAL #3   Title reduction of popping  noise in left shoulder reduced >/= 75% due to reduction of trigger points and twitching   Time 8   Period Weeks   Status On-going     PT LONG TERM GOAL #4   Title ability to fall asleep and stay asleep with >/= 75% greater ease due to decreased pain   Time 8   Period Weeks   Status On-going     PT LONG TERM GOAL #6   Title reach behind her back with left arm with pain decreased >/= 50% and increased shoulder mobility to dress herself with greater ease   Time 8   Period Weeks   Status On-going               Plan - 07/28/16 0955    Clinical Impression Statement Pt with difficulty controlling Lt scapula with exercise.  Pt continues to have twitch in the Lt scapula especially with movement of the Lt UE.  Pt with increased pain today due to having a cold so limited exercise.  Pt will continue to benefit form skilled PT for scapular strength, postural education, flexibility and modalities/manual as needed for pain.     Rehab Potential Good   PT Frequency 2x / week   PT Duration 8 weeks   PT Treatment/Interventions Cryotherapy;Electrical Stimulation;Iontophoresis 4mg /ml Dexamethasone;Moist Heat;Ultrasound;Patient/family education;Neuromuscular re-education;Therapeutic exercise;Therapeutic activities;Manual techniques;Dry needling;Taping;Passive range of motion   PT Next Visit Plan Continue gentle scapular strength, work internal and external rotation; tape shoulder prior to exercise; end with electrical stimulation   Consulted and Agree with Plan of Care Patient      Patient will benefit from skilled therapeutic intervention in order to improve the following deficits and impairments:  Increased fascial restricitons, Increased muscle spasms, Decreased endurance, Decreased activity tolerance, Pain, Decreased mobility, Decreased strength  Visit Diagnosis: Chronic left shoulder pain  Muscle weakness (generalized)  Stiffness of left shoulder, not elsewhere classified     Problem  List Patient Active Problem List   Diagnosis Date Noted  . Nonallopathic lesion of thoracic region 06/30/2016  . Nonallopathic lesion of rib cage 06/30/2016  . Nonallopathic lesion of cervical region 06/30/2016  . Scapular dyskinesis 04/29/2016  . Subacromial bursitis 04/29/2016  . Substance abuse in remission 03/28/2015  . Headache(784.0) 12/14/2013  . OCP (oral contraceptive pills) initiation 08/15/2013  . Febrile respiratory illness 02/13/2013  .  Smoker 02/13/2013  . Depression 12/05/2012  . Tobacco user 08/19/2012  . Exercise-induced asthma 04/03/2011     Sigurd Sos, PT 07/28/16 10:09 AM  [facility] [address], [place], [place], [address] Phone: [phone]   Fax:  [phone]  Name: Chryl Wayson MRN: [id number] Date of Birth: Jul 29, 1996

## 2016-08-03 ENCOUNTER — Encounter: Payer: BC Managed Care – PPO | Admitting: Physical Therapy

## 2016-08-04 ENCOUNTER — Ambulatory Visit: Payer: BC Managed Care – PPO | Admitting: Physical Therapy

## 2016-08-04 ENCOUNTER — Encounter: Payer: Self-pay | Admitting: Physical Therapy

## 2016-08-04 DIAGNOSIS — M25612 Stiffness of left shoulder, not elsewhere classified: Secondary | ICD-10-CM

## 2016-08-04 DIAGNOSIS — M25512 Pain in left shoulder: Secondary | ICD-10-CM | POA: Diagnosis not present

## 2016-08-04 DIAGNOSIS — M6281 Muscle weakness (generalized): Secondary | ICD-10-CM

## 2016-08-04 DIAGNOSIS — G8929 Other chronic pain: Secondary | ICD-10-CM

## 2016-08-04 NOTE — Therapy (Signed)
[facility] [address] [place], [place], [address] Phone: [phone]   Fax:  [phone]  Physical Therapy Treatment  Patient Details  Name: Candace Shaw MRN: [id number] Date of Birth: [birth year] Referring Provider: Dr. Olevia Bowens. Tamala Candace Shaw  Encounter Date: 08/04/2016      PT End of Session - 08/04/16 1131    Visit Number 8   Date for PT Re-Evaluation 09/02/16   PT Start Time 1101   PT Stop Time 1200   PT Time Calculation (min) 59 min   Activity Tolerance Patient tolerated treatment well   Behavior During Therapy [facility] for tasks assessed/performed      Past Medical History:  Diagnosis Date  . Asthma   . Bipolar disorder ([place])   . Depression   . Hallucinogen dependence, unspecified 10/03/2012  . Headache(784.0)   . Obesity   . Personality disorder     Past Surgical History:  Procedure Laterality Date  . WISDOM TOOTH EXTRACTION  Age 12    There were no vitals filed for this visit.      Subjective Assessment - 08/04/16 1106    Subjective I am okay. My shoulder is sore.    Patient Stated Goals reduce pain in left shoulder; shoulder not pop out of place when move it   Currently in Pain? Yes   Pain Score 6    Pain Location Shoulder   Pain Orientation Left   Pain Descriptors / Indicators Sore   Pain Type Chronic pain   Pain Onset More than a month ago   Pain Frequency Constant   Aggravating Factors  coffee, sometimes unsure   Pain Relieving Factors tape helps for the day    Multiple Pain Sites No            [facility] PT Assessment - 08/04/16 0001      AROM   Left Shoulder Flexion 125 Degrees   Left Shoulder ABduction 124 Degrees                     [facility] Adult PT Treatment/Exercise - 08/04/16 0001      Neck Exercises: Supine   Other Supine Exercise supine on foam roll: decompression x 3 minutes then horizontal abduction with yellow band 2x10     Shoulder Exercises: Prone    Extension Strengthening;Left;10 reps;Weights  hold 5 sec; bring scapula down and back, 2#   Extension Weight (lbs) 2   Horizontal ABduction 1 Strengthening;Left;10 reps;Limitations   Horizontal ABduction 1 Limitations therapist guided scapula down and retract   Other Prone Exercises Prone Row x20 with 2# hold 5 sec and not twitching  therapist preventing patient from popping her shoulder     Shoulder Exercises: Standing   Extension Strengthening;Both;20 reps  15#   Extension Limitations VC to contract abdominals and posture   Retraction Strengthening;Both;10 reps  20#   Retraction Limitations upright posture; retraction of scapula     Shoulder Exercises: ROM/Strengthening   UBE (Upper Arm Bike) Level 0 x 5 minutes forward  verbal cues for posture, tactile cues for Lt scapula   Other ROM/Strengthening Exercises shoulder flexion with UE ranger 30x and gets to 130 degrees; shoulder abduction 30x and gets to 140 degrees then bring arm side to side like washing a wall 20x     Modalities   Modalities Electrical Stimulation;Moist Heat     Moist Heat Therapy   Number Minutes Moist Heat 20 Minutes   Moist Heat Location Shoulder;Cervical  Acupuncturist Location left shoulder/neck   Electrical Stimulation Action IFC   Electrical Stimulation Parameters to patient tolerance, 20 min   Electrical Stimulation Goals Pain                PT Education - 08/04/16 1131    Education provided No          PT Short Term Goals - 07/26/16 0954      PT SHORT TERM GOAL #2   Title pain in left shoulder decreased >/= 25% due to decreased in muscle spasms and twitching   Time 4   Period Weeks   Status On-going  no change     PT SHORT TERM GOAL #3   Title popping noise in left shoulder decreased >/= 25% due to decreased involuntary movement   Time 4   Period Weeks   Status On-going  during therapy was reduction from initial evaluation with frequency      PT SHORT TERM GOAL #4   Title pain with sleeping decreased >/= 25% due to understanding ways to sleep to decrease strain on left shoulder   Time 4   Period Weeks   Status On-going           PT Long Term Goals - 08/04/16 1109      PT LONG TERM GOAL #1   Title independent with HEP   Time 8   Period Weeks   Status On-going     PT LONG TERM GOAL #2   Title reduce pain in left shoulder with daily activities >/= 75%   Time 8   Period Weeks   Status On-going  decreased by 10%     PT LONG TERM GOAL #3   Title reduction of popping noise in left shoulder reduced >/= 75% due to reduction of trigger points and twitching   Time 8   Period Weeks   Status On-going  20% better     PT LONG TERM GOAL #4   Title ability to fall asleep and stay asleep with >/= 75% greater ease due to decreased pain   Time 8   Period Weeks   Status On-going     PT LONG TERM GOAL #5   Title left shoulder AROM for flexion and abduction  increases by 20 degrees due to strength increases >/= 4+/5   Time 8   Period Weeks   Status On-going     PT LONG TERM GOAL #6   Title reach behind her back with left arm with pain decreased >/= 50% and increased shoulder mobility to dress herself with greater ease   Time 8   Period Weeks   Status On-going  20% better               Plan - 08/04/16 1110    Clinical Impression Statement Patient reports pain has decreased by 20% and popping noise has decreased by 20%. Patient comes into therapy with her shoulder back more than forward. Patient has increased ROM of left shoulder when using the UE ranger.  Patient needs constant verbal cues to not pop her shoulder.  Patient needs tactle cues to retract her scapula with her exercises. Patient will benefit from skilled therapy to reduce pain and improve strength.    Rehab Potential Good   Clinical Impairments Affecting Rehab Potential involuntary twitch of left shoulder   PT Frequency 2x / week   PT Duration 8  weeks   PT Treatment/Interventions Cryotherapy;Electrical Stimulation;Iontophoresis 4mg /ml  Dexamethasone;Moist Heat;Ultrasound;Patient/family education;Neuromuscular re-education;Therapeutic exercise;Therapeutic activities;Manual techniques;Dry needling;Taping;Passive range of motion   PT Next Visit Plan Continue gentle scapular strength, work internal and external rotation; tape shoulder prior to exercise; end with electrical stimulation   PT Home Exercise Plan scapular strengthening   Consulted and Agree with Plan of Care Patient      Patient will benefit from skilled therapeutic intervention in order to improve the following deficits and impairments:  Increased fascial restricitons, Increased muscle spasms, Decreased endurance, Decreased activity tolerance, Pain, Decreased mobility, Decreased strength  Visit Diagnosis: Chronic left shoulder pain  Muscle weakness (generalized)  Stiffness of left shoulder, not elsewhere classified     Problem List Patient Active Problem List   Diagnosis Date Noted  . Nonallopathic lesion of thoracic region 06/30/2016  . Nonallopathic lesion of rib cage 06/30/2016  . Nonallopathic lesion of cervical region 06/30/2016  . Scapular dyskinesis 04/29/2016  . Subacromial bursitis 04/29/2016  . Substance abuse in remission 03/28/2015  . Headache(784.0) 12/14/2013  . OCP (oral contraceptive pills) initiation 08/15/2013  . Febrile respiratory illness 02/13/2013  . Smoker 02/13/2013  . Depression 12/05/2012  . Tobacco user 08/19/2012  . Exercise-induced asthma 04/03/2011    Earlie Counts, PT 08/04/16 11:38 AM   [facility] [address] [place], [place], [address] Phone: [phone]   Fax:  [phone]  Name: Mehnaz Knack MRN: [id number] Date of Birth: 1995/12/11

## 2016-08-05 ENCOUNTER — Ambulatory Visit: Payer: BC Managed Care – PPO

## 2016-08-05 DIAGNOSIS — M25512 Pain in left shoulder: Principal | ICD-10-CM

## 2016-08-05 DIAGNOSIS — M25612 Stiffness of left shoulder, not elsewhere classified: Secondary | ICD-10-CM

## 2016-08-05 DIAGNOSIS — G8929 Other chronic pain: Secondary | ICD-10-CM

## 2016-08-05 DIAGNOSIS — M6281 Muscle weakness (generalized): Secondary | ICD-10-CM

## 2016-08-05 NOTE — Therapy (Addendum)
[facility] [address] [place], [place], [address] Phone: [phone]   Fax:  [phone]  Physical Therapy Treatment  Patient Details  Name: Candace Shaw MRN: [id number] Date of Birth: Aug 24, 1995 Referring Provider: Dr. Olevia Bowens. [name]  Encounter Date: 08/05/2016      PT End of Session - 08/05/16 1005    Visit Number 9   Date for PT Re-Evaluation 09/02/16   PT Start Time 0925   PT Stop Time 1019   PT Time Calculation (min) 54 min   Activity Tolerance Patient tolerated treatment well   Behavior During Therapy [facility] for tasks assessed/performed      Past Medical History:  Diagnosis Date  . Asthma   . Bipolar disorder ([place])   . Depression   . Hallucinogen dependence, unspecified 10/03/2012  . Headache(784.0)   . Obesity   . Personality disorder     Past Surgical History:  Procedure Laterality Date  . WISDOM TOOTH EXTRACTION  Age 20    There were no vitals filed for this visit.      Subjective Assessment - 08/05/16 0928    Subjective I was a little sore after treatment yesterday.  Didn't sleep well last night.     Patient Stated Goals reduce pain in left shoulder; shoulder not pop out of place when move it   Currently in Pain? Yes   Pain Score 5    Pain Location Shoulder   Pain Orientation Left   Pain Descriptors / Indicators Spasm;Sore   Pain Type Chronic pain   Pain Onset More than a month ago   Pain Frequency Constant   Aggravating Factors  sometimes unsure                         [facility] Adult PT Treatment/Exercise - 08/05/16 0001      Neck Exercises: Supine   Other Supine Exercise supine on foam roll: decompression x 3 minutes then horizontal abduction with yellow band 2x10, then overhead flexion with tension on band     Shoulder Exercises: Prone   Extension Strengthening;Left;10 reps;Weights  hold 5 sec; bring scapula down and back, 2#   Extension Weight (lbs) 2    Horizontal ABduction 1 Strengthening;Left;10 reps;Limitations   Other Prone Exercises Prone Row x20 with 2# hold 5 sec and not twitching  therapist preventing patient from popping her shoulder     Shoulder Exercises: Standing   Extension Strengthening;Both;20 reps  15#   Extension Limitations VC to contract abdominals and posture   Retraction Strengthening;Both;10 reps  20#   Retraction Limitations upright posture; retraction of scapula     Shoulder Exercises: ROM/Strengthening   UBE (Upper Arm Bike) Level 1 x 6 (3/3) minutes forward  verbal cues for posture, tactile cues for Lt scapula   Other ROM/Strengthening Exercises shoulder flexion with UE ranger (bottom at 14) 30x ; shoulder abduction 30x, then bring arm side to side like washing a wall 20x     Modalities   Modalities Electrical Stimulation;Moist Heat     Moist Heat Therapy   Number Minutes Moist Heat 15 Minutes   Moist Heat Location Cervical;Shoulder     Electrical Stimulation   Electrical Stimulation Location Lt shoulder/upper trap   Electrical Stimulation Action IFC   Electrical Stimulation Parameters 15 minutes   Electrical Stimulation Goals Pain                PT Education - 08/04/16 1131  Education provided No          PT Short Term Goals - 07/26/16 0954      PT SHORT TERM GOAL #2   Title pain in left shoulder decreased >/= 25% due to decreased in muscle spasms and twitching   Time 4   Period Weeks   Status On-going  no change     PT SHORT TERM GOAL #3   Title popping noise in left shoulder decreased >/= 25% due to decreased involuntary movement   Time 4   Period Weeks   Status On-going  during therapy was reduction from initial evaluation with frequency     PT SHORT TERM GOAL #4   Title pain with sleeping decreased >/= 25% due to understanding ways to sleep to decrease strain on left shoulder   Time 4   Period Weeks   Status On-going           PT Long Term Goals - 08/04/16  1109      PT LONG TERM GOAL #1   Title independent with HEP   Time 8   Period Weeks   Status On-going     PT LONG TERM GOAL #2   Title reduce pain in left shoulder with daily activities >/= 75%   Time 8   Period Weeks   Status On-going  decreased by 10%     PT LONG TERM GOAL #3   Title reduction of popping noise in left shoulder reduced >/= 75% due to reduction of trigger points and twitching   Time 8   Period Weeks   Status On-going  20% better     PT LONG TERM GOAL #4   Title ability to fall asleep and stay asleep with >/= 75% greater ease due to decreased pain   Time 8   Period Weeks   Status On-going     PT LONG TERM GOAL #5   Title left shoulder AROM for flexion and abduction  increases by 20 degrees due to strength increases >/= 4+/5   Time 8   Period Weeks   Status On-going     PT LONG TERM GOAL #6   Title reach behind her back with left arm with pain decreased >/= 50% and increased shoulder mobility to dress herself with greater ease   Time 8   Period Weeks   Status On-going  20% better               Plan - 08/05/16 0929    Clinical Impression Statement Pt with 20% reduction in pain overall.  Pt with improved Lt shoulder A/ROM as measured yesterday.  Pt requires constant verbal cueing not to pop her shoulder and tactile cues for scapular retraction with exercises.  Pt will continue to benefit from skilled PT for scapular/postural strength, flexiblity and modalities PRN.     Rehab Potential Good   Clinical Impairments Affecting Rehab Potential involuntary twitch of left shoulder   PT Frequency 2x / week   PT Duration 8 weeks   PT Treatment/Interventions Cryotherapy;Electrical Stimulation;Iontophoresis 53m/ml Dexamethasone;Moist Heat;Ultrasound;Patient/family education;Neuromuscular re-education;Therapeutic exercise;Therapeutic activities;Manual techniques;Dry needling;Taping;Passive range of motion   PT Next Visit Plan Continue gentle scapular strength,  work internal and external rotation; end with electrical stimulation   Consulted and Agree with Plan of Care Patient      Patient will benefit from skilled therapeutic intervention in order to improve the following deficits and impairments:  Increased fascial restricitons, Increased muscle spasms, Decreased endurance, Decreased activity tolerance,  Pain, Decreased mobility, Decreased strength  Visit Diagnosis: Chronic left shoulder pain  Muscle weakness (generalized)  Stiffness of left shoulder, not elsewhere classified     Problem List Patient Active Problem List   Diagnosis Date Noted  . Nonallopathic lesion of thoracic region 06/30/2016  . Nonallopathic lesion of rib cage 06/30/2016  . Nonallopathic lesion of cervical region 06/30/2016  . Scapular dyskinesis 04/29/2016  . Subacromial bursitis 04/29/2016  . Substance abuse in remission 03/28/2015  . Headache(784.0) 12/14/2013  . OCP (oral contraceptive pills) initiation 08/15/2013  . Febrile respiratory illness 02/13/2013  . Smoker 02/13/2013  . Depression 12/05/2012  . Tobacco user 08/19/2012  . Exercise-induced asthma 04/03/2011    Sigurd Sos, PT 08/05/16 10:07 AM PHYSICAL THERAPY DISCHARGE SUMMARY  Visits from Start of Care: 9  Current functional level related to goals / functional outcomes: Pt didn't return to PT.  See above for most recent status.     Remaining deficits: See above   Education / Equipment: HEP, posture Plan: Patient agrees to discharge.  Patient goals were partially met. Patient is being discharged due to not returning since the last visit.  ?????        Sigurd Sos, PT 09/23/16 8:34 AM    [facility] [address], [place], [place], [address] Phone: [phone]   Fax:  [phone]  Name: [place] MRN: [id number] Date of Birth: 1996-04-10

## 2016-08-11 NOTE — Progress Notes (Signed)
[facility] [address], [place] [address] Phone: [phone] Subjective:    I'm seeing this patient by the request  of:  Lottie Dawson, MD   CC: shoulder pain,  Left f/u  [id number]  Candace Shaw is a 21 y.o. female coming in with complaint of left shoulder pain.patient states that she has been having involuntary movement of her left shoulder.Patient was seen previously and did have what appeared to be more of a scapular dyskinesia and mild subacromial bursitis. Patient and failed all other conservative therapy. We attempted start her on Effexor at a low dose. Patient was to monitor closely for any side effects. Patient's states she has continued with formal physical therapy. Patient states PT is helping.   Patient though is accompanied with mother and states that unfortunately she is having some worsening symptoms that may be depression. Patient was started Effexor. Does not know what is secondary to medications or other things that are occurring. Patient is seen her psychiatrist later today. Patient denies any suicidal or homicidal ideation but patient did do cutting the other night.     Past Medical History:  Diagnosis Date  . Asthma   . Bipolar disorder ([place])   . Depression   . Hallucinogen dependence, unspecified 10/03/2012  . Headache(784.0)   . Obesity   . Personality disorder    Past Surgical History:  Procedure Laterality Date  . WISDOM TOOTH EXTRACTION  Age 6   Social History   Social History  . Marital status: Single    Spouse name: N/A  . Number of children: N/A  . Years of education: N/A   Occupational History  . [employer]     Social History Main Topics  . Smoking status: Current Every Day Smoker    Packs/day: 0.50    Years: 4.00    Types: Cigarettes  . Smokeless tobacco: Never Used  . 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 Drug use: No   Comment: Acid, mushrooms, DNT  . Sexual activity: Not Currently    Partners: Female    Birth control/ protection: Condom, OCP, Pill   Other Topics Concern  . None   Social History Narrative   Legal Guardians: Herbie [place] and [place] of 3   Cat   [place]   Lives with mom    Allergies  Allergen Reactions  . Zolpidem Tartrate Other (See Comments)    Hallucinations   Family History  Problem Relation Age of Onset  . Adopted: Yes  . Alcohol abuse Mother   . Alcohol abuse Father     Past medical history, social, surgical and family history all reviewed in electronic medical record.  No pertanent information unless stated regarding to the chief complaint.   Review of Systems: No headache, visual changes, nausea, vomiting, diarrhea, constipation, dizziness, abdominal pain, skin rash, fevers, chills, night sweats, weight loss, swollen lymph nodes,  chest pain, shortness of breath,     Objective  Blood pressure 110/74, pulse 93, height 5' (1.524 m), weight 151 lb (68.5 kg), SpO2 96 %.  Systems examined below as of 08/12/16 General: NAD A&O x3 mood, affect normal  HEENT: Pupils equal, extraocular movements intact no nystagmus Respiratory: not short of breath at rest or with speaking Cardiovascular: No lower extremity edema, non tender Skin: Warm dry intact with  no signs of infection or rash on extremities or on axial skeleton. Abdomen: Soft nontender, no masses Neuro: Cranial nerves  intact, neurovascularly intact in all extremities with 2+ DTRs and 2+ pulses. Lymph: No lymphadenopathy appreciated today  Gait normal with good balance and coordination.  MSK: Non tender with full range of motion and good stability and symmetric strength and tone of  elbows, wrist,  knee hips and ankles bilaterally.    Shoulder: left Inspection reveals no abnormalities, atrophy or asymmetry. Still moderately discomfort in the upper left shoulder ROM is  full in all planes passively. Rotator cuff strength normal throughout. Speeds and Yergason's tests normal. No labral pathology noted with negative Obrien's, negative clunk and good stability. Severe scapular dyskinesis still noted. Patient does have what appears to be somewhat involuntary movements of the shoulder blade still present, maybe less then before.  No painful arc and no drop arm sign. No apprehension sign Mild improvement in strength. Contralateral shoulder unremarkable    Impression and Recommendations:     This case required medical decision making of moderate complexity.      Note: This dictation was prepared with Dragon dictation along with smaller phrase technology. Any transcriptional errors that result from this process are unintentional.

## 2016-08-12 ENCOUNTER — Ambulatory Visit (INDEPENDENT_AMBULATORY_CARE_PROVIDER_SITE_OTHER): Payer: BC Managed Care – PPO | Admitting: Family Medicine

## 2016-08-12 ENCOUNTER — Encounter: Payer: Self-pay | Admitting: Family Medicine

## 2016-08-12 DIAGNOSIS — G2589 Other specified extrapyramidal and movement disorders: Secondary | ICD-10-CM

## 2016-08-12 DIAGNOSIS — F3341 Major depressive disorder, recurrent, in partial remission: Secondary | ICD-10-CM

## 2016-08-12 NOTE — Assessment & Plan Note (Signed)
Discussed with patient at great length. I do believe the patient has been some progress and is making some improvement. We discussed with patient about icing regimen, posture, ergonomics. Because patient has had difficult he with depression on the Effexor could be helping but also could be causing more pressure. We'll time the psychiatrist. We did discuss that if any suicidal or homicidal ideation than patient should seek medical attention immediately. Patient is to write me weekly with updates and we'll see her again in 4 weeks.

## 2016-08-12 NOTE — Assessment & Plan Note (Signed)
Once again discussed with patient at great length. We discussed home exercises, we discussed though that we need to monitor this. Patient was accompanied with mother and we'll watch for any signs and symptoms. We discussed the potential of bringing her off the medication which patient declined. Patient will call the psychiatrist decide what would be the better opportunity. Weekly updates every week and then we'll see patient again in 4 weeks.

## 2016-08-12 NOTE — Patient Instructions (Signed)
Great to see you  Hope all is well and improving.  Lets not make any changes Keep active TENS unit could be great 1-2 times a day  See me again in 4 weeks.

## 2016-08-16 ENCOUNTER — Ambulatory Visit (INDEPENDENT_AMBULATORY_CARE_PROVIDER_SITE_OTHER): Payer: BC Managed Care – PPO | Admitting: Family Medicine

## 2016-08-16 VITALS — BP 100/78 | HR 133 | Temp 98.2°F | Ht 60.0 in | Wt 148.0 lb

## 2016-08-16 DIAGNOSIS — B2790 Infectious mononucleosis, unspecified without complication: Secondary | ICD-10-CM | POA: Diagnosis not present

## 2016-08-16 DIAGNOSIS — J029 Acute pharyngitis, unspecified: Secondary | ICD-10-CM | POA: Diagnosis not present

## 2016-08-16 LAB — POCT MONO (EPSTEIN BARR VIRUS): Mono, POC: POSITIVE — AB

## 2016-08-16 LAB — POCT RAPID STREP A (OFFICE): Rapid Strep A Screen: NEGATIVE

## 2016-08-16 NOTE — Patient Instructions (Signed)
Infectious Mononucleosis  Infectious mononucleosis is a viral infection. It is often referred to as "mono." It causes symptoms that affect various areas of the body, including the throat, upper air passages, and lymph glands. The liver or spleen may also be affected.  The virus spreads from person to person (is contagious) through close contact. The illness is usually not serious, and it typically goes away in 2-4 weeks without treatment. In rare cases, symptoms can be more severe and last longer, sometimes up to several months.  What are the causes?  This condition is commonly caused by the Epstein-Barr virus. This virus spreads through:   Contact with an infected person's saliva or other bodily fluids, often through:  ? Kissing.  ? Sexual contact.  ? Coughing.  ? Sneezing.   Sharing utensils or drinking glasses that were recently used by an infected person.   Blood transfusions.   Organ transplantation.    What increases the risk?  You are more likely to develop this condition if:   You are 15-24 years old.    What are the signs or symptoms?  Symptoms of this condition usually appear 4-6 weeks after infection. Symptoms may develop slowly and occur at different times. Common symptoms include:   Sore throat.   Headache.   Extreme fatigue.   Muscle aches.   Swollen glands.   Fever.   Poor appetite.   Rash.    Other symptoms include:   Enlarged liver or spleen.   Nausea.   Abdominal pain.    How is this diagnosed?  This condition may be diagnosed based on:   Your medical history.   Your symptoms.   A physical exam.   Blood tests to confirm the diagnosis.    How is this treated?  There is no cure for this condition. Infectious mononucleosis usually goes away on its own with time. Treatment can help relieve symptoms and may include:   Taking medicines to relieve pain and fever.   Drinking plenty of fluids.   Getting a lot of rest.   Medicine (corticosteroids)to reduce swelling. This may be used  if swelling in the throat causes breathing or swallowing problems.    In some severe cases, treatment has to be given in a hospital.  Follow these instructions at home:  Medicines   Take over-the-counter and prescription medicines only as told by your health care provider.   Do not take ampicillin or amoxicillin. This may cause a rash.   If you are under 18, do not take aspirin because of the association with Reye syndrome.  Activity   Rest as needed.   Do not participate in any of the following activities until your health care provider approves:  ? Contact sports. You may need to wait at least a month before participating in sports.  ? Exercise that requires a lot of energy.  ? Heavy lifting.   Gradually resume your normal activities after your fever is gone, or when your health care provider tells you that you can. Be sure to rest when you get tired.  General instructions   Avoid kissing or sharing utensils or drinking glasses until your health care provider tells you that you are no longer contagious.   Drink enough fluid to keep your urine clear or pale yellow.   Do not drink alcohol.   If you have a sore throat:  ? Gargle with a salt-water mixture 3-4 times a day or as needed. To make a salt-water mixture,   completely dissolve -1 tsp of salt in 1 cup of warm water.  ? Eat soft foods. Cold foods such as ice cream or frozen ice pops can soothe a sore throat.  ? Try sucking on hard candy.   Wash your hands often with soap and water to avoid spreading the infection. If soap and water are not available, use hand sanitizer.  How is this prevented?   Avoid contact with people who are infected with mononucleosis. An infected person may not always appear ill, but he or she can still spread the virus.   Avoid sharing utensils, drinking glasses, or toothbrushes.   Wash your hands frequently with soap and water. If soap and water are not available, use hand sanitizer.   Use the inside of your elbow to cover  your mouth when coughing or sneezing.  Contact a health care provider if:   Your fever is not gone after 10 days.   You have swollen lymph nodes that are not back to normal after 4 weeks.   Your activity level is not back to normal after 2 months.   Your skin or the white parts of your eyes turn yellow (jaundice).   You have constipation. This may mean that you have:  ? Fewer bowel movements in a week than normal.  ? Difficulty having a bowel movement.  ? Stools that are dry, hard, or larger than normal.  Get help right away if:   You have severe pain in your abdomen or shoulder.   You are drooling.   You have trouble swallowing.   You have trouble breathing.   You develop a stiff neck.   You develop a severe headache.   You cannot stop vomiting.   You have jerky movements that you cannot control (seizures).   You are confused.   You have trouble with balance.   Your nose or gums begin to bleed.   You have signs of dehydration. These may include:  ? Weakness.  ? Sunken eyes.  ? Pale skin.  ? Dry mouth.  ? Rapid breathing or pulse.  Summary   Infectious mononucleosis, or "mono," is an infection caused by the Epstein-Barr virus.   The virus that causes this condition is spread through bodily fluids. The virus is most commonly spread by kissing or sharing drinks or utensils with an infected person.   You are more likely to develop this infection if you are 15-24 years old.   Symptoms of this condition can include sore throat, headache, fever, swollen glands, muscle aches, extreme fatigue, and swollen liver or spleen.   There is no cure for this condition. The goal of treatment is to help relieve symptoms. Treatment may include drinking plenty of water, getting a lot of rest, and taking pain relievers.  This information is not intended to replace advice given to you by your health care provider. Make sure you discuss any questions you have with your health care provider.  Document Released:  07/23/2000 Document Revised: 04/13/2016 Document Reviewed: 04/13/2016  Elsevier Interactive Patient Education  2017 [employer]

## 2016-08-16 NOTE — Progress Notes (Signed)
Subjective:     Patient ID: Candace Shaw, female   DOB: [birth year], 21 y.o.   MRN: [id number]  HPI Patient relates three-week history of "swollen tonsils ". She has noted some fatigue but had recent change of medication and wonders if that may be related (just started Effexor). She has had occasional sweats at night but no definite fever. She had noticed some redness and exudate of her tonsils last couple weeks. No sick contacts. Mild pain. Denies any nausea or vomiting. No cough. No postnasal drip symptoms. No skin rash.  No past hx of mono.  Past Medical History:  Diagnosis Date  . Asthma   . Bipolar disorder ([place])   . Depression   . Hallucinogen dependence, unspecified 10/03/2012  . Headache(784.0)   . Obesity   . Personality disorder    Past Surgical History:  Procedure Laterality Date  . WISDOM TOOTH EXTRACTION  Age 69    reports that she has been smoking Cigarettes.  She has a 2.00 pack-year smoking history. She has never used smokeless tobacco. She reports that she does not drink alcohol or use drugs. family history includes Alcohol abuse in her father and mother. She was adopted. Allergies  Allergen Reactions  . Zolpidem Tartrate Other (See Comments)    Hallucinations     Review of Systems  Constitutional: Positive for fatigue. Negative for chills and fever.  HENT: Positive for sore throat. Negative for trouble swallowing.   Respiratory: Negative for cough.   Gastrointestinal: Negative for abdominal pain, nausea and vomiting.  Hematological: Negative for adenopathy.       Objective:   Physical Exam  Constitutional: She appears well-developed and well-nourished.  HENT:  Right Ear: External ear normal.  Left Ear: External ear normal.  Symmetrically enlarged tonsils with some yellowish exudate bilaterally. Mild erythema  Eyes: No scleral icterus.  Neck: Neck supple.  Cardiovascular: Normal rate and regular rhythm.   Pulmonary/Chest: Effort normal and breath  sounds normal. No respiratory distress. She has no wheezes. She has no rales.  Abdominal: Soft. Bowel sounds are normal. She exhibits no distension and no mass. There is no tenderness. There is no rebound and no guarding.  No hepatomegaly or splenomegaly noted  Lymphadenopathy:    She has no cervical adenopathy.       Assessment:     Acute exudative tonsillitis.  Rapid strep negative and monospot positive.    Plan:     -Check rapid strep. If negative, consider Monospot (+ as above) -rapid strep negative. -avoid any contact sports for at least one month -plenty of rest and symptomatic treatment. -she does not have any complicating features such as high fever, organomegaly, jaundice,vomiting, etc.  Eulas Post MD [facility]

## 2016-08-16 NOTE — Progress Notes (Signed)
Pre visit review using our clinic review tool, if applicable. No additional management support is needed unless otherwise documented below in the visit note. 

## 2016-08-23 ENCOUNTER — Encounter (HOSPITAL_COMMUNITY): Payer: Self-pay | Admitting: Psychiatry

## 2016-08-23 ENCOUNTER — Other Ambulatory Visit (HOSPITAL_COMMUNITY): Payer: BC Managed Care – PPO | Attending: Psychiatry | Admitting: Psychiatry

## 2016-08-23 DIAGNOSIS — Z79899 Other long term (current) drug therapy: Secondary | ICD-10-CM | POA: Insufficient documentation

## 2016-08-23 DIAGNOSIS — F17210 Nicotine dependence, cigarettes, uncomplicated: Secondary | ICD-10-CM | POA: Insufficient documentation

## 2016-08-23 DIAGNOSIS — F338 Other recurrent depressive disorders: Secondary | ICD-10-CM | POA: Insufficient documentation

## 2016-08-23 DIAGNOSIS — R45851 Suicidal ideations: Secondary | ICD-10-CM | POA: Diagnosis not present

## 2016-08-23 DIAGNOSIS — F320 Major depressive disorder, single episode, mild: Secondary | ICD-10-CM

## 2016-08-23 DIAGNOSIS — F339 Major depressive disorder, recurrent, unspecified: Secondary | ICD-10-CM | POA: Diagnosis present
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
## 2016-08-24 ENCOUNTER — Encounter (HOSPITAL_COMMUNITY): Payer: Self-pay | Admitting: Psychiatry

## 2016-08-24 ENCOUNTER — Other Ambulatory Visit (HOSPITAL_COMMUNITY): Payer: BC Managed Care – PPO | Admitting: Psychiatry

## 2016-08-24 DIAGNOSIS — F338 Other recurrent depressive disorders: Secondary | ICD-10-CM | POA: Diagnosis not present

## 2016-08-24 DIAGNOSIS — F320 Major depressive disorder, single episode, mild: Secondary | ICD-10-CM
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
## 2016-08-25 ENCOUNTER — Other Ambulatory Visit (HOSPITAL_COMMUNITY): Payer: BC Managed Care – PPO

## 2016-08-26 ENCOUNTER — Other Ambulatory Visit (HOSPITAL_COMMUNITY): Payer: BC Managed Care – PPO

## 2016-08-27 ENCOUNTER — Other Ambulatory Visit (HOSPITAL_COMMUNITY): Payer: BC Managed Care – PPO | Admitting: Psychiatry

## 2016-08-27 DIAGNOSIS — F320 Major depressive disorder, single episode, mild: Secondary | ICD-10-CM

## 2016-08-27 DIAGNOSIS — F338 Other recurrent depressive disorders: Secondary | ICD-10-CM | POA: Diagnosis not present
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
## 2016-08-30 ENCOUNTER — Other Ambulatory Visit (HOSPITAL_COMMUNITY): Payer: BC Managed Care – PPO | Admitting: Psychiatry

## 2016-08-30 DIAGNOSIS — F338 Other recurrent depressive disorders: Secondary | ICD-10-CM | POA: Diagnosis not present

## 2016-08-30 DIAGNOSIS — F320 Major depressive disorder, single episode, mild: Secondary | ICD-10-CM
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
## 2016-08-31 ENCOUNTER — Other Ambulatory Visit (HOSPITAL_COMMUNITY): Payer: BC Managed Care – PPO

## 2016-09-01 ENCOUNTER — Other Ambulatory Visit (HOSPITAL_COMMUNITY): Payer: BC Managed Care – PPO | Admitting: Psychiatry

## 2016-09-01 DIAGNOSIS — F338 Other recurrent depressive disorders: Secondary | ICD-10-CM | POA: Diagnosis not present

## 2016-09-01 DIAGNOSIS — F320 Major depressive disorder, single episode, mild: Secondary | ICD-10-CM
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
## 2016-09-02 ENCOUNTER — Other Ambulatory Visit (HOSPITAL_COMMUNITY): Payer: BC Managed Care – PPO | Admitting: Psychiatry

## 2016-09-02 DIAGNOSIS — F320 Major depressive disorder, single episode, mild: Secondary | ICD-10-CM

## 2016-09-02 DIAGNOSIS — F338 Other recurrent depressive disorders: Secondary | ICD-10-CM | POA: Diagnosis not present

## 2016-09-02 NOTE — Progress Notes (Signed)
    Daily Group Progress Note  Program: IOP  Group Time: 9:00-12:00  Participation Level: Active  Behavioral Response: Appropriate  Type of Therapy:  Group Therapy  Summary of Progress: Pt. Presents as alert, observant of group process. Pt. Shared pattern of behavior of allowing her personal boundaries to be crossed in relationships especially with men. Pt. Discussed feelings of guilt related to behaviors of others, especially men. Pt. Was able to process with the group her irrational thinking regarding owning the problematic behavior of others and taking responsibility over her own behavior in relationships that she can control. Pt. Participated in breathing exercise to lower stress. Pt. Also participated in discussion about changing our relationships to our feelings toward a more nonjudgmental stance.    Nancie Neas, LPC

## 2016-09-03 ENCOUNTER — Other Ambulatory Visit (HOSPITAL_COMMUNITY): Payer: BC Managed Care – PPO

## 2016-09-06 ENCOUNTER — Other Ambulatory Visit (HOSPITAL_COMMUNITY): Payer: BC Managed Care – PPO | Admitting: Psychiatry

## 2016-09-06 DIAGNOSIS — F320 Major depressive disorder, single episode, mild: Secondary | ICD-10-CM

## 2016-09-06 DIAGNOSIS — F338 Other recurrent depressive disorders: Secondary | ICD-10-CM | POA: Diagnosis not present

## 2016-09-07 ENCOUNTER — Other Ambulatory Visit (HOSPITAL_COMMUNITY): Payer: BC Managed Care – PPO | Admitting: Psychiatry
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
## 2016-09-08 ENCOUNTER — Telehealth (HOSPITAL_COMMUNITY): Payer: Self-pay | Admitting: Psychiatry

## 2016-09-08 ENCOUNTER — Other Ambulatory Visit (HOSPITAL_COMMUNITY): Payer: BC Managed Care – PPO | Admitting: Psychiatry

## 2016-09-08 NOTE — Telephone Encounter (Signed)
D:  Pt didn't show for MH-IOP yesterday or today, nor did she call.  Placed call to pt; vm was full; but pt returned writer's call.  According to pt, she had an appt with Pauline Good, NP today and has to work starting at 42 am.  Pt also is requesting discharge.  "I have my job, the court issue going on, I've quit seeing Ivin Booty (therapist), it's just too much going on."  A:  Encouraged pt to come in tomorrow for a formal discharge.  R:  Pt receptive.

## 2016-09-09 ENCOUNTER — Other Ambulatory Visit (HOSPITAL_COMMUNITY): Payer: BC Managed Care – PPO | Attending: Psychiatry | Admitting: Psychiatry

## 2016-09-09 DIAGNOSIS — F339 Major depressive disorder, recurrent, unspecified: Secondary | ICD-10-CM | POA: Diagnosis present

## 2016-09-09 DIAGNOSIS — F320 Major depressive disorder, single episode, mild: Secondary | ICD-10-CM

## 2016-09-09 DIAGNOSIS — F332 Major depressive disorder, recurrent severe without psychotic features: Secondary | ICD-10-CM | POA: Diagnosis not present
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
## 2016-09-09 NOTE — Patient Instructions (Signed)
D:  Pt successfully completed MH-IOP today.  Will follow up with Bascom Levels, LCAS (pt will call her for an appointment).  Also, will f/u with Pauline Good, NP.  Encouraged support groups and possibly DBT.

## 2016-09-09 NOTE — Progress Notes (Signed)
[facility] DISCHARGE NOTE  Patient:  Candace Shaw DOB:  1996/05/19  Date of Admission: 08/23/2016  Date of Discharge: 09/09/2016  Reason for Admission:depression  IOP Course:erratic attendance but contributed and benefitted when present.  Less depressed, no suicidal thoughts or cutting.  Requesting discharge due to so much that she has to do each day.  Mental Status at Discharge:no suicidal thoughts  Diagnosis: major depression, recurrent severe without psychosis  Level of Care:  IOP  Discharge destination: has appointments with therapist and provider     Comments:  none  The patient received suicide prevention pamphlet:  Yes   Donnelly Angelica, MD Patient ID: [name], female   DOB: 1996-02-20, 21 y.o.   MRN: [id number]

## 2016-09-10 ENCOUNTER — Other Ambulatory Visit (HOSPITAL_COMMUNITY): Payer: BC Managed Care – PPO

## 2016-09-13 ENCOUNTER — Other Ambulatory Visit (HOSPITAL_COMMUNITY): Payer: BC Managed Care – PPO
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
## 2016-09-14 ENCOUNTER — Other Ambulatory Visit (HOSPITAL_COMMUNITY): Payer: BC Managed Care – PPO

## 2016-09-15 ENCOUNTER — Ambulatory Visit: Payer: BC Managed Care – PPO | Admitting: Family Medicine

## 2016-09-16 ENCOUNTER — Other Ambulatory Visit (HOSPITAL_COMMUNITY): Payer: BC Managed Care – PPO

## 2016-09-17 ENCOUNTER — Other Ambulatory Visit (HOSPITAL_COMMUNITY): Payer: BC Managed Care – PPO

## 2016-09-20 ENCOUNTER — Other Ambulatory Visit (HOSPITAL_COMMUNITY): Payer: BC Managed Care – PPO

## 2016-09-20 NOTE — Progress Notes (Signed)
    Daily Group Progress Note  Program: IOP  Group Time: 9:00-12:00   Participation Level:  active   Behavioral Response:  engaged   Type of Therapy:   group therapy  Summary of Progress: Today was C's discharge date.  C appeared to listen intently to the group process around relationships, medications, and feeling hopeful.  When it came towards ending time for group, the group shared feelings and memories about C throughout her treatment.  C was tearful and present with the groups' kind words around her input, insight, kindness, and overall contribution to the group.  C has plans to join a DBT group and continue with AA.  Nancie Neas, LPC

## 2016-09-21 ENCOUNTER — Other Ambulatory Visit (HOSPITAL_COMMUNITY): Payer: BC Managed Care – PPO

## 2016-09-22 ENCOUNTER — Other Ambulatory Visit (HOSPITAL_COMMUNITY): Payer: BC Managed Care – PPO

## 2016-09-23 ENCOUNTER — Other Ambulatory Visit (HOSPITAL_COMMUNITY): Payer: BC Managed Care – PPO

## 2016-09-24 ENCOUNTER — Other Ambulatory Visit (HOSPITAL_COMMUNITY): Payer: BC Managed Care – PPO

## 2016-09-27 ENCOUNTER — Other Ambulatory Visit (HOSPITAL_COMMUNITY): Payer: BC Managed Care – PPO

## 2016-09-28 ENCOUNTER — Other Ambulatory Visit (HOSPITAL_COMMUNITY): Payer: BC Managed Care – PPO

## 2016-09-29 ENCOUNTER — Other Ambulatory Visit (HOSPITAL_COMMUNITY): Payer: BC Managed Care – PPO

## 2016-09-30 ENCOUNTER — Other Ambulatory Visit (HOSPITAL_COMMUNITY): Payer: BC Managed Care – PPO

## 2016-10-01 ENCOUNTER — Other Ambulatory Visit (HOSPITAL_COMMUNITY): Payer: BC Managed Care – PPO

## 2016-10-04 ENCOUNTER — Other Ambulatory Visit (HOSPITAL_COMMUNITY): Payer: BC Managed Care – PPO

## 2016-10-05 ENCOUNTER — Other Ambulatory Visit (HOSPITAL_COMMUNITY): Payer: BC Managed Care – PPO

## 2016-10-06 ENCOUNTER — Other Ambulatory Visit (HOSPITAL_COMMUNITY): Payer: BC Managed Care – PPO

## 2016-10-07 ENCOUNTER — Other Ambulatory Visit (HOSPITAL_COMMUNITY): Payer: BC Managed Care – PPO

## 2016-10-08 ENCOUNTER — Other Ambulatory Visit (HOSPITAL_COMMUNITY): Payer: BC Managed Care – PPO

## 2016-10-11 ENCOUNTER — Other Ambulatory Visit (HOSPITAL_COMMUNITY): Payer: BC Managed Care – PPO

## 2016-10-12 ENCOUNTER — Other Ambulatory Visit (HOSPITAL_COMMUNITY): Payer: BC Managed Care – PPO

## 2016-10-13 ENCOUNTER — Ambulatory Visit (INDEPENDENT_AMBULATORY_CARE_PROVIDER_SITE_OTHER): Payer: BC Managed Care – PPO | Admitting: Adult Health

## 2016-10-13 ENCOUNTER — Other Ambulatory Visit (HOSPITAL_COMMUNITY): Payer: BC Managed Care – PPO

## 2016-10-13 ENCOUNTER — Encounter: Payer: Self-pay | Admitting: Adult Health

## 2016-10-13 VITALS — BP 100/70 | Temp 98.4°F | Wt 151.0 lb

## 2016-10-13 DIAGNOSIS — L923 Foreign body granuloma of the skin and subcutaneous tissue: Secondary | ICD-10-CM | POA: Diagnosis not present

## 2016-10-13 MED ORDER — DOXYCYCLINE HYCLATE 100 MG PO CAPS: 100.0000 mg | ORAL_CAPSULE | Freq: Two times a day (BID) | ORAL | 0 refills | Status: DC

## 2016-10-13 NOTE — Progress Notes (Signed)
Subjective:    Patient ID: Candace Shaw, female    DOB: 1996-04-23, 21 y.o.   MRN: [id number]  HPI  21 year old female who presents to the office today for possible infection in a tattoo she received 10 days ago on left arm. She has noticed pain around tattoo site, localized redness and crusting over portions of tattoo.   Denies any fevers or red streaking. Has had trace clear drainage from tattoo.   Tattoo was received from a reputable tattoo shop   Review of Systems See HPI   Past Medical History:  Diagnosis Date  . Asthma   . Bipolar disorder ([place])   . Depression   . Hallucinogen dependence, unspecified 10/03/2012  . Headache(784.0)   . Obesity   . Personality disorder     Social History   Social History  . Marital status: Single    Spouse name: N/A  . Number of children: N/A  . Years of education: N/A   Occupational History  . [employer] shop     Social History Main Topics  . Smoking status: Current Every Day Smoker    Packs/day: 0.50    Years: 4.00    Types: Cigarettes  . Smokeless tobacco: Never Used  . 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 Drug use: No     Comment: Acid, mushrooms, DNT  . Sexual activity: Not Currently    Partners: Female    Birth control/ protection: Condom, OCP, Pill   Other Topics Concern  . Not on file   Social History Narrative   Legal Guardians: [place] and [facility]   [employer] [facility] [place]   Lives with mom     Past Surgical History:  Procedure Laterality Date  . WISDOM TOOTH EXTRACTION  Age 11    Family History  Problem Relation Age of Onset  . Adopted: Yes  . Alcohol abuse Mother   . Alcohol abuse Father     Allergies  Allergen Reactions  . Zolpidem Tartrate Other (See Comments)    Hallucinations    Current Outpatient Prescriptions on File Prior to Visit  Medication Sig Dispense Refill  . albuterol  (PROVENTIL HFA;VENTOLIN HFA) 108 (90 BASE) MCG/ACT inhaler Inhale 2 puffs into the lungs every 6 (six) hours as needed. For shortness of breath. 1 Inhaler 2  . gabapentin (NEURONTIN) 300 MG capsule Take 300 mg by mouth 3 (three) times daily. As Needed.    Marland Kitchen ibuprofen (ADVIL,MOTRIN) 200 MG tablet Take 600 mg by mouth every 6 (six) hours as needed for moderate pain.    Marland Kitchen levonorgestrel (MIRENA) 20 MCG/24HR IUD 1 each by Intrauterine route once.    . venlafaxine XR (EFFEXOR XR) 37.5 MG 24 hr capsule Take 1 capsule (37.5 mg total) by mouth daily with breakfast. 30 capsule 1   No current facility-administered medications on file prior to visit.     BP 100/70 (BP Location: Right Arm, Patient Position: Sitting, Cuff Size: Normal)   Temp 98.4 F (36.9 C) (Oral)   Wt 151 lb (68.5 kg)   BMI 29.49 kg/m       Objective:   Physical Exam  Constitutional: She is oriented to person, place, and time. She appears well-developed and well-nourished. No distress.  Cardiovascular: Normal rate, regular rhythm, normal heart sounds and intact distal pulses.  Exam  reveals no gallop and no friction rub.   No murmur heard. Pulmonary/Chest: Effort normal and breath sounds normal. No respiratory distress. She has no wheezes. She has no rales. She exhibits no tenderness.  Neurological: She is alert and oriented to person, place, and time.  Skin: Skin is warm and dry. No rash noted. She is not diaphoretic. No erythema. No pallor.  Scabbing noted. Localized warmth and redness present. No red streaking. No drainage.   Psychiatric: She has a normal mood and affect. Her behavior is normal. Judgment and thought content normal.  Nursing note and vitals reviewed.     Assessment & Plan:  1. Tattoo reaction - doxycycline (VIBRAMYCIN) 100 MG capsule; Take 1 capsule (100 mg total) by mouth 2 (two) times daily.  Dispense: 20 capsule; Refill: 0 - Can use A&D ointment topically.  - Follow up with worsening symptoms or not  complete resolution   Dorothyann Peng, NP

## 2016-10-13 NOTE — Progress Notes (Signed)
Pre visit review using our clinic review tool, if applicable. No additional management support is needed unless otherwise documented below in the visit note. 

## 2016-10-14 ENCOUNTER — Other Ambulatory Visit (HOSPITAL_COMMUNITY): Payer: BC Managed Care – PPO

## 2016-10-15 ENCOUNTER — Other Ambulatory Visit (HOSPITAL_COMMUNITY): Payer: BC Managed Care – PPO

## 2016-12-04 ENCOUNTER — Emergency Department (HOSPITAL_COMMUNITY)
Admission: EM | Admit: 2016-12-04 | Discharge: 2016-12-05 | Disposition: A | Discharge status: Home / Self Care | Payer: BC Managed Care – PPO | Attending: Internal Medicine | Admitting: Internal Medicine

## 2016-12-04 ENCOUNTER — Encounter (HOSPITAL_COMMUNITY): Payer: Self-pay | Admitting: Emergency Medicine

## 2016-12-04 ENCOUNTER — Ambulatory Visit (HOSPITAL_COMMUNITY)
Admission: RE | Admit: 2016-12-04 | Discharge: 2016-12-04 | Disposition: A | Discharge status: Home / Self Care | Payer: BC Managed Care – PPO | Attending: Psychiatry | Admitting: Psychiatry

## 2016-12-04 DIAGNOSIS — Z79899 Other long term (current) drug therapy: Secondary | ICD-10-CM | POA: Insufficient documentation

## 2016-12-04 DIAGNOSIS — Z915 Personal history of self-harm: Secondary | ICD-10-CM | POA: Insufficient documentation

## 2016-12-04 DIAGNOSIS — J45909 Unspecified asthma, uncomplicated: Secondary | ICD-10-CM | POA: Diagnosis not present

## 2016-12-04 DIAGNOSIS — F17210 Nicotine dependence, cigarettes, uncomplicated: Secondary | ICD-10-CM | POA: Diagnosis not present

## 2016-12-04 DIAGNOSIS — F319 Bipolar disorder, unspecified: Secondary | ICD-10-CM | POA: Insufficient documentation

## 2016-12-04 DIAGNOSIS — Z888 Allergy status to other drugs, medicaments and biological substances status: Secondary | ICD-10-CM | POA: Insufficient documentation

## 2016-12-04 DIAGNOSIS — F32A Depression, unspecified: Secondary | ICD-10-CM | POA: Diagnosis present

## 2016-12-04 DIAGNOSIS — F329 Major depressive disorder, single episode, unspecified: Secondary | ICD-10-CM | POA: Insufficient documentation

## 2016-12-04 DIAGNOSIS — R45851 Suicidal ideations: Secondary | ICD-10-CM | POA: Diagnosis present

## 2016-12-04 DIAGNOSIS — Z811 Family history of alcohol abuse and dependence: Secondary | ICD-10-CM | POA: Insufficient documentation

## 2016-12-04 LAB — COMPREHENSIVE METABOLIC PANEL
ALT: 12 U/L — AB (ref 14–54)
AST: 16 U/L (ref 15–41)
Albumin: 4.5 g/dL (ref 3.5–5.0)
Alkaline Phosphatase: 48 U/L (ref 38–126)
Anion gap: 9 (ref 5–15)
BILIRUBIN TOTAL: 0.7 mg/dL (ref 0.3–1.2)
BUN: 14 mg/dL (ref 6–20)
CO2: 23 mmol/L (ref 22–32)
CREATININE: 0.58 mg/dL (ref 0.44–1.00)
Calcium: 9.3 mg/dL (ref 8.9–10.3)
Chloride: 106 mmol/L (ref 101–111)
GFR calc Af Amer: >60 mL/min (ref >60)
Glucose, Bld: 87 mg/dL (ref 65–99)
Potassium: 3.4 mmol/L — ABNORMAL LOW (ref 3.5–5.1)
Sodium: 138 mmol/L (ref 135–145)
TOTAL PROTEIN: 7.7 g/dL (ref 6.5–8.1)

## 2016-12-04 LAB — CBC
HCT: 40.1 % (ref 36.0–46.0)
Hemoglobin: 14.1 g/dL (ref 12.0–15.0)
MCH: 30.5 pg (ref 26.0–34.0)
MCHC: 35.2 g/dL (ref 30.0–36.0)
MCV: 86.8 fL (ref 78.0–100.0)
PLATELETS: 298 10*3/uL (ref 150–400)
RBC: 4.62 MIL/uL (ref 3.87–5.11)
RDW: 11.8 % (ref 11.5–15.5)
WBC: 7.3 10*3/uL (ref 4.0–10.5)

## 2016-12-04 LAB — ETHANOL

## 2016-12-04 LAB — SALICYLATE LEVEL: Salicylate Lvl: <7 mg/dL (ref 2.8–30.0)

## 2016-12-04 LAB — RAPID URINE DRUG SCREEN, HOSP PERFORMED
Amphetamines: NOT DETECTED
Barbiturates: NOT DETECTED
Benzodiazepines: NOT DETECTED
Cocaine: NOT DETECTED
Opiates: NOT DETECTED
Tetrahydrocannabinol: NOT DETECTED

## 2016-12-04 LAB — ACETAMINOPHEN LEVEL: Acetaminophen (Tylenol), Serum: <10 ug/mL — ABNORMAL LOW (ref 10–30)

## 2016-12-04 MED ORDER — LAMOTRIGINE 25 MG PO TABS
25.0000 mg | ORAL_TABLET | Freq: Every day | ORAL | Status: DC
  Administered 2016-12-04 – 2016-12-05 (×2): 25 mg via ORAL
  Filled 2016-12-04 (×2): qty 1

## 2016-12-04 MED ORDER — VENLAFAXINE HCL ER 37.5 MG PO CP24
37.5000 mg | ORAL_CAPSULE | Freq: Every day | ORAL | Status: DC
  Filled 2016-12-04: qty 1

## 2016-12-04 MED ORDER — ALBUTEROL SULFATE HFA 108 (90 BASE) MCG/ACT IN AERS: 2.0000 | INHALATION_SPRAY | Freq: Four times a day (QID) | RESPIRATORY_TRACT | Status: DC | PRN

## 2016-12-04 MED ORDER — IBUPROFEN 200 MG PO TABS: 600.0000 mg | ORAL_TABLET | Freq: Four times a day (QID) | ORAL | Status: DC | PRN

## 2016-12-04 MED ORDER — GABAPENTIN 300 MG PO CAPS: 300.0000 mg | ORAL_CAPSULE | Freq: Three times a day (TID) | ORAL | Status: DC

## 2016-12-04 MED ORDER — LEVONORGESTREL 20 MCG/24HR IU IUD: 1.0000 | INTRAUTERINE_SYSTEM | Freq: Once | INTRAUTERINE | Status: DC

## 2016-12-04 MED ORDER — DOXYCYCLINE HYCLATE 100 MG PO CAPS: 100.0000 mg | ORAL_CAPSULE | Freq: Two times a day (BID) | ORAL | Status: DC

## 2016-12-04 NOTE — H&P (Signed)
Behavioral Health Medical Screening Exam  Candace Shaw is an 21 y.o. female.  Total Time spent with patient: 15 minutes  Psychiatric Specialty Exam: Physical Exam  Constitutional: She is oriented to person, place, and time. She appears well-developed and well-nourished. No distress.  HENT:  Head: Normocephalic and atraumatic.  Right Ear: External ear normal.  Left Ear: External ear normal.  Eyes: Pupils are equal, round, and reactive to light. Right eye exhibits no discharge. Left eye exhibits discharge. No scleral icterus.  Neck: Normal range of motion.  Cardiovascular: Normal rate, regular rhythm and normal heart sounds.   Respiratory: Effort normal and breath sounds normal. No respiratory distress.  Musculoskeletal: Normal range of motion.  Neurological: She is alert and oriented to person, place, and time.  Skin: Skin is warm and dry. She is not diaphoretic.  Psychiatric: Her speech is normal. Her mood appears anxious. Her affect is blunt. She is withdrawn. She is not actively hallucinating. Thought content is not paranoid and not delusional. Cognition and memory are normal. She expresses impulsivity and inappropriate judgment. She exhibits a depressed mood. She expresses suicidal ideation. She expresses no homicidal ideation. She expresses suicidal plans. She is attentive.    Review of Systems  Psychiatric/Behavioral: Positive for depression and suicidal ideas. Negative for hallucinations, memory loss and substance abuse. The patient is nervous/anxious and has insomnia.   All other systems reviewed and are negative.   Blood pressure 107/64, pulse 81, temperature 98.4 F (36.9 C), temperature source Oral, resp. rate 18, SpO2 99 %.There is no height or weight on file to calculate BMI.  General Appearance: Casual and Fairly Groomed  Eye Contact:  Fair  Speech:  Clear and Coherent and Normal Rate  Volume:  Normal  Mood:  Anxious, Depressed, Dysphoric, Hopeless, Irritable and  Worthless  Affect:  Blunt and Congruent  Thought Process:  Coherent and Goal Directed  Orientation:  Full (Time, Place, and Person)  Thought Content:  Logical, Hallucinations: None and Rumination  Suicidal Thoughts:  Yes.  with intent/plan  Homicidal Thoughts:  No  Memory:  Immediate;   Good Recent;   Good Remote;   Good  Judgement:  Impaired  Insight:  Lacking  Psychomotor Activity:  Normal  Concentration: Concentration: Fair and Attention Span: Fair  Recall:  Good  Fund of Knowledge:Good  Language: Good  Akathisia:  No  Handed:  Right  AIMS (if indicated):     Assets:  Communication Skills Desire for Improvement Housing Physical Health Social Support  Sleep:       Musculoskeletal: Strength & Muscle Tone: within normal limits Gait & Station: normal   Blood pressure 107/64, pulse 81, temperature 98.4 F (36.9 C), temperature source Oral, resp. rate 18, SpO2 99 %.  Recommendations:  Based on my evaluation the patient does not appear to have an emergency medical condition. Due to patient's history of two suicide attempts and current thoughts of suicide, recommend inpatient.   5:05 AM- patient is unwilling to sign in voluntarily. Discussed inpatient, obs unit, and transfer to [facility]. Patient refuses all options. Contacted Dr. Parke Poisson who will evaluate. AC and TTS is aware of plan.   Rozetta Nunnery, NP 12/04/2016, 4:19 AM

## 2016-12-04 NOTE — ED Notes (Signed)
Patient pleasant and cooperative with care. Pt currently denies suicidal ideations or thoughts of cutting herself. Pt with multiple visitors throughout the shift including her parents. Family appears to be supportive and are noted to be appropriate throughout the visit.

## 2016-12-04 NOTE — ED Triage Notes (Signed)
Patient IVC'd while at behavioral health.  Came in by sheriff after having suicidal thoughts with a plan of cutting herself.  She came to behavioral health at 3AM this morning and now says she is no longer suicidal.  Patient reports she has bipolar and restarted her lamictal 3 days ago.  Patient reports she is an alcoholic but is currently in remission.

## 2016-12-04 NOTE — ED Provider Notes (Signed)
[facility] DEPT Provider Note   CSN: [id number] Arrival date & time: 12/04/16  1113     History   Chief Complaint Chief Complaint  Patient presents with  . Suicidal    HPI Candace Shaw is a 21 y.o. female.  Patient with hx depression, presents from [facility] with increased depression in past week.  Pt states several factors, but does not expound upon them. Symptoms moderate/persistent. States on lamictal for 3 days, no change since then. Remote hx etoh abuse, indicates off etoh x many months. Denies current substance abuse issues.  Earlier thoughts of suicide, currently denies. States physical health at baseline, no recent illness. Was evaluated and BH earlier and sent to ED.   The history is provided by the patient.    Past Medical History:  Diagnosis Date  . Asthma   . Bipolar disorder ([place])   . Depression   . Hallucinogen dependence, unspecified 10/03/2012  . Headache(784.0)   . Obesity   . Personality disorder     Patient Active Problem List   Diagnosis Date Noted  . Nonallopathic lesion of thoracic region 06/30/2016  . Nonallopathic lesion of rib cage 06/30/2016  . Nonallopathic lesion of cervical region 06/30/2016  . Scapular dyskinesis 04/29/2016  . Subacromial bursitis 04/29/2016  . Substance abuse in remission 03/28/2015  . Headache(784.0) 12/14/2013  . OCP (oral contraceptive pills) initiation 08/15/2013  . Febrile respiratory illness 02/13/2013  . Smoker 02/13/2013  . Depression 12/05/2012  . Tobacco user 08/19/2012  . Exercise-induced asthma 04/03/2011    Past Surgical History:  Procedure Laterality Date  . WISDOM TOOTH EXTRACTION  Age 53    OB History    No data available       Home Medications    Prior to Admission medications   Medication Sig Start Date End Date Taking? Authorizing Provider  albuterol (PROVENTIL HFA;VENTOLIN HFA) 108 (90 BASE) MCG/ACT inhaler Inhale 2 puffs into the lungs every 6 (six) hours as needed. For shortness  of breath. 02/13/13   Burnis Medin, MD  doxycycline (VIBRAMYCIN) 100 MG capsule Take 1 capsule (100 mg total) by mouth 2 (two) times daily. 10/13/16   Dorothyann Peng, NP  gabapentin (NEURONTIN) 300 MG capsule Take 300 mg by mouth 3 (three) times daily. As Needed.    Historical Provider, MD  ibuprofen (ADVIL,MOTRIN) 200 MG tablet Take 600 mg by mouth every 6 (six) hours as needed for moderate pain.    Historical Provider, MD  levonorgestrel (MIRENA) 20 MCG/24HR IUD 1 each by Intrauterine route once.    Historical Provider, MD  venlafaxine XR (EFFEXOR XR) 37.5 MG 24 hr capsule Take 1 capsule (37.5 mg total) by mouth daily with breakfast. 07/22/16   Lyndal Pulley, DO    Family History Family History  Problem Relation Age of Onset  . Adopted: Yes  . Alcohol abuse Mother   . Alcohol abuse Father     Social History Social History  Substance Use Topics  . Smoking status: Current Every Day Smoker    Packs/day: 0.50    Years: 4.00    Types: Cigarettes  . Smokeless tobacco: Never Used  . Alcohol use No     Comment: used to drink heavily; relapsed in march 2017; sober since     Allergies   Zolpidem tartrate   Review of Systems Review of Systems  Constitutional: Negative for fever.  HENT: Negative for sore throat.   Eyes: Negative for redness.  Respiratory: Negative for shortness of breath.  Cardiovascular: Negative for chest pain.  Gastrointestinal: Negative for abdominal pain.  Genitourinary: Negative for flank pain.  Musculoskeletal: Negative for back pain.  Skin: Negative for rash.  Neurological: Negative for headaches.  Hematological: Does not bruise/bleed easily.  Psychiatric/Behavioral: Positive for dysphoric mood. The patient is nervous/anxious.      Physical Exam Updated Vital Signs BP 103/63 (BP Location: Right Arm)   Pulse 66   Temp 98.2 F (36.8 C) (Oral)   Resp 20   LMP  (LMP Unknown) Comment: Patient reports she has an IUD and doesn't have periods  SpO2 97%    Physical Exam  Constitutional: She appears well-developed and well-nourished. No distress.  HENT:  Head: Atraumatic.  Eyes: Conjunctivae are normal. No scleral icterus.  Neck: Neck supple. No tracheal deviation present.  Cardiovascular: Normal rate.   Pulmonary/Chest: Effort normal. No respiratory distress.  Abdominal: Normal appearance.  Musculoskeletal: She exhibits no edema.  Neurological: She is alert.  Skin: Skin is warm and dry. No rash noted. She is not diaphoretic.  Psychiatric:  Flat affect. Depressed mood. Denies SI.  Nursing note and vitals reviewed.    ED Treatments / Results  Labs (all labs ordered are listed, but only abnormal results are displayed) Results for orders placed or performed during the hospital encounter of 12/04/16  Comprehensive metabolic panel  Result Value Ref Range   Sodium 138 135 - 145 mmol/L   Potassium 3.4 (L) 3.5 - 5.1 mmol/L   Chloride 106 101 - 111 mmol/L   CO2 23 22 - 32 mmol/L   Glucose, Bld 87 65 - 99 mg/dL   BUN 14 6 - 20 mg/dL   Creatinine, Ser 0.58 0.44 - 1.00 mg/dL   Calcium 9.3 8.9 - 10.3 mg/dL   Total Protein 7.7 6.5 - 8.1 g/dL   Albumin 4.5 3.5 - 5.0 g/dL   AST 16 15 - 41 U/L   ALT 12 (L) 14 - 54 U/L   Alkaline Phosphatase 48 38 - 126 U/L   Total Bilirubin 0.7 0.3 - 1.2 mg/dL   GFR calc non Af Amer >60 >60 mL/min   GFR calc Af Amer >60 >60 mL/min   Anion gap 9 5 - 15  Ethanol  Result Value Ref Range   Alcohol, Ethyl (B) <5 <5 mg/dL  Salicylate level  Result Value Ref Range   Salicylate Lvl <0.3 2.8 - 30.0 mg/dL  Acetaminophen level  Result Value Ref Range   Acetaminophen (Tylenol), Serum <10 (L) 10 - 30 ug/mL  cbc  Result Value Ref Range   WBC 7.3 4.0 - 10.5 K/uL   RBC 4.62 3.87 - 5.11 MIL/uL   Hemoglobin 14.1 12.0 - 15.0 g/dL   HCT 40.1 36.0 - 46.0 %   MCV 86.8 78.0 - 100.0 fL   MCH 30.5 26.0 - 34.0 pg   MCHC 35.2 30.0 - 36.0 g/dL   RDW 11.8 11.5 - 15.5 %   Platelets 298 150 - 400 K/uL  Rapid urine drug  screen (hospital performed)  Result Value Ref Range   Opiates NONE DETECTED NONE DETECTED   Cocaine NONE DETECTED NONE DETECTED   Benzodiazepines NONE DETECTED NONE DETECTED   Amphetamines NONE DETECTED NONE DETECTED   Tetrahydrocannabinol NONE DETECTED NONE DETECTED   Barbiturates NONE DETECTED NONE DETECTED   EKG  EKG Interpretation None       Radiology No results found.  Procedures Procedures (including critical care time)  Medications Ordered in ED Medications - No data to display  Initial Impression / Assessment and Plan / ED Course  I have reviewed the triage vital signs and the nursing notes.  Pertinent labs & imaging results that were available during my care of the patient were reviewed by me and considered in my medical decision making (see chart for details).  [place] team consulted.  Wallace holding ordered placed.  Disposition per [facility] team.     Final Clinical Impressions(s) / ED Diagnoses   Final diagnoses:  None    New Prescriptions New Prescriptions   No medications on file     Lajean Saver, MD 12/04/16 1339

## 2016-12-04 NOTE — ED Notes (Signed)
Bed: WA27 Expected date:  Expected time:  Means of arrival:  Comments: Hold
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
## 2016-12-05 MED ORDER — GABAPENTIN 300 MG PO CAPS: 300.0000 mg | ORAL_CAPSULE | Freq: Three times a day (TID) | ORAL | 0 refills | Status: DC

## 2016-12-05 MED ORDER — LAMOTRIGINE 25 MG PO TABS: 25.0000 mg | ORAL_TABLET | Freq: Every day | ORAL | 1 refills | Status: DC

## 2016-12-05 MED ORDER — LURASIDONE HCL 20 MG PO TABS: 20.0000 mg | ORAL_TABLET | Freq: Every day | ORAL | 0 refills | Status: DC

## 2016-12-05 NOTE — Progress Notes (Signed)
Patient is accepted at [facility], to Dr. Alonza Smoker, call report at [phone].  Bed is ready now. Please fax IVC papers to fax#:[phone]. [facility] RN Verna Czech has been informed.  Verlon Setting, [place] Disposition staff 12/05/2016 11:14 AM

## 2016-12-05 NOTE — Progress Notes (Signed)
CSW contacted and informed [facility] that patient is no longer requiring placement, staff thanked CSW for information.   Abundio Miu, [facility]  Clinical Social Worker [phone]
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
## 2016-12-05 NOTE — Progress Notes (Signed)
Referral submitted to Cristal Ford, Karolee Ohs, [place], Sharlene Motts, [place], [place], [facility], [place], [place], [place], [place]. Nash Shearer, LPC-A, [facility]  Counselor 12/05/2016 5:17 AM

## 2017-04-22 ENCOUNTER — Encounter: Payer: Self-pay | Admitting: Internal Medicine

## 2017-09-27 NOTE — Progress Notes (Signed)
Chief Complaint  Patient presents with  . Weight Check    weight gain x3 months, no changes to diet/ meds, Pt started to exrecise 1 month ago, increased stress levels    HPI: Candace Shaw 22 y.o. last seen  Over  1 year ago   Sent in by  Pauline Good prescriber and self about  Rapid weight gain  In past months . Was on gabapentin  upt to 900 per day  I past 3-4 months  Has gained 30 pounds in 3 months . Just stopped the gaba   .  Sleep more  Tired  At least nine    Hours  Cant tell if from bipolar meds or  Other causes    Was doing middle of night late snacking   Felt from the gabapentin  iud no periods .  Live with roommate  .     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    No other  New meds  No osa   ROS: See pertinent positives and negatives per HPI. No cv pulm sx  Has iud for periods  Adopted  No fam hx except known to heve  Past Medical History:  Diagnosis Date  . Asthma   . Bipolar disorder ([place])   . Depression   . Hallucinogen dependence, unspecified 10/03/2012  . Headache(784.0)   . Obesity   . Personality disorder ([place])     Family History  Adopted: Yes  Problem Relation Age of Onset  . Alcohol abuse Mother   . Alcohol abuse Father     Social History   Socioeconomic History  . Marital status: Single    Spouse name: None  . Number of children: None  . Years of education: None  . Highest education level: None  Social Needs  . Financial resource strain: None  . Food insecurity - worry: None  . Food insecurity - inability: None  . Transportation needs - medical: None  . Transportation needs - non-medical: None  Occupational History  . Occupation: coffee shop   Tobacco Use  . Smoking status: Former Smoker    Packs/day: 0.50    Years: 4.00    Pack years: 2.00    Types: Cigarettes  . Smokeless tobacco: Never Used  Substance and Sexual Activity    . 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 Drug use: No    Comment: Acid, mushrooms, DNT  . Sexual activity: Not Currently    Partners: Female    Birth control/protection: Condom, OCP, Pill  Other Topics Concern  . 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 albuterol (PROVENTIL HFA;VENTOLIN HFA) 108 (90 BASE) MCG/ACT inhaler Inhale 2 puffs into the lungs every 6 (six) hours as needed. For shortness of breath. 1 Inhaler 2  . gabapentin (NEURONTIN) 300 MG capsule Take 1 capsule (300 mg total) by mouth 3 (three) times daily. 42 capsule 0  . ibuprofen (ADVIL,MOTRIN)  200 MG tablet Take 600 mg by mouth every 6 (six) hours as needed for moderate pain.    Marland Kitchen lamoTRIgine (LAMICTAL) 25 MG tablet Take 25 mg by mouth daily.     Marland Kitchen lamoTRIgine (LAMICTAL) 25 MG tablet Take 1 tablet (25 mg total) by mouth daily. 14 tablet 1  . levonorgestrel (MIRENA) 20 MCG/24HR IUD 1 each by Intrauterine route once.    . lurasidone (LATUDA) 20 MG TABS tablet Take 1 tablet (20 mg total) by mouth daily. 14 tablet 0  . venlafaxine XR (EFFEXOR XR) 37.5 MG 24 hr capsule Take 1 capsule (37.5 mg total) by mouth daily with breakfast. (Patient not taking: Reported on 12/04/2016) 30 capsule 1  . doxycycline (VIBRAMYCIN) 100 MG capsule Take 1 capsule (100 mg total) by mouth 2 (two) times daily. (Patient not taking: Reported on 12/04/2016) 20 capsule 0  . gabapentin (NEURONTIN) 300 MG capsule Take 300 mg by mouth 3 (three) times daily. As Needed.     No facility-administered medications prior to visit.      EXAM:  BP 108/78 (BP Location: Right Arm, Patient Position: Sitting, Cuff Size: Normal)   Pulse 81   Temp 97.9 F (36.6 C) (Oral)   Wt  178 lb 3.2 oz (80.8 kg)   BMI 34.80 kg/m   Body mass index is 34.8 kg/m.  GENERAL: vitals reviewed and listed above, alert, oriented, appears well hydrated and in no acute distress HEENT: atraumatic, conjunctiva  clear, no obvious abnormalities on inspection of external nose and ears OP : no lesion edema or exudate  NECK: no obvious masses on inspection palpation  LUNGS: clear to auscultation bilaterally, no wheezes, rales or rhonchi, good air movement CV: HRRR, no clubbing cyanosis or  peripheral edema nl cap refill  Abdomen:  Sof,t normal bowel sounds without hepatosplenomegaly, no guarding rebound or masses no CVA tenderness MS: moves all extremities without noticeable focal  abnormality PSYCH: pleasant and cooperative, no obvious depression or anxiety Lab Results  Component Value Date   WBC 7.3 12/04/2016   HGB 14.1 12/04/2016   HCT 40.1 12/04/2016   PLT 298 12/04/2016   GLUCOSE 87 12/04/2016   ALT 12 (L) 12/04/2016   AST 16 12/04/2016   NA 138 12/04/2016   K 3.4 (L) 12/04/2016   CL 106 12/04/2016   CREATININE 0.58 12/04/2016   BUN 14 12/04/2016   CO2 23 12/04/2016   TSH 2.61 08/11/2011   BP Readings from Last 3 Encounters:  09/29/17 108/78  12/05/16 104/62  10/13/16 100/70   Wt Readings from Last 3 Encounters:  09/29/17 178 lb 3.2 oz (80.8 kg)  10/13/16 151 lb (68.5 kg)  08/16/16 148 lb (67.1 kg)    ASSESSMENT AND PLAN:  Discussed the following assessment and plan:  Weight gain - Plan: TSH, T4, free, CMP, CBC with Differential/Platelet, Lipid panel, Hemoglobin A1c, Cortisol-am, blood  Family history of diabetes mellitus (DM) - Plan: TSH, T4, free, CMP, CBC with Differential/Platelet, Lipid panel, Hemoglobin A1c, Cortisol-am, blood  Other fatigue - Plan: TSH, T4, free, CMP, CBC with Differential/Platelet, Lipid panel, Hemoglobin A1c, Cortisol-am, blood  Weight gain due to medication  ? possible  Poss  Med  ls related  But  R/o [metabolic  Am cortisol a bit  late  ( pt  awaknes after 9 on non work days )   If needed may repeat .  Suspect neurontin could contribute  With lat night snacking    R/o metabilic causes   reviewed intervention at  this time She will try to sing up for my chart  -Patient advised to return or notify health care team  if  new concerns arise.  Patient Instructions  Will notify you  of labs when available.  Cut out beverages with calories x  Mild. And eating lat at night .  As you plan to do this .     Standley Brooking. Rashaan Wyles M.D.

## 2017-09-29 ENCOUNTER — Ambulatory Visit (INDEPENDENT_AMBULATORY_CARE_PROVIDER_SITE_OTHER): Payer: BC Managed Care – PPO | Admitting: Internal Medicine

## 2017-09-29 ENCOUNTER — Encounter: Payer: Self-pay | Admitting: Internal Medicine

## 2017-09-29 VITALS — BP 108/78 | HR 81 | Temp 97.9°F | Wt 178.2 lb

## 2017-09-29 DIAGNOSIS — Z833 Family history of diabetes mellitus: Secondary | ICD-10-CM

## 2017-09-29 DIAGNOSIS — R635 Abnormal weight gain: Secondary | ICD-10-CM

## 2017-09-29 DIAGNOSIS — T50905A Adverse effect of unspecified drugs, medicaments and biological substances, initial encounter: Secondary | ICD-10-CM

## 2017-09-29 DIAGNOSIS — R5383 Other fatigue: Secondary | ICD-10-CM | POA: Diagnosis not present

## 2017-09-29 LAB — TSH: TSH: 3 u[IU]/mL (ref 0.35–4.50)

## 2017-09-29 LAB — CBC WITH DIFFERENTIAL/PLATELET
BASOS PCT: 0.4 % (ref 0.0–3.0)
Basophils Absolute: 0 10*3/uL (ref 0.0–0.1)
EOS PCT: 2.3 % (ref 0.0–5.0)
Eosinophils Absolute: 0.2 10*3/uL (ref 0.0–0.7)
HEMATOCRIT: 41.1 % (ref 36.0–46.0)
HEMOGLOBIN: 14.4 g/dL (ref 12.0–15.0)
LYMPHS PCT: 27.4 % (ref 12.0–46.0)
Lymphs Abs: 2.1 10*3/uL (ref 0.7–4.0)
MCHC: 35 g/dL (ref 30.0–36.0)
MCV: 87.9 fl (ref 78.0–100.0)
Monocytes Absolute: 0.4 10*3/uL (ref 0.1–1.0)
Monocytes Relative: 5 % (ref 3.0–12.0)
Neutro Abs: 5.1 10*3/uL (ref 1.4–7.7)
Neutrophils Relative %: 64.9 % (ref 43.0–77.0)
Platelets: 287 10*3/uL (ref 150.0–400.0)
RBC: 4.68 Mil/uL (ref 3.87–5.11)
RDW: 11.7 % (ref 11.5–15.5)
WBC: 7.8 10*3/uL (ref 4.0–10.5)

## 2017-09-29 LAB — LIPID PANEL
CHOL/HDL RATIO: 4
Cholesterol: 147 mg/dL (ref 0–200)
HDL: 38 mg/dL — ABNORMAL LOW (ref >39.00)
LDL CALC: 93 mg/dL (ref 0–99)
NonHDL: 109.21
TRIGLYCERIDES: 82 mg/dL (ref 0.0–149.0)
VLDL: 16.4 mg/dL (ref 0.0–40.0)

## 2017-09-29 LAB — COMPREHENSIVE METABOLIC PANEL
ALBUMIN: 4.3 g/dL (ref 3.5–5.2)
ALK PHOS: 51 U/L (ref 39–117)
ALT: 20 U/L (ref 0–35)
AST: 17 U/L (ref 0–37)
BUN: 10 mg/dL (ref 6–23)
CALCIUM: 9.6 mg/dL (ref 8.4–10.5)
CHLORIDE: 104 meq/L (ref 96–112)
CO2: 27 mEq/L (ref 19–32)
Creatinine, Ser: 0.65 mg/dL (ref 0.40–1.20)
GFR: 119.08 mL/min (ref >60.00)
Glucose, Bld: 103 mg/dL — ABNORMAL HIGH (ref 70–99)
POTASSIUM: 4.2 meq/L (ref 3.5–5.1)
SODIUM: 137 meq/L (ref 135–145)
Total Bilirubin: 0.3 mg/dL (ref 0.2–1.2)
Total Protein: 7.3 g/dL (ref 6.0–8.3)

## 2017-09-29 LAB — HEMOGLOBIN A1C: HEMOGLOBIN A1C: 5.8 % (ref 4.6–6.5)

## 2017-09-29 LAB — T4, FREE: Free T4: 0.64 ng/dL (ref 0.60–1.60)

## 2017-09-29 NOTE — Patient Instructions (Addendum)
Will notify you  of labs when available.  Cut out beverages with calories x  Mild. And eating lat at night .  As you plan to do this .

## 2017-09-30 LAB — CORTISOL-AM, BLOOD: Cortisol - AM: 12.7 ug/dL

## 2017-11-17 ENCOUNTER — Ambulatory Visit (INDEPENDENT_AMBULATORY_CARE_PROVIDER_SITE_OTHER): Payer: BC Managed Care – PPO | Admitting: Internal Medicine

## 2017-11-17 ENCOUNTER — Encounter: Payer: Self-pay | Admitting: Internal Medicine

## 2017-11-17 VITALS — BP 118/72 | HR 117 | Temp 98.4°F | Wt 174.2 lb

## 2017-11-17 DIAGNOSIS — K529 Noninfective gastroenteritis and colitis, unspecified: Secondary | ICD-10-CM | POA: Diagnosis not present

## 2017-11-17 MED ORDER — ONDANSETRON HCL 4 MG PO TABS: 4.0000 mg | ORAL_TABLET | Freq: Three times a day (TID) | ORAL | 0 refills | Status: DC | PRN

## 2017-11-17 NOTE — Progress Notes (Signed)
Chief Complaint  Patient presents with  . Influenza    X 1 days, oatient ate [employer] for lunch and started feeling bad shortly after, diarrhea, vomiting last night, body aches, chills, cold sweats, fever last night, possible exposure,     HPI: Candace Shaw 22 y.o. sda  Fever last .  And then  Loose watery diarrhea  Without blood and vomiting able to tolerate water today  No one else  GI  Sick no cough  Rash .  Cramping abd pain but no abd pain or bleed   No menses cause on  iud .  ROS: See pertinent positives and negatives per HPI. No rash  No etoh.  No uti sx .   Past Medical History:  Diagnosis Date  . Asthma   . Bipolar disorder ([place])   . Depression   . Hallucinogen dependence, unspecified 10/03/2012  . Headache(784.0)   . Obesity   . Personality disorder ([place])     Family History  Adopted: Yes  Problem Relation Age of Onset  . Alcohol abuse Mother   . Alcohol abuse Father     Social History   Socioeconomic History  . Marital status: Single    Spouse name: Not on file  . Number of children: Not on file  . Years of education: Not on file  . Highest education level: Not on file  Occupational History  . Occupation: coffee shop   Social Needs  . Financial resource strain: Not on file  . Food insecurity:    Worry: Not on file    Inability: Not on file  . Transportation needs:    Medical: Not on file    Non-medical: Not on file  Tobacco Use  . Smoking status: Former Smoker    Packs/day: 0.50    Years: 4.00    Pack years: 2.00    Types: Cigarettes  . Smokeless tobacco: Never Used  Substance and Sexual Activity  . 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 Drug use: No    Comment: Acid, mushrooms, DNT  . Sexual activity: Not Currently    Partners: Female    Birth control/protection: Condom, OCP, Pill  Lifestyle  . Physical activity:    Days per week: Not on file    Minutes per session: Not on file    . Stress: Not on file  Relationships  . Social connections:    Talks on phone: Not on file    Gets together: Not on file    Attends religious service: Not on file    Active member of club or organization: Not on file    Attends meetings of clubs or organizations: Not on file    Relationship status: Not on file  Other Topics Concern  . 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 albuterol (PROVENTIL HFA;VENTOLIN HFA) 108 (90 BASE) MCG/ACT inhaler Inhale 2 puffs into the lungs every 6 (six) hours as needed. For shortness of breath. 1 Inhaler 2  . ibuprofen (ADVIL,MOTRIN) 200 MG tablet Take 600 mg by mouth every 6 (six) hours  as needed for moderate pain.    Marland Kitchen lamoTRIgine (LAMICTAL) 25 MG tablet Take 25 mg by mouth daily.     Marland Kitchen lamoTRIgine (LAMICTAL) 25 MG tablet Take 1 tablet (25 mg total) by mouth daily. 14 tablet 1  . levonorgestrel (MIRENA) 20 MCG/24HR IUD 1 each by Intrauterine route once.    . lurasidone (LATUDA) 20 MG TABS tablet Take 1 tablet (20 mg total) by mouth daily. 14 tablet 0  . venlafaxine XR (EFFEXOR XR) 37.5 MG 24 hr capsule Take 1 capsule (37.5 mg total) by mouth daily with breakfast. 30 capsule 1  . gabapentin (NEURONTIN) 300 MG capsule Take 1 capsule (300 mg total) by mouth 3 (three) times daily. (Patient not taking: Reported on 11/17/2017) 42 capsule 0   No facility-administered medications prior to visit.      EXAM:  BP 118/72 (BP Location: Left Arm, Patient Position: Sitting, Cuff Size: Normal)   Pulse (!) 117   Temp 98.4 F (36.9 C) (Oral)   Wt 174 lb 3.2 oz (79 kg)   BMI 34.02 kg/m   Body mass index is 34.02 kg/m.  GENERAL: vitals reviewed and listed above, alert, oriented, appears well hydrated and in no  acute distress sick non toxic  HEENT: atraumatic, conjunctiva  clear, no obvious abnormalities on inspection of external nose and ears tmx clear  OP : no lesion edema or exudate  Moist mucous membranes  NECK: no obvious masses on inspection palpation  LUNGS: clear to auscultation bilaterally, no wheezes, rales or rhonchi, good air movement Abdomen:  Sof,t normal bowel sounds without hepatosplenomegaly, no guarding rebound or masses no CVA tenderness  CV: HRRR, no clubbing cyanosis or  peripheral edema nl cap refill  MS: moves all extremities without noticeable focal  abnormality PSYCH: pleasant and cooperative, no obvious depression or anxiety  ASSESSMENT AND PLAN:  Discussed the following assessment and plan:  Gastroenteritis, infectious, presumed Has iud   No gu sx  .  Hydration adequate no alarm findings  -Patient advised to return or notify health care team  if symptoms worsen ,persist or new concerns arise.  Patient Instructions  This is a   Infectious gastroenteritis   And should get better in the next 48 hours but diarrhea can be an issue for a week or so but not as badly.  Med for nausea and vomiting  Push clear liquids for now to avoid dehydration   And then can advance   Bland solids as tolerated .   No work   Until fever gone and feeling better  No food preparation until diarrhea better   The  Illness is contagious .  Can try immodium if  Diarrhea is problematic  But nothing is also ok     Viral Gastroenteritis, Adult Viral gastroenteritis is also known as the stomach flu. This condition is caused by various viruses. These viruses can be passed from person to person very easily (are very contagious). This condition may affect your stomach, small intestine, and large intestine. It can cause sudden watery diarrhea, fever, and vomiting. Diarrhea and vomiting can make you feel weak and cause you to become dehydrated. You may not be able to keep fluids down. Dehydration can make  you tired and thirsty, cause you to have a dry mouth, and decrease how often you urinate. Older adults and people with other diseases or a weak immune system are at higher risk for dehydration. It is important to replace the fluids that you lose from diarrhea  and vomiting. If you become severely dehydrated, you may need to get fluids through an IV tube. What are the causes? Gastroenteritis is caused by various viruses, including rotavirus and norovirus. Norovirus is the most common cause in adults. You can get sick by eating food, drinking water, or touching a surface contaminated with one of these viruses. You can also get sick from sharing utensils or other personal items with an infected person. What increases the risk? This condition is more likely to develop in people:  Who have a weak defense system (immune system).  Who live with one or more children who are younger than 53 years old.  Who live in a nursing home.  Who go on cruise ships.  What are the signs or symptoms? Symptoms of this condition start suddenly 1-2 days after exposure to a virus. Symptoms may last a few days or as long as a week. The most common symptoms are watery diarrhea and vomiting. Other symptoms include:  Fever.  Headache.  Fatigue.  Pain in the abdomen.  Chills.  Weakness.  Nausea.  Muscle aches.  Loss of appetite.  How is this diagnosed? This condition is diagnosed with a medical history and physical exam. You may also have a stool test to check for viruses or other infections. How is this treated? This condition typically goes away on its own. The focus of treatment is to restore lost fluids (rehydration). Your health care provider may recommend that you take an oral rehydration solution (ORS) to replace important salts and minerals (electrolytes) in your body. Severe cases of this condition may require giving fluids through an IV tube. Treatment may also include medicine to help with your  symptoms. Follow these instructions at home: Follow instructions from your health care provider about how to care for yourself at home. Eating and drinking Follow these recommendations as told by your health care provider:  Take an ORS. This is a drink that is sold at pharmacies and retail stores.  Drink clear fluids in small amounts as you are able. Clear fluids include water, ice chips, diluted fruit juice, and low-calorie sports drinks.  Eat bland, easy-to-digest foods in small amounts as you are able. These foods include bananas, applesauce, rice, lean meats, toast, and crackers.  Avoid fluids that contain a lot of sugar or caffeine, such as energy drinks, sports drinks, and soda.  Avoid alcohol.  Avoid spicy or fatty foods.  General instructions   Drink enough fluid to keep your urine clear or pale yellow.  Wash your hands often. If soap and water are not available, use hand sanitizer.  Make sure that all people in your household wash their hands well and often.  Take over-the-counter and prescription medicines only as told by your health care provider.  Rest at home while you recover.  Watch your condition for any changes.  Take a warm bath to relieve any burning or pain from frequent diarrhea episodes.  Keep all follow-up visits as told by your health care provider. This is important. Contact a health care provider if:  You cannot keep fluids down.  Your symptoms get worse.  You have new symptoms.  You feel light-headed or dizzy.  You have muscle cramps. Get help right away if:  You have chest pain.  You feel extremely weak or you faint.  You see blood in your vomit.  Your vomit looks like coffee grounds.  You have bloody or black stools or stools that look like tar.  You  have a severe headache, a stiff neck, or both.  You have a rash.  You have severe pain, cramping, or bloating in your abdomen.  You have trouble breathing or you are breathing  very quickly.  Your heart is beating very quickly.  Your skin feels cold and clammy.  You feel confused.  You have pain when you urinate.  You have signs of dehydration, such as: ? Dark urine, very little urine, or no urine. ? Cracked lips. ? Dry mouth. ? Sunken eyes. ? Sleepiness. ? Weakness. This information is not intended to replace advice given to you by your health care provider. Make sure you discuss any questions you have with your health care provider. Document Released: 07/26/2005 Document Revised: 01/07/2016 Document Reviewed: 04/01/2015 Elsevier Interactive Patient Education  2018 [place] Madisan Bice M.D.

## 2017-11-17 NOTE — Patient Instructions (Addendum)
This is a   Infectious gastroenteritis   And should get better in the next 48 hours but diarrhea can be an issue for a week or so but not as badly.  Med for nausea and vomiting  Push clear liquids for now to avoid dehydration   And then can advance   Bland solids as tolerated .   No work   Until fever gone and feeling better  No food preparation until diarrhea better   The  Illness is contagious .  Can try immodium if  Diarrhea is problematic  But nothing is also ok     Viral Gastroenteritis, Adult Viral gastroenteritis is also known as the stomach flu. This condition is caused by various viruses. These viruses can be passed from person to person very easily (are very contagious). This condition may affect your stomach, small intestine, and large intestine. It can cause sudden watery diarrhea, fever, and vomiting. Diarrhea and vomiting can make you feel weak and cause you to become dehydrated. You may not be able to keep fluids down. Dehydration can make you tired and thirsty, cause you to have a dry mouth, and decrease how often you urinate. Older adults and people with other diseases or a weak immune system are at higher risk for dehydration. It is important to replace the fluids that you lose from diarrhea and vomiting. If you become severely dehydrated, you may need to get fluids through an IV tube. What are the causes? Gastroenteritis is caused by various viruses, including rotavirus and norovirus. Norovirus is the most common cause in adults. You can get sick by eating food, drinking water, or touching a surface contaminated with one of these viruses. You can also get sick from sharing utensils or other personal items with an infected person. What increases the risk? This condition is more likely to develop in people:  Who have a weak defense system (immune system).  Who live with one or more children who are younger than 55 years old.  Who live in a nursing home.  Who go on cruise  ships.  What are the signs or symptoms? Symptoms of this condition start suddenly 1-2 days after exposure to a virus. Symptoms may last a few days or as long as a week. The most common symptoms are watery diarrhea and vomiting. Other symptoms include:  Fever.  Headache.  Fatigue.  Pain in the abdomen.  Chills.  Weakness.  Nausea.  Muscle aches.  Loss of appetite.  How is this diagnosed? This condition is diagnosed with a medical history and physical exam. You may also have a stool test to check for viruses or other infections. How is this treated? This condition typically goes away on its own. The focus of treatment is to restore lost fluids (rehydration). Your health care provider may recommend that you take an oral rehydration solution (ORS) to replace important salts and minerals (electrolytes) in your body. Severe cases of this condition may require giving fluids through an IV tube. Treatment may also include medicine to help with your symptoms. Follow these instructions at home: Follow instructions from your health care provider about how to care for yourself at home. Eating and drinking Follow these recommendations as told by your health care provider:  Take an ORS. This is a drink that is sold at pharmacies and retail stores.  Drink clear fluids in small amounts as you are able. Clear fluids include water, ice chips, diluted fruit juice, and low-calorie sports drinks.  Eat  bland, easy-to-digest foods in small amounts as you are able. These foods include bananas, applesauce, rice, lean meats, toast, and crackers.  Avoid fluids that contain a lot of sugar or caffeine, such as energy drinks, sports drinks, and soda.  Avoid alcohol.  Avoid spicy or fatty foods.  General instructions   Drink enough fluid to keep your urine clear or pale yellow.  Wash your hands often. If soap and water are not available, use hand sanitizer.  Make sure that all people in your  household wash their hands well and often.  Take over-the-counter and prescription medicines only as told by your health care provider.  Rest at home while you recover.  Watch your condition for any changes.  Take a warm bath to relieve any burning or pain from frequent diarrhea episodes.  Keep all follow-up visits as told by your health care provider. This is important. Contact a health care provider if:  You cannot keep fluids down.  Your symptoms get worse.  You have new symptoms.  You feel light-headed or dizzy.  You have muscle cramps. Get help right away if:  You have chest pain.  You feel extremely weak or you faint.  You see blood in your vomit.  Your vomit looks like coffee grounds.  You have bloody or black stools or stools that look like tar.  You have a severe headache, a stiff neck, or both.  You have a rash.  You have severe pain, cramping, or bloating in your abdomen.  You have trouble breathing or you are breathing very quickly.  Your heart is beating very quickly.  Your skin feels cold and clammy.  You feel confused.  You have pain when you urinate.  You have signs of dehydration, such as: ? Dark urine, very little urine, or no urine. ? Cracked lips. ? Dry mouth. ? Sunken eyes. ? Sleepiness. ? Weakness. This information is not intended to replace advice given to you by your health care provider. Make sure you discuss any questions you have with your health care provider. Document Released: 07/26/2005 Document Revised: 01/07/2016 Document Reviewed: 04/01/2015 Elsevier Interactive Patient Education  [employer].  .

## 2018-02-16 ENCOUNTER — Ambulatory Visit (INDEPENDENT_AMBULATORY_CARE_PROVIDER_SITE_OTHER): Payer: BC Managed Care – PPO | Admitting: Sports Medicine

## 2018-02-16 ENCOUNTER — Encounter: Payer: Self-pay | Admitting: Sports Medicine

## 2018-02-16 ENCOUNTER — Ambulatory Visit: Payer: Self-pay

## 2018-02-16 ENCOUNTER — Encounter: Payer: Self-pay | Admitting: Internal Medicine

## 2018-02-16 ENCOUNTER — Ambulatory Visit (INDEPENDENT_AMBULATORY_CARE_PROVIDER_SITE_OTHER): Payer: BC Managed Care – PPO | Admitting: Internal Medicine

## 2018-02-16 VITALS — BP 104/72 | HR 88 | Ht 61.0 in | Wt 172.0 lb

## 2018-02-16 VITALS — BP 113/86 | HR 76 | Temp 97.8°F | Wt 172.0 lb

## 2018-02-16 DIAGNOSIS — L043 Acute lymphadenitis of lower limb: Secondary | ICD-10-CM

## 2018-02-16 DIAGNOSIS — R1909 Other intra-abdominal and pelvic swelling, mass and lump: Secondary | ICD-10-CM | POA: Diagnosis not present

## 2018-02-16 DIAGNOSIS — W5911XA Bitten by nonvenomous snake, initial encounter: Secondary | ICD-10-CM | POA: Insufficient documentation

## 2018-02-16 DIAGNOSIS — Z23 Encounter for immunization: Secondary | ICD-10-CM | POA: Diagnosis not present

## 2018-02-16 DIAGNOSIS — R1031 Right lower quadrant pain: Secondary | ICD-10-CM

## 2018-02-16 HISTORY — DX: Acute lymphadenitis of lower limb: L04.3

## 2018-02-16 HISTORY — DX: Bitten by nonvenomous snake, initial encounter: W59.11XA

## 2018-02-16 MED ORDER — FLUCONAZOLE 150 MG PO TABS: 150.0000 mg | ORAL_TABLET | Freq: Once | ORAL | 0 refills | Status: AC

## 2018-02-16 MED ORDER — IBUPROFEN 800 MG PO TABS: 800.0000 mg | ORAL_TABLET | Freq: Three times a day (TID) | ORAL | 1 refills | Status: DC | PRN

## 2018-02-16 MED ORDER — AMOXICILLIN-POT CLAVULANATE 875-125 MG PO TABS: 1.0000 | ORAL_TABLET | Freq: Two times a day (BID) | ORAL | 0 refills | Status: DC

## 2018-02-16 NOTE — Progress Notes (Signed)
Chief Complaint  Patient presents with  . Groin Pain    right    HPI: Candace Shaw 22 y.o.  SDA    Right groin pain  And felt lump last  Night  For 5 days .  Has stairs in new house and thought   A week ago   Not a lot of exercise x walking dogs  Woke up in pain .  About 5-6 days   Ago and then   Getting worse and then felt. Feels swollen    No injur but did move furniture last week  ROS: See pertinent positives and negatives per HPI. No fever  uit gyne sx  Systemic sx   Remote hx of  Mono no other nodules   Has kittens cats .   Past Medical History:  Diagnosis Date  . Asthma   . Bipolar disorder ([place])   . Depression   . Hallucinogen dependence, unspecified 10/03/2012  . Headache(784.0)   . Obesity   . Personality disorder ([place])     Family History  Adopted: Yes  Problem Relation Age of Onset  . Alcohol abuse Mother   . Alcohol abuse Father     Social History   Socioeconomic History  . Marital status: Single    Spouse name: Not on file  . Number of children: Not on file  . Years of education: Not on file  . Highest education level: Not on file  Occupational History  . Occupation: coffee shop   Social Needs  . Financial resource strain: Not on file  . Food insecurity:    Worry: Not on file    Inability: Not on file  . Transportation needs:    Medical: Not on file    Non-medical: Not on file  Tobacco Use  . Smoking status: Former Smoker    Packs/day: 0.50    Years: 4.00    Pack years: 2.00    Types: Cigarettes  . Smokeless tobacco: Never Used  Substance and Sexual Activity  . 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 Drug use: No    Comment: Acid, mushrooms, DNT  . Sexual activity: Not Currently    Partners: Female    Birth control/protection: Condom, OCP, Pill  Lifestyle  . Physical activity:    Days per week: Not on file    Minutes per session: Not on file  . Stress: Not on file    Relationships  . Social connections:    Talks on phone: Not on file    Gets together: Not on file    Attends religious service: Not on file    Active member of club or organization: Not on file    Attends meetings of clubs or organizations: Not on file    Relationship status: Not on file  Other Topics Concern  . 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 albuterol (PROVENTIL HFA;VENTOLIN HFA) 108 (90 BASE) MCG/ACT inhaler Inhale 2 puffs into the lungs every 6 (six) hours as needed. For shortness of breath. 1 Inhaler 2  . hydrOXYzine (VISTARIL) 25  MG capsule Take 25 mg by mouth 3 (three) times daily as needed.    Marland Kitchen ibuprofen (ADVIL,MOTRIN) 200 MG tablet Take 600 mg by mouth every 6 (six) hours as needed for moderate pain.    Marland Kitchen lamoTRIgine (LAMICTAL) 25 MG tablet Take 1 tablet (25 mg total) by mouth daily. (Patient taking differently: Take 50 mg by mouth daily. ) 14 tablet 1  . levonorgestrel (MIRENA) 20 MCG/24HR IUD 1 each by Intrauterine route once.    . lisdexamfetamine (VYVANSE) 20 MG capsule Take 20 mg by mouth daily.    Marland Kitchen lurasidone (LATUDA) 20 MG TABS tablet Take 1 tablet (20 mg total) by mouth daily. 14 tablet 0  . lamoTRIgine (LAMICTAL) 25 MG tablet Take 50 mg by mouth daily.     Marland Kitchen gabapentin (NEURONTIN) 300 MG capsule Take 1 capsule (300 mg total) by mouth 3 (three) times daily. (Patient not taking: Reported on 11/17/2017) 42 capsule 0  . ondansetron (ZOFRAN) 4 MG tablet Take 1 tablet (4 mg total) by mouth every 8 (eight) hours as needed for nausea or vomiting. (Patient not taking: Reported on 02/16/2018) 20 tablet 0  . venlafaxine XR (EFFEXOR XR) 37.5 MG 24 hr capsule Take 1 capsule (37.5 mg total) by mouth daily with breakfast. (Patient  not taking: Reported on 02/16/2018) 30 capsule 1   No facility-administered medications prior to visit.      EXAM:  BP 113/86   Pulse 76   Temp 97.8 F (36.6 C)   Wt 172 lb (78 kg)   BMI 33.59 kg/m   Body mass index is 33.59 kg/m.  GENERAL: vitals reviewed and listed above, alert, oriented, appears well hydrated and in no acute distress favors  Right leg  When walking  NECK: no obvious masses on inspection palpation  abd no masses   Ln no adenopathy neck and axillary   MS: moves all extremities right le room tender at inguinal ligament and feels full but not discrete mass   no redness   Healed spots on le   No active infection    rom hip slight pain with rotation     No change   Fullness with standing but favors leg . Right   PSYCH: pleasant and cooperative, no obvious depression or anxiety  ASSESSMENT AND PLAN:  Discussed the following assessment and plan:  Right groin pain - Plan: Ambulatory referral to Sports Medicine  Groin swelling - Plan: Ambulatory referral to Sports Medicine Groin pain radiation to leg    and swelling ? Cause   hernia other less likely rective ln?  Hip related ?    sm referral to assess help with dx and plan . No alarm sx otherwise ? If POC [place]  Would be helpful  -Patient advised to return or notify health care team  if symptoms worsen ,persist or new concerns arise.  Patient Instructions  consideration of hernia vs reactive lymph gland  Or  Groin area   Problem  Issues .   Plan       Standley Brooking. Makhari Dovidio M.D.

## 2018-02-16 NOTE — Procedures (Signed)
LIMITED MSK ULTRASOUND OF Right groin Images were obtained and interpreted by myself, Teresa Coombs, DO  Images have been saved and stored to PACS system. Images obtained on: GE S7 Ultrasound machine  FINDINGS:   Right inguinal region examined that shows swollen and hypervascular hypoechoic cystic masses consistent with acute lymphadenitis.  There is pain with sono palpation  Ultrasound of the gluteal region with overlying puncture wounds does not reveal any significant tissue disruption or any evidence of deep soft tissue fluid pocket.  IMPRESSION:  1. Right groin acute lymphadenitis 2. No significant soft tissue swelling or masses at the site of snakebite.

## 2018-02-16 NOTE — Progress Notes (Signed)
Candace Shaw. Delorise Shiner Sports [facility] at [facility] [phone]  Candace Shaw - 22 y.o. female MRN [id number]  Date of birth: [birth year]  Visit Date: 02/16/2018  PCP: Madelin Headings, MD   Referred by: Madelin Headings, MD  Scribe(s) for today's visit: Christoper Fabian, LAT, ATC  SUBJECTIVE:  Candace Shaw is here for New Patient (Initial Visit) (R groin pain) .  Referred by: Dr. Fabian Sharp  Her R groin pain symptoms INITIALLY: Began approximately 5 days ago w/ no known MOI Described as moderate aching pain, radiating to R anterior-medial thigh Worsened with walking, coughing, sneezing, ascending  stairs Improved with nothing noted Additional associated symptoms include: no R hip mechanical symptoms and no N/T into R LE.  Swelling noted in R groin lymph nodes    At this time symptoms are worsening compared to onset  She has been to see Dr. Fabian Sharp and was referred to Dr. Berline Chough.   Patient also reports that [place] snakebite was on the right posterior gluteal region.  Reports some minor bleeding but no significant symptoms at this time.     REVIEW OF SYSTEMS: Reports night time disturbances. Denies fevers, chills, or night sweats. Denies unexplained weight loss. Denies personal history of cancer. Denies changes in bowel or bladder habits. Denies recent unreported falls. Denies new or worsening dyspnea or wheezing. Reports headaches or dizziness. Yes to headaches. Denies numbness, tingling or weakness  In the extremities.  Denies dizziness or presyncopal episodes Denies lower extremity edema    HISTORY & PERTINENT PRIOR DATA:  Prior History reviewed and updated per electronic medical record.  Significant/pertinent history, findings, studies include:  reports that she has quit smoking. Her smoking use included cigarettes. She has a 2.00 pack-year smoking history. She has never  used smokeless tobacco. Recent Labs    09/29/17 1132  HGBA1C 5.8   No specialty comments available. Problem  Snake Bite  Acute Lymphadenitis of Lower Extremity    OBJECTIVE:  VS:  HT:5\' 1"  (154.9 cm)   WT:172 lb (78 kg)  BMI:32.52    BP:104/72  HR:88bpm  TEMP: ( )  RESP:97 %   PHYSICAL EXAM: Constitutional: WDWN, Non-toxic appearing. Psychiatric: Alert & appropriately interactive.  Not depressed or anxious appearing. Respiratory: No increased work of breathing.  Trachea Midline Eyes: Pupils are equal.  EOM intact without nystagmus.  No scleral icterus  Vascular Exam: warm to touch no edema  lower extremity neuro exam: unremarkable normal strength normal sensation  MSK Exam: Right hip and groin are overall well aligned she has painful lymph nodes in the right groin, none present in the left.  No significant abdominal pain.  She has good internal and external rotation of the right hip and small amount of pain with palpation of the right groin/superficial abductors.  No significant knee pain or pain with knee flexion and extension.  She has a well-healed lesion across the anterior knee that appears to be subacute.  Exam performed in the presence of Christoper Fabian, ATC.   ASSESSMENT & PLAN:   1. Right groin pain   2. Snake bite, initial encounter   3. Acute lymphadenitis of lower extremity     PLAN: She has a acute lymphadenitis and given the previous snakebite 2 weeks ago, I will go ahead and treat her with Augmentin as well as start her on anti-inflammatories.  There is not appear  to be any significant soft tissue abscess or fluid collection but given the acuity and significant pain antibiotics indicated at this time.  Follow-up in 1 week for clinical reexam and to ensure improvement, if persistent symptoms will need further lab work-up.  Tetanus updated.  Follow-up: Return in about 1 week (around 02/23/2018), or if symptoms worsen or fail to improve.      Please see  additional documentation for Objective, Assessment and Plan sections. Pertinent additional documentation may be included in corresponding procedure notes, imaging studies, problem based documentation and patient instructions. Please see these sections of the encounter for additional information regarding this visit.  CMA/ATC served as Neurosurgeon during this visit. History, Physical, and Plan performed by medical provider. Documentation and orders reviewed and attested to.      Andrena Mews, DO    [facility] Physician

## 2018-02-16 NOTE — Patient Instructions (Signed)
consideration of hernia vs reactive lymph gland  Or  Groin area   Problem  Issues .   Plan

## 2018-02-23 ENCOUNTER — Encounter: Payer: Self-pay | Admitting: Sports Medicine

## 2018-02-23 ENCOUNTER — Ambulatory Visit (INDEPENDENT_AMBULATORY_CARE_PROVIDER_SITE_OTHER): Payer: BC Managed Care – PPO | Admitting: Sports Medicine

## 2018-02-23 VITALS — BP 110/78 | HR 85 | Ht 61.0 in | Wt 177.2 lb

## 2018-02-23 DIAGNOSIS — R1031 Right lower quadrant pain: Secondary | ICD-10-CM | POA: Diagnosis not present

## 2018-02-23 DIAGNOSIS — W5911XD Bitten by nonvenomous snake, subsequent encounter: Secondary | ICD-10-CM | POA: Diagnosis not present

## 2018-02-23 DIAGNOSIS — L043 Acute lymphadenitis of lower limb: Secondary | ICD-10-CM

## 2018-02-23 DIAGNOSIS — R1909 Other intra-abdominal and pelvic swelling, mass and lump: Secondary | ICD-10-CM

## 2018-02-23 NOTE — Progress Notes (Signed)
Candace Shaw. Candace Shaw [facility] at [facility] [phone]  Candace Shaw - 22 y.o. female MRN [id number]  Date of birth: 1996-02-12  Visit Date: 02/23/2018  PCP: Candace Headings, MD   Referred by: Candace Headings, MD  Scribe(s) for today's visit: Candace Shaw, CMA  SUBJECTIVE:  Candace Shaw is here for Follow-up (R groin pain)   02/16/2018: Her R groin pain symptoms INITIALLY: Began approximately 5 days ago w/ no known MOI Described as moderate aching pain, radiating to R anterior-medial thigh Worsened with walking, coughing, sneezing, ascending  stairs Improved with nothing noted Additional associated symptoms include: no R hip mechanical symptoms and no N/T into R LE.  Swelling noted in R groin lymph nodes   At this time symptoms are worsening compared to onset  She has been to see Dr. Fabian Shaw and was referred to Dr. Berline Shaw. Patient also reports that she [place] snakebite was on the right posterior gluteal region.  Reports some minor bleeding but no significant symptoms at this time.  02/23/2018: Compared to the last office visit, her previously described symptoms are improving, she does still have some soreness and swellng.  Current symptoms are mild & are radiating to the R anterior-medial thigh She has been taking Augmentin, has 2-3 days left. She has been taking IBU with some relief.    REVIEW OF SYSTEMS: Denies night time disturbances. Reports fevers, chills, or night sweats initially - sx have improved.  Denies unexplained weight loss. Denies personal history of cancer. Denies changes in bowel or bladder habits. Denies recent unreported falls. Denies new or worsening dyspnea or wheezing. Denies headaches or dizziness.  Denies numbness, tingling or weakness  In the extremities.  Denies dizziness or presyncopal episodes Reports lower extremity edema    HISTORY &  PERTINENT PRIOR DATA:  Prior History reviewed and updated per electronic medical record.  Significant/pertinent history, findings, studies include:  reports that she has quit smoking. Her smoking use included cigarettes. She has a 2.00 pack-year smoking history. She has never used smokeless tobacco. Recent Labs    09/29/17 1132  HGBA1C 5.8   No specialty comments available. No problems updated.  OBJECTIVE:  VS:  HT:5\' 1"  (154.9 cm)   WT:177 lb 3.2 oz (80.4 kg)  BMI:33.5    BP:110/78  HR:85bpm  TEMP: ( )  RESP:96 %   PHYSICAL EXAM: Constitutional: WDWN, Non-toxic appearing. Psychiatric: Alert & appropriately interactive.  Not depressed or anxious appearing. Respiratory: No increased work of breathing.  Trachea Midline Eyes: Pupils are equal.  EOM intact without nystagmus.  No scleral icterus  Vascular Exam: warm to touch no edema  lower extremity neuro exam: unremarkable  MSK Exam: Right groin is overall much less swollen and much less tender but she does still have a palpable nodule over the anterior groin as well as slight tracking down into the medial adductors.  No significant overlying skin changes other than well-healed scarring.   ASSESSMENT & PLAN:   1. Right groin pain   2. Acute lymphadenitis of lower extremity   3. Groin swelling   4. Snake bite, subsequent encounter     PLAN: She is doing better.  We will have her continue with the antibiotics until completion.  Okay to wean off of anti-inflammatories as tolerated discussed red flag symptoms for earlier return as well as the likelihood that this will continue to  be present for quite some time which is not unusual after a reactive lymph node.  Tenderness and pain should go away and recommend trying to avoid relating the area.  Ice as needed.  If any worsening symptoms can consider further diagnostic evaluation.  Plan to repeat ultrasound in 3 weeks for re-measurement.  Follow-up: Return in about 3 weeks  (around 03/16/2018).      Please see additional documentation for Objective, Assessment and Plan sections. Pertinent additional documentation may be included in corresponding procedure notes, imaging studies, problem based documentation and patient instructions. Please see these sections of the encounter for additional information regarding this visit.  CMA/ATC served as Neurosurgeon during this visit. History, Physical, and Plan performed by medical provider. Documentation and orders reviewed and attested to.      Candace Mews, DO    [facility] Physician

## 2018-03-21 ENCOUNTER — Ambulatory Visit: Payer: BC Managed Care – PPO | Admitting: Sports Medicine

## 2018-05-10 DIAGNOSIS — N6452 Nipple discharge: Secondary | ICD-10-CM | POA: Diagnosis not present

## 2018-05-11 ENCOUNTER — Other Ambulatory Visit: Payer: Self-pay | Admitting: Obstetrics

## 2018-05-11 DIAGNOSIS — N6452 Nipple discharge: Secondary | ICD-10-CM

## 2018-05-12 ENCOUNTER — Ambulatory Visit (INDEPENDENT_AMBULATORY_CARE_PROVIDER_SITE_OTHER): Payer: BC Managed Care – PPO | Admitting: Family Medicine

## 2018-05-12 ENCOUNTER — Encounter: Payer: Self-pay | Admitting: Family Medicine

## 2018-05-12 VITALS — BP 110/84 | HR 93 | Temp 98.6°F | Wt 178.0 lb

## 2018-05-12 DIAGNOSIS — R51 Headache: Secondary | ICD-10-CM | POA: Diagnosis not present

## 2018-05-12 DIAGNOSIS — R519 Headache, unspecified: Secondary | ICD-10-CM

## 2018-05-12 DIAGNOSIS — R509 Fever, unspecified: Secondary | ICD-10-CM | POA: Diagnosis not present

## 2018-05-12 LAB — CBC WITH DIFFERENTIAL/PLATELET
Basophils Absolute: 0.1 10*3/uL (ref 0.0–0.1)
Basophils Relative: 0.6 % (ref 0.0–3.0)
EOS PCT: 1.8 % (ref 0.0–5.0)
Eosinophils Absolute: 0.2 10*3/uL (ref 0.0–0.7)
HEMATOCRIT: 39.9 % (ref 36.0–46.0)
HEMOGLOBIN: 13.9 g/dL (ref 12.0–15.0)
LYMPHS PCT: 27.5 % (ref 12.0–46.0)
Lymphs Abs: 2.8 10*3/uL (ref 0.7–4.0)
MCHC: 34.8 g/dL (ref 30.0–36.0)
MCV: 86.7 fl (ref 78.0–100.0)
MONOS PCT: 4.5 % (ref 3.0–12.0)
Monocytes Absolute: 0.5 10*3/uL (ref 0.1–1.0)
Neutro Abs: 6.8 10*3/uL (ref 1.4–7.7)
Neutrophils Relative %: 65.6 % (ref 43.0–77.0)
Platelets: 306 10*3/uL (ref 150.0–400.0)
RBC: 4.61 Mil/uL (ref 3.87–5.11)
RDW: 12.4 % (ref 11.5–15.5)
WBC: 10.4 10*3/uL (ref 4.0–10.5)

## 2018-05-12 LAB — BASIC METABOLIC PANEL
BUN: 15 mg/dL (ref 6–23)
CALCIUM: 9.3 mg/dL (ref 8.4–10.5)
CHLORIDE: 102 meq/L (ref 96–112)
CO2: 28 mEq/L (ref 19–32)
Creatinine, Ser: 0.71 mg/dL (ref 0.40–1.20)
GFR: 106.99 mL/min (ref >60.00)
GLUCOSE: 78 mg/dL (ref 70–99)
Potassium: 4.1 mEq/L (ref 3.5–5.1)
Sodium: 137 mEq/L (ref 135–145)

## 2018-05-12 LAB — T4, FREE: Free T4: 0.78 ng/dL (ref 0.60–1.60)

## 2018-05-12 LAB — T3, FREE: T3, Free: 4 pg/mL (ref 2.3–4.2)

## 2018-05-12 LAB — TSH: TSH: 2.9 u[IU]/mL (ref 0.35–4.50)

## 2018-05-12 NOTE — Progress Notes (Signed)
   Subjective:    Patient ID: Candace Shaw, female    DOB: [birth year], 22 y.o.   MRN: [id number]  HPI Here asking to be checked for Legionnaires disease. She attended the fair in [place] 2 weeks ago where several cases of Legionaires was diagnosed. For the past 2 weeks she has had intermittent low grade fevers, hot flashes, diarrhea, mild headaches, and blurred vision. In addition she reports that she has had some mild swelling in the left breast and some DC from the left nipple in the past 4 weeks. She saw Dr. Jerelyn Shaw, her GYN,2 days ago and she had some blood tests drawn. That day her Mirena IUD was removed and she was started on Junel Fe. She is scheduled for a mammogram and US of the breast next week. She has been treated for depression by Dr. Pauline Shaw and she has been on [place] and Lamictal for about 2 years.   Review of Systems  Constitutional: Positive for chills, diaphoresis and fever.  HENT: Negative.   Eyes: Positive for visual disturbance.  Respiratory: Negative.   Cardiovascular: Negative.   Gastrointestinal: Positive for diarrhea. Negative for abdominal distention, abdominal pain, nausea and vomiting.  Genitourinary: Negative.   Neurological: Positive for headaches.       Objective:   Physical Exam  Constitutional: She is oriented to person, place, and time. She appears well-developed and well-nourished. No distress.  HENT:  Right Ear: External ear normal.  Left Ear: External ear normal.  Nose: Nose normal.  Mouth/Throat: Oropharynx is clear and moist.  Eyes: Conjunctivae are normal.  Neck: Normal range of motion. Neck supple. No thyromegaly present.  Cardiovascular: Normal rate, regular rhythm, normal heart sounds and intact distal pulses.  Pulmonary/Chest: Effort normal and breath sounds normal. No stridor. No respiratory distress. She has no wheezes. She has no rales.  Abdominal: Soft. Bowel sounds are normal. She exhibits no distension and no mass.  There is no tenderness. There is no rebound and no guarding.  Lymphadenopathy:    She has no cervical adenopathy.  Neurological: She is alert and oriented to person, place, and time.          Assessment & Plan:  She has 4 weeks of breast swelling and nipple DC and now 2 weeks of blurred vision, headaches, hot flashes and diarrhea. I told her it is very unlikely she has Legionaires disease since she has no respiratory symptoms but we will test for Legionella antibodies anyway. Check a CBC and a thyroid panel. I think the most likely explanation for all these symptoms is side effects from her Latuda, but we will wait to see what her test results reveal.  Candace Penna, MD

## 2018-05-16 ENCOUNTER — Telehealth: Payer: Self-pay | Admitting: Internal Medicine

## 2018-05-16 LAB — LEGIONELLA PNEUMOPHILA TOTAL AB

## 2018-05-16 NOTE — Telephone Encounter (Signed)
Copied from [place] [phone]. Topic: Quick Communication - See Telephone Encounter >> May 16, 2018 10:20 AM Berneta Levins wrote: CRM for notification. See Telephone encounter for: 05/16/18.  Pt states she was just called with lab results and told everything was normal.  Pt wants to know what next steps are in resolving the symptoms (headache for 3 weeks) she is having. Pt can be reached at [phone]

## 2018-05-16 NOTE — Telephone Encounter (Signed)
Dr. Sarajane Jews please advise on the next steps to help the pt with headache x 3 weeks.

## 2018-05-17 NOTE — Telephone Encounter (Signed)
First off,  I will set up a head CT scan soon to rule out tumors, bleeding, and other sources of headache. Also I told her the day she was here I felt the Latuda could be the source of most of her symptoms. She should contact Dr. Ronnald Ramp, her psychiatrist, about this and see if she will taper her off the [place].

## 2018-05-17 NOTE — Addendum Note (Signed)
Addended by: Alysia Penna A on: 05/17/2018 05:59 PM   Modules accepted: Orders

## 2018-05-18 NOTE — Telephone Encounter (Signed)
I have called the pt and she is aware of what Dr. Sarajane Jews suggested and that they will call her to set up the ct scan.

## 2018-05-25 ENCOUNTER — Inpatient Hospital Stay: Admission: RE | Admit: 2018-05-25 | Payer: Self-pay | Source: Ambulatory Visit

## 2018-06-01 ENCOUNTER — Ambulatory Visit (INDEPENDENT_AMBULATORY_CARE_PROVIDER_SITE_OTHER)
Admission: RE | Admit: 2018-06-01 | Discharge: 2018-06-01 | Disposition: A | Discharge status: Home / Self Care | Payer: 59 | Source: Ambulatory Visit | Attending: Family Medicine | Admitting: Family Medicine

## 2018-06-01 DIAGNOSIS — R51 Headache: Secondary | ICD-10-CM | POA: Diagnosis not present

## 2018-06-01 DIAGNOSIS — R519 Headache, unspecified: Secondary | ICD-10-CM

## 2018-06-07 ENCOUNTER — Emergency Department (HOSPITAL_COMMUNITY)
Admission: EM | Admit: 2018-06-07 | Discharge: 2018-06-07 | Disposition: A | Discharge status: Home / Self Care | Payer: 59 | Attending: Emergency Medicine | Admitting: Emergency Medicine

## 2018-06-07 ENCOUNTER — Encounter (HOSPITAL_COMMUNITY): Payer: Self-pay

## 2018-06-07 ENCOUNTER — Ambulatory Visit
Admission: RE | Admit: 2018-06-07 | Discharge: 2018-06-07 | Disposition: A | Discharge status: Home / Self Care | Payer: 59 | Source: Ambulatory Visit | Attending: Obstetrics | Admitting: Obstetrics

## 2018-06-07 ENCOUNTER — Other Ambulatory Visit: Payer: Self-pay

## 2018-06-07 DIAGNOSIS — J45909 Unspecified asthma, uncomplicated: Secondary | ICD-10-CM | POA: Insufficient documentation

## 2018-06-07 DIAGNOSIS — Y999 Unspecified external cause status: Secondary | ICD-10-CM | POA: Insufficient documentation

## 2018-06-07 DIAGNOSIS — Z87891 Personal history of nicotine dependence: Secondary | ICD-10-CM | POA: Diagnosis not present

## 2018-06-07 DIAGNOSIS — N6489 Other specified disorders of breast: Secondary | ICD-10-CM | POA: Diagnosis not present

## 2018-06-07 DIAGNOSIS — N6452 Nipple discharge: Secondary | ICD-10-CM

## 2018-06-07 DIAGNOSIS — X788XXA Intentional self-harm by other sharp object, initial encounter: Secondary | ICD-10-CM | POA: Insufficient documentation

## 2018-06-07 DIAGNOSIS — S59912A Unspecified injury of left forearm, initial encounter: Secondary | ICD-10-CM | POA: Diagnosis present

## 2018-06-07 DIAGNOSIS — Y9389 Activity, other specified: Secondary | ICD-10-CM | POA: Diagnosis not present

## 2018-06-07 DIAGNOSIS — Z79899 Other long term (current) drug therapy: Secondary | ICD-10-CM | POA: Insufficient documentation

## 2018-06-07 DIAGNOSIS — Y929 Unspecified place or not applicable: Secondary | ICD-10-CM | POA: Insufficient documentation

## 2018-06-07 DIAGNOSIS — Z7289 Other problems related to lifestyle: Secondary | ICD-10-CM | POA: Insufficient documentation

## 2018-06-07 DIAGNOSIS — S51812A Laceration without foreign body of left forearm, initial encounter: Secondary | ICD-10-CM | POA: Insufficient documentation

## 2018-06-07 MED ORDER — LIDOCAINE-EPINEPHRINE (PF) 2 %-1:200000 IJ SOLN
10.0000 mL | Freq: Once | INTRAMUSCULAR | Status: AC
  Administered 2018-06-07: 10 mL
  Filled 2018-06-07: qty 20

## 2018-06-07 MED ORDER — BACITRACIN ZINC 500 UNIT/GM EX OINT
TOPICAL_OINTMENT | CUTANEOUS | Status: AC
  Filled 2018-06-07: qty 2.7

## 2018-06-07 MED ORDER — LIDOCAINE-EPINEPHRINE-TETRACAINE (LET) SOLUTION
3.0000 mL | Freq: Once | NASAL | Status: AC
  Administered 2018-06-07: 3 mL via TOPICAL
  Filled 2018-06-07: qty 3

## 2018-06-07 NOTE — ED Provider Notes (Signed)
[facility] DEPT Provider Note  CSN: [id number] Arrival date & time: 06/07/18  2106  History   Chief Complaint Chief Complaint  Patient presents with  . Laceration    HPI Candace Shaw is a 22 y.o. female with a psychiatric history of personality disorder, bipolar disorder and substance use who presented to the ED for forearm laceration. Patient admits to cutting her arms with a razor blade as a method of stress/emotional release, but denies suicidal intent. Bleeding controlled prior to arrival. Patient not on anticoagulant. Denies color or temperature change in arm, paresthesias or decreased ROM.   Laceration   The incident occurred 1 to 2 hours ago. The laceration is located on the left arm. Size: Numerous superficial cuts. Biggest lac is 4cm x 29mm. The laceration mechanism was a a razor. The pain is at a severity of 3/10. The pain is mild. She reports no foreign bodies present. Her tetanus status is UTD.    Past Medical History:  Diagnosis Date  . Asthma   . Bipolar disorder ([place])   . Depression   . Hallucinogen dependence, unspecified 10/03/2012  . Headache(784.0)   . Obesity   . Personality disorder [facility])     Patient Active Problem List   Diagnosis Date Noted  . Snake bite 02/16/2018  . Acute lymphadenitis of lower extremity 02/16/2018  . Nonallopathic lesion of thoracic region 06/30/2016  . Nonallopathic lesion of rib cage 06/30/2016  . Nonallopathic lesion of cervical region 06/30/2016  . Scapular dyskinesis 04/29/2016  . Subacromial bursitis 04/29/2016  . Substance abuse in remission ([place]) 03/28/2015  . Headache(784.0) 12/14/2013  . OCP (oral contraceptive pills) initiation 08/15/2013  . Febrile respiratory illness 02/13/2013  . Smoker 02/13/2013  . Depression 12/05/2012  . Tobacco user 08/19/2012  . Exercise-induced asthma 04/03/2011    Past Surgical History:  Procedure Laterality Date  . WISDOM TOOTH EXTRACTION  Age 24       OB History   None      Home Medications    Prior to Admission medications   Medication Sig Start Date End Date Taking? Authorizing Provider  hydrOXYzine (VISTARIL) 25 MG capsule Take 25 mg by mouth 3 (three) times daily as needed.    [provider]  ibuprofen (ADVIL,MOTRIN) 800 MG tablet Take 1 tablet (800 mg total) by mouth every 8 (eight) hours as needed. 02/16/18   Gerda Diss, DO  lamoTRIgine (LAMICTAL) 25 MG tablet Take 1 tablet (25 mg total) by mouth daily. Patient taking differently: Take 100 mg by mouth daily.  12/06/16   Kerrie Buffalo, NP  lisdexamfetamine (VYVANSE) 20 MG capsule Take 20 mg by mouth daily.    [provider]  lurasidone (LATUDA) 20 MG TABS tablet Take 1 tablet (20 mg total) by mouth daily. 12/05/16   Kerrie Buffalo, NP  norethindrone-ethinyl estradiol (JUNEL FE,GILDESS FE,LOESTRIN FE) 1-20 MG-MCG tablet Take 1 tablet by mouth daily.    [provider]    Family History Family History  Adopted: Yes  Problem Relation Age of Onset  . Alcohol abuse Mother   . Alcohol abuse Father     Social History Social History   Tobacco Use  . Smoking status: Former Smoker    Packs/day: 0.50    Years: 4.00    Pack years: 2.00    Types: Cigarettes  . Smokeless tobacco: Never Used  Substance Use Topics  . Alcohol use: No    Comment: used to drink heavily; relapsed in march  2017; sober since  . Drug use: No    Comment: Acid, mushrooms, DNT     Allergies   Zolpidem tartrate   Review of Systems Review of Systems  Skin: Positive for wound.  All other systems reviewed and are negative.    Physical Exam Updated Vital Signs BP 132/84 (BP Location: Right Arm)   Pulse 97   Temp 98 F (36.7 C) (Oral)   Resp 18   SpO2 99%   Physical Exam  Constitutional: Vital signs are normal. She appears well-developed and well-nourished.  Musculoskeletal: Normal range of motion.  Skin:  Numerous superficial lacerations and  abrasions to the posterior aspect of the left forearm. Only 1 laceration needs repair and it is ~ 4cm x 75mm.   Nursing note and vitals reviewed.  ED Treatments / Results  Labs (all labs ordered are listed, but only abnormal results are displayed) Labs Reviewed - No data to display  EKG None  Radiology US Breast Ltd Uni Left Inc Axilla  Result Date: 06/07/2018 CLINICAL DATA:  The patient reports discharge from the LEFT breast. She reports spontaneous discharge off and on for 1 week approximately 6 weeks ago. She describes the discharge as clear to milky, not associated with pain or swelling. She reports that blood work was normal. Patient subsequently had an IUD removed and is now taking birth control pills. She has not had any other episodes of discharge. Bilateral nipple piercings approximately 1 year ago. EXAM: ULTRASOUND OF THE LEFT BREAST COMPARISON:  Baseline evaluation FINDINGS: On physical exam, patient has removed LEFT nipple ring. There is no erythema, skin thickening, or palpable retroareolar mass. I am able to express clear to milky discharge from the LEFT nipple, coming from a central duct orifice. Small amount of discharge is probably coming from other ducts, but this is difficult to confirm. Targeted ultrasound is performed, showing normal appearing retroareolar breast tissue. No suspicious mass, distortion, or acoustic shadowing is demonstrated with ultrasound. IMPRESSION: Spontaneous LEFT nipple discharge, not associated with sonographic evidence for malignancy. It is difficult to determine if discharge is emanating from a single duct or from multiple ducts on exam today. RECOMMENDATION: If discharge is persistent, I would recommend a follow-up physical exam and possible breast ultrasound. If exam shows single duct discharge, I recommend surgical consultation and possible MRI. The patient will contact the [facility] if she has persistent discharge. I have discussed the findings and  recommendations with the patient. Results were also provided in writing at the conclusion of the visit. If applicable, a reminder letter will be sent to the patient regarding the next appointment. BI-RADS CATEGORY  2: Benign. Electronically Signed   By: Nolon Nations M.D.   On: 06/07/2018 13:38    Procedures .Marland KitchenLaceration Repair Date/Time: 06/07/2018 11:05 PM Performed by: Romie Jumper, PA-C Authorized by: Romie Jumper, PA-C   Consent:    Consent obtained:  Verbal   Consent given by:  Patient   Risks discussed:  Infection, pain, poor cosmetic result and poor wound healing   Alternatives discussed:  No treatment Anesthesia (see MAR for exact dosages):    Anesthesia method:  Local infiltration   Local anesthetic:  Lidocaine 2% WITH epi Laceration details:    Location:  Shoulder/arm   Shoulder/arm location:  L lower arm   Length (cm):  4   Depth (mm):  3 Repair type:    Repair type:  Simple Pre-procedure details:    Preparation:  Patient was prepped and draped  in usual sterile fashion Exploration:    Hemostasis achieved with:  LET and direct pressure   Wound exploration: wound explored through full range of motion and entire depth of wound probed and visualized   Treatment:    Area cleansed with:  Betadine   Amount of cleaning:  Standard Skin repair:    Repair method:  Sutures   Suture size:  4-0   Suture material:  Prolene   Number of sutures:  3 Approximation:    Approximation:  Close Post-procedure details:    Dressing:  Sterile dressing   Patient tolerance of procedure:  Tolerated well, no immediate complications   (including critical care time)  Medications Ordered in ED Medications  bacitracin 500 UNIT/GM ointment (has no administration in time range)  lidocaine-EPINEPHrine-tetracaine (LET) solution (3 mLs Topical Given 06/07/18 2237)  lidocaine-EPINEPHrine (XYLOCAINE W/EPI) 2 %-1:200000 (PF) injection 10 mL (10 mLs Infiltration Given 06/07/18 2238)      Initial Impression / Assessment and Plan / ED Course  Triage vital signs and the nursing notes have been reviewed.  Pertinent labs & imaging results that were available during care of the patient were reviewed and considered in medical decision making (see chart for details).   Patient presents with multiple superficial lacerations to her left forearm. This occurs in the context of cutting as an emotional release and there is no underlying SI or acute psychiatric need that requires additional evaluation. Only 1 laceration needs to be repaired. Patient is UTD on tetanus.   Final Clinical Impressions(s) / ED Diagnoses  1. Forearm Laceration. 3 sutures placed without complication. Education provided on wound care, follow-up and s/s of infection.  Dispo: Home. After thorough clinical evaluation, this patient is determined to be medically stable and can be safely discharged with the previously mentioned treatment and/or outpatient follow-up/referral(s). At this time, there are no other apparent medical conditions that require further screening, evaluation or treatment.   Final diagnoses:  Laceration of left forearm, initial encounter    ED Discharge Orders    None        Junita Push 06/07/18 2335    Sherwood Gambler, MD 06/08/18 [id number]

## 2018-06-07 NOTE — ED Triage Notes (Addendum)
Pt reports that she is a "cutter." She feels as if she cut herself too deeply on her upper posterior L forearm. Pt is bleeding, but it is easily controlled. Pt also has older cuts on her lower medial, anterior forearm that she states are from yesterday, but look concerning to this RN. Pt vehemently denies suicidal ideation or intent. A&Ox4. Ambulatory.

## 2018-06-07 NOTE — Discharge Instructions (Addendum)
You will need to return to your primary care or urgent care in 5-7 days to get these stitches removed. Keep the wound dry and covered for the next 24 hours. After that, you may wash with normal soap and water. You can use Neosporin and gauze after that if you would like.  Follow-up with a medical provider if you have one or more of the following symptoms: fever; increased redness, warmth or tenderness at the wound site; pain in joints beyond where the initial wound was; unusual discharge.  Be safe. Thank you for allowing [place] to take care of you today.

## 2018-06-28 DIAGNOSIS — Z79899 Other long term (current) drug therapy: Secondary | ICD-10-CM | POA: Diagnosis not present

## 2018-07-11 ENCOUNTER — Ambulatory Visit: Payer: Self-pay | Admitting: *Deleted

## 2018-07-11 ENCOUNTER — Other Ambulatory Visit: Payer: Self-pay

## 2018-07-11 ENCOUNTER — Encounter (HOSPITAL_COMMUNITY): Payer: Self-pay

## 2018-07-11 ENCOUNTER — Emergency Department (HOSPITAL_COMMUNITY): Payer: 59

## 2018-07-11 ENCOUNTER — Emergency Department (HOSPITAL_COMMUNITY)
Admission: EM | Admit: 2018-07-11 | Discharge: 2018-07-12 | Disposition: A | Discharge status: Home / Self Care | Payer: 59 | Attending: Emergency Medicine | Admitting: Emergency Medicine

## 2018-07-11 DIAGNOSIS — R55 Syncope and collapse: Secondary | ICD-10-CM | POA: Diagnosis not present

## 2018-07-11 DIAGNOSIS — Z87891 Personal history of nicotine dependence: Secondary | ICD-10-CM | POA: Diagnosis not present

## 2018-07-11 DIAGNOSIS — R51 Headache: Secondary | ICD-10-CM | POA: Insufficient documentation

## 2018-07-11 DIAGNOSIS — Z3202 Encounter for pregnancy test, result negative: Secondary | ICD-10-CM | POA: Insufficient documentation

## 2018-07-11 DIAGNOSIS — R630 Anorexia: Secondary | ICD-10-CM | POA: Insufficient documentation

## 2018-07-11 DIAGNOSIS — J45909 Unspecified asthma, uncomplicated: Secondary | ICD-10-CM | POA: Insufficient documentation

## 2018-07-11 DIAGNOSIS — Z79899 Other long term (current) drug therapy: Secondary | ICD-10-CM | POA: Insufficient documentation

## 2018-07-11 DIAGNOSIS — R519 Headache, unspecified: Secondary | ICD-10-CM

## 2018-07-11 LAB — CBC
HCT: 42.4 % (ref 36.0–46.0)
HEMOGLOBIN: 14.2 g/dL (ref 12.0–15.0)
MCH: 28.5 pg (ref 26.0–34.0)
MCHC: 33.5 g/dL (ref 30.0–36.0)
MCV: 85.1 fL (ref 80.0–100.0)
Platelets: 392 10*3/uL (ref 150–400)
RBC: 4.98 MIL/uL (ref 3.87–5.11)
RDW: 11.5 % (ref 11.5–15.5)
WBC: 9.4 10*3/uL (ref 4.0–10.5)
nRBC: 0 % (ref 0.0–0.2)

## 2018-07-11 LAB — I-STAT BETA HCG BLOOD, ED (MC, WL, AP ONLY)

## 2018-07-11 LAB — BASIC METABOLIC PANEL
ANION GAP: 13 (ref 5–15)
BUN: 11 mg/dL (ref 6–20)
CO2: 21 mmol/L — ABNORMAL LOW (ref 22–32)
Calcium: 9.5 mg/dL (ref 8.9–10.3)
Chloride: 104 mmol/L (ref 98–111)
Creatinine, Ser: 0.7 mg/dL (ref 0.44–1.00)
GFR calc Af Amer: >60 mL/min (ref >60)
Glucose, Bld: 97 mg/dL (ref 70–99)
POTASSIUM: 3.3 mmol/L — AB (ref 3.5–5.1)
SODIUM: 138 mmol/L (ref 135–145)

## 2018-07-11 LAB — URINALYSIS, ROUTINE W REFLEX MICROSCOPIC
Bilirubin Urine: NEGATIVE
Glucose, UA: NEGATIVE mg/dL
Hgb urine dipstick: NEGATIVE
KETONES UR: 20 mg/dL — AB
Leukocytes, UA: NEGATIVE
NITRITE: NEGATIVE
PROTEIN: NEGATIVE mg/dL
Specific Gravity, Urine: 1.02 (ref 1.005–1.030)
pH: 5 (ref 5.0–8.0)

## 2018-07-11 MED ORDER — DIPHENHYDRAMINE HCL 50 MG/ML IJ SOLN
25.0000 mg | Freq: Once | INTRAMUSCULAR | Status: AC
  Administered 2018-07-11: 25 mg via INTRAMUSCULAR
  Filled 2018-07-11: qty 1

## 2018-07-11 MED ORDER — PROCHLORPERAZINE EDISYLATE 10 MG/2ML IJ SOLN
10.0000 mg | Freq: Once | INTRAMUSCULAR | Status: AC
  Administered 2018-07-11: 10 mg via INTRAMUSCULAR
  Filled 2018-07-11: qty 2

## 2018-07-11 NOTE — ED Provider Notes (Signed)
[facility] DEPT Provider Note   CSN: [id number] Arrival date & time: 07/11/18  1613     History   Chief Complaint Chief Complaint  Patient presents with  . Loss of Consciousness    HPI Candace Shaw is a 22 y.o. female.  22 yo F with a chief complaint of syncope.  Patient states that she has not felt well today.  Has not been eating and drinking much and missed lunch.  She started feeling bad and went to go tell her boss that she needed to take a break and then she had a syncopal event.  She felt hot just before.  Denied exerting herself, struck her head when she fell.  Complaining of a headache.  Has had a headache for the past 3 or 4 days.  Denies unilateral numbness or weakness denies difficulty with speech or swallowing.  Her mom is here with her and feels that she is not acting like she normally is.  States that she is very slow to respond and is very blunts with her responses.  She has known psychiatric disease and is on multiple medications, just started on Vyvanse about 3 weeks ago.  Denies any issues with this medication.  No known history of sudden cardiac death in the family.  The history is provided by the patient.  Loss of Consciousness   This is a new problem. The current episode started 2 days ago. The problem occurs constantly. The problem has not changed since onset.There was no loss of consciousness. The problem is associated with normal activity. Associated symptoms include headaches. Pertinent negatives include chest pain, congestion, dizziness, fever, nausea, palpitations and vomiting. She has tried nothing for the symptoms. The treatment provided no relief.    Past Medical History:  Diagnosis Date  . Asthma   . Bipolar disorder ([place])   . Depression   . Hallucinogen dependence, unspecified 10/03/2012  . Headache(784.0)   . Obesity   . Personality disorder [facility])     Patient Active Problem List   Diagnosis Date Noted  .  Snake bite 02/16/2018  . Acute lymphadenitis of lower extremity 02/16/2018  . Nonallopathic lesion of thoracic region 06/30/2016  . Nonallopathic lesion of rib cage 06/30/2016  . Nonallopathic lesion of cervical region 06/30/2016  . Scapular dyskinesis 04/29/2016  . Subacromial bursitis 04/29/2016  . Substance abuse in remission ([place]) 03/28/2015  . Headache(784.0) 12/14/2013  . OCP (oral contraceptive pills) initiation 08/15/2013  . Febrile respiratory illness 02/13/2013  . Smoker 02/13/2013  . Depression 12/05/2012  . Tobacco user 08/19/2012  . Exercise-induced asthma 04/03/2011    Past Surgical History:  Procedure Laterality Date  . WISDOM TOOTH EXTRACTION  Age 5     OB History   None      Home Medications    Prior to Admission medications   Medication Sig Start Date End Date Taking? Authorizing Provider  hydrOXYzine (VISTARIL) 25 MG capsule Take 25 mg by mouth 3 (three) times daily as needed.    [provider]  ibuprofen (ADVIL,MOTRIN) 800 MG tablet Take 1 tablet (800 mg total) by mouth every 8 (eight) hours as needed. 02/16/18   Gerda Diss, DO  lamoTRIgine (LAMICTAL) 25 MG tablet Take 1 tablet (25 mg total) by mouth daily. Patient taking differently: Take 100 mg by mouth daily.  12/06/16   Kerrie Buffalo, NP  lisdexamfetamine (VYVANSE) 20 MG capsule Take 20 mg by mouth daily.    [provider]  lurasidone (  LATUDA) 20 MG TABS tablet Take 1 tablet (20 mg total) by mouth daily. 12/05/16   Kerrie Buffalo, NP  norethindrone-ethinyl estradiol (JUNEL FE,GILDESS FE,LOESTRIN FE) 1-20 MG-MCG tablet Take 1 tablet by mouth daily.    [provider]    Family History Family History  Adopted: Yes  Problem Relation Age of Onset  . Alcohol abuse Mother   . Alcohol abuse Father     Social History Social History   Tobacco Use  . Smoking status: Former Smoker    Packs/day: 0.50    Years: 4.00    Pack years: 2.00    Types: Cigarettes  .  Smokeless tobacco: Never Used  Substance Use Topics  . 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 Drug use: Yes    Types: Marijuana    Comment: occasionally     Allergies   Zolpidem tartrate   Review of Systems Review of Systems  Constitutional: Negative for chills and fever.  HENT: Negative for congestion and rhinorrhea.   Eyes: Negative for redness and visual disturbance.  Respiratory: Negative for shortness of breath and wheezing.   Cardiovascular: Positive for syncope. Negative for chest pain and palpitations.  Gastrointestinal: Negative for nausea and vomiting.  Genitourinary: Negative for dysuria and urgency.  Musculoskeletal: Negative for arthralgias and myalgias.  Skin: Negative for pallor and wound.  Neurological: Positive for syncope and headaches. Negative for dizziness.     Physical Exam Updated Vital Signs BP (!) 126/96   Pulse 88   Temp 98.3 F (36.8 C) (Oral)   Resp 19   Ht 5\' 1"  (1.549 m)   Wt 77.1 kg   LMP 07/11/2018   SpO2 100%   BMI 32.12 kg/m   Physical Exam  Constitutional: She is oriented to person, place, and time. She appears well-developed and well-nourished. No distress.  HENT:  Head: Normocephalic and atraumatic.  Eyes: Pupils are equal, round, and reactive to light. EOM are normal.  Neck: Normal range of motion. Neck supple.  Cardiovascular: Normal rate and regular rhythm. Exam reveals no gallop and no friction rub.  No murmur heard. Pulmonary/Chest: Effort normal. She has no wheezes. She has no rales.  Abdominal: Soft. She exhibits no distension. There is no tenderness.  Musculoskeletal: She exhibits no edema or tenderness.  Neurological: She is alert and oriented to person, place, and time. She has normal strength. No cranial nerve deficit or sensory deficit. She displays a negative Romberg sign. Coordination and gait normal. GCS eye subscore is 4. GCS verbal subscore is 5.  GCS motor subscore is 6.  Benign neurologic exam  Skin: Skin is warm and dry. She is not diaphoretic.  Psychiatric: Her behavior is normal. Her affect is blunt. Her speech is delayed.  Nursing note and vitals reviewed.    ED Treatments / Results  Labs (all labs ordered are listed, but only abnormal results are displayed) Labs Reviewed  BASIC METABOLIC PANEL - Abnormal; Notable for the following components:      Result Value   Potassium 3.3 (*)    CO2 21 (*)    All other components within normal limits  URINALYSIS, ROUTINE W REFLEX MICROSCOPIC - Abnormal; Notable for the following components:   Ketones, ur 20 (*)    All other components within normal limits  CBC  CBG MONITORING, ED  I-STAT BETA HCG BLOOD, ED ([facility], [facility], [facility] ONLY)    EKG EKG Interpretation  Date/Time:  Tuesday July 11 2018 16:27:04 EST Ventricular Rate:  82 PR Interval:    QRS Duration: 84 QT Interval:  369 QTC Calculation: 431 R Axis:   92 Text Interpretation:  Sinus rhythm Borderline right axis deviation Baseline wander in lead(s) I III aVL No significant change since last tracing Confirmed by Deno Etienne [phone]) on 07/11/2018 9:54:39 PM   Radiology Ct Head Wo Contrast  Result Date: 07/11/2018 CLINICAL DATA:  Fall, loss of consciousness.  Headache. EXAM: CT HEAD WITHOUT CONTRAST TECHNIQUE: Contiguous axial images were obtained from the base of the skull through the vertex without intravenous contrast. COMPARISON:  06/01/2018 FINDINGS: Brain: No acute intracranial abnormality. Specifically, no hemorrhage, hydrocephalus, mass lesion, acute infarction, or significant intracranial injury. Vascular: No hyperdense vessel or unexpected calcification. Skull: No acute calvarial abnormality. Sinuses/Orbits: Visualized paranasal sinuses and mastoids clear. Orbital soft tissues unremarkable. Other: None IMPRESSION: Normal study. Electronically Signed   By: Rolm Baptise M.D.   On: 07/11/2018 23:28     Procedures Procedures (including critical care time)  Medications Ordered in ED Medications  prochlorperazine (COMPAZINE) injection 10 mg (10 mg Intramuscular Given 07/11/18 2227)  diphenhydrAMINE (BENADRYL) injection 25 mg (25 mg Intramuscular Given 07/11/18 2228)     Initial Impression / Assessment and Plan / ED Course  I have reviewed the triage vital signs and the nursing notes.  Pertinent labs & imaging results that were available during my care of the patient were reviewed by me and considered in my medical decision making (see chart for details).     22 yo F with a chief complaint of syncope.  This happened earlier today.  Sounds vasovagal by history.  Patient also had been eating and drinking less today.  Her neuro exam is unremarkable, however with the family stating that she is acting different than normal obtain a CT of the head after this traumatic event.  EKG without prolonged QT Wolff-Parkinson-White or Brugada.  She is not anemic she is not pregnant.  She was complaining mostly of a headache will give a headache cocktail.  The patient was reassessed and feeling better.  CT the head is negative.  Discharge home.  Given neurology follow-up with her history of frequent recurrent headaches recently.  11:55 PM:  I have discussed the diagnosis/risks/treatment options with the patient and family and believe the pt to be eligible for discharge home to follow-up with PCP, neuro. We also discussed returning to the ED immediately if new or worsening sx occur. We discussed the sx which are most concerning (e.g., sudden worsening pain, fever, inability to tolerate by mouth) that necessitate immediate return. Medications administered to the patient during their visit and any new prescriptions provided to the patient are listed below.  Medications given during this visit Medications  prochlorperazine (COMPAZINE) injection 10 mg (10 mg Intramuscular Given 07/11/18 2227)  diphenhydrAMINE  (BENADRYL) injection 25 mg (25 mg Intramuscular Given 07/11/18 2228)      The patient appears reasonably screen and/or stabilized for discharge and I doubt any other medical condition or other [facility] requiring further screening, evaluation, or treatment in the ED at this time prior to discharge.    Final Clinical Impressions(s) / ED Diagnoses   Final diagnoses:  Syncope and collapse  Acute nonintractable headache, unspecified headache type    ED Discharge Orders         Ordered    Ambulatory referral to Neurology    Comments:  Headache syndrome   07/11/18 2344  Deno Etienne, DO 07/11/18 2355

## 2018-07-11 NOTE — ED Triage Notes (Signed)
Patient states she felt overheated and felt dizzy. Patient states she had LOC while standing up and hit her head. Patient denies any blood thinners.

## 2018-07-11 NOTE — Telephone Encounter (Signed)
Patient got flu shot yesterday- she feels warm and sweating and then cold. She feels faint and dizzy. Overwhelmed. Patient has also added Vyvanse to her medication list. Patient states she has only eaten 2 cliff bars today- she is hydrating now. Patient states she has had her BP and temperature checked today and they are normal. She is trying to figure out if her symptoms are mental or physical. Patient states he has had a lot of testing done recently - she thinks that some of her symptoms are things she normally experiences- hot flashes- red chest- but normally they go away. She can not think of any stressors that would cause her to continue to feel like this today. Advised patient she needs to be evaluated at ED- especially since she is still feeling faint and dizzy.   Reason for Disposition . Patient sounds very sick or weak to the triager    Patient has multiple symptoms- headache, dizziness, faint, some chest tightness, sometimes vision blurs- needs evaluation of her symptoms.  Answer Assessment - Initial Assessment Questions 1. SYMPTOM: "What is the main symptom you are concerned about?" (e.g., weakness, numbness)     Faintness and dizziness 2. ONSET: "When did this start?" (minutes, hours, days; while sleeping)     11:00 today 3. LAST NORMAL: "When was the last time you were normal (no symptoms)?"     3-4 days ago 4. PATTERN "Does this come and go, or has it been constant since it started?"  "Is it present now?"     Constant- and present now 5. CARDIAC SYMPTOMS: "Have you had any of the following symptoms: chest pain, difficulty breathing, palpitations?"     A little tight in her chest 6. NEUROLOGIC SYMPTOMS: "Have you had any of the following symptoms: headache, dizziness, vision loss, double vision, changes in speech, unsteady on your feet?"     Headache- for a few days, dizziness, sometimes it gets a little blurry 7. OTHER SYMPTOMS: "Do you have any other symptoms?"     Patient is on  cycle 8. PREGNANCY: "Is there any chance you are pregnant?" "When was your last menstrual period?"     no  Protocols used: NEUROLOGIC DEFICIT-A-AH

## 2018-07-11 NOTE — ED Notes (Signed)
Patient transported to CT 

## 2018-07-11 NOTE — Telephone Encounter (Signed)
Pt has arrived [facility] ED.

## 2018-07-17 ENCOUNTER — Ambulatory Visit: Payer: 59 | Admitting: Internal Medicine

## 2018-07-17 NOTE — Progress Notes (Deleted)
No chief complaint on file.   HPI: Candace Shaw 22 y.o. come in for FU ED visit fro syncope  ROS: See pertinent positives and negatives per HPI.  Past Medical History:  Diagnosis Date  . Asthma   . Bipolar disorder ([place])   . Depression   . Hallucinogen dependence, unspecified 10/03/2012  . Headache(784.0)   . Obesity   . Personality disorder ([place])     Family History  Adopted: Yes  Problem Relation Age of Onset  . Alcohol abuse Mother   . Alcohol abuse Father     Social History   Socioeconomic History  . Marital status: Single    Spouse name: Not on file  . Number of children: Not on file  . Years of education: Not on file  . Highest education level: Not on file  Occupational History  . Occupation: coffee shop   Social Needs  . Financial resource strain: Not on file  . Food insecurity:    Worry: Not on file    Inability: Not on file  . Transportation needs:    Medical: Not on file    Non-medical: Not on file  Tobacco Use  . Smoking status: Former Smoker    Packs/day: 0.50    Years: 4.00    Pack years: 2.00    Types: Cigarettes  . Smokeless tobacco: Never Used  Substance and Sexual Activity  . 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 Drug use: Yes    Types: Marijuana    Comment: occasionally  . Sexual activity: Not Currently    Partners: Female    Birth control/protection: Condom, OCP, Pill  Lifestyle  . Physical activity:    Days per week: Not on file    Minutes per session: Not on file  . Stress: Not on file  Relationships  . Social connections:    Talks on phone: Not on file    Gets together: Not on file    Attends religious service: Not on file    Active member of club or organization: Not on file    Attends meetings of clubs or organizations: Not on file    Relationship status: Not on file  Other Topics Concern  . 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 hydrOXYzine (VISTARIL) 25 MG capsule Take 25 mg by mouth 3 (three) times daily as needed.    Marland Kitchen ibuprofen (ADVIL,MOTRIN) 800 MG tablet Take 1 tablet (800 mg total) by mouth every 8 (eight) hours as needed. 60 tablet 1  . lamoTRIgine (LAMICTAL) 25 MG tablet Take 1 tablet (25 mg total) by mouth daily. (Patient taking differently: Take 100 mg by mouth daily. ) 14 tablet 1  . lisdexamfetamine (VYVANSE) 20 MG capsule Take 20 mg by mouth daily.    Marland Kitchen lurasidone (LATUDA) 20 MG TABS tablet Take 1 tablet (20 mg total) by mouth daily. 14 tablet 0  . norethindrone-ethinyl estradiol (JUNEL FE,GILDESS FE,LOESTRIN FE) 1-20 MG-MCG tablet Take 1 tablet by mouth daily.     No facility-administered medications prior to visit.  EXAM:  LMP 07/11/2018   There is no height or weight on file to calculate BMI.  GENERAL: vitals reviewed and listed above, alert, oriented, appears well hydrated and in no acute distress HEENT: atraumatic, conjunctiva  clear, no obvious abnormalities on inspection of external nose and ears OP : no lesion edema or exudate  NECK: no obvious masses on inspection palpation  LUNGS: clear to auscultation bilaterally, no wheezes, rales or rhonchi, good air movement CV: HRRR, no clubbing cyanosis or  peripheral edema nl cap refill  MS: moves all extremities without noticeable focal  abnormality PSYCH: pleasant and cooperative, no obvious depression or anxiety Lab Results  Component Value Date   WBC 9.4 07/11/2018   HGB 14.2 07/11/2018   HCT 42.4 07/11/2018   PLT 392 07/11/2018   GLUCOSE 97 07/11/2018   CHOL 147 09/29/2017   TRIG 82.0 09/29/2017   HDL 38.00 (L) 09/29/2017   LDLCALC 93 09/29/2017   ALT 20 09/29/2017   AST 17 09/29/2017   NA 138  07/11/2018   K 3.3 (L) 07/11/2018   CL 104 07/11/2018   CREATININE 0.70 07/11/2018   BUN 11 07/11/2018   CO2 21 (L) 07/11/2018   TSH 2.90 05/12/2018   HGBA1C 5.8 09/29/2017   BP Readings from Last 3 Encounters:  07/12/18 104/80  06/07/18 128/78  05/12/18 110/84    ASSESSMENT AND PLAN:  Discussed the following assessment and plan:  Syncope, unspecified syncope type Patient adopted  Mom has prolonged qt   Plan  Echo and neuro help with hx of ha  hx of  recururent  -Patient advised to return or notify health care team  if  new concerns arise.  There are no Patient Instructions on file for this visit.   Standley Brooking. Panosh M.D.  22 yo F with a chief complaint of syncope.  This happened earlier today.  Sounds vasovagal by history.  Patient also had been eating and drinking less today.  Her neuro exam is unremarkable, however with the family stating that she is acting different than normal obtain a CT of the head after this traumatic event.  EKG without prolonged QT Wolff-Parkinson-White or Brugada.  She is not anemic she is not pregnant.  She was complaining mostly of a headache will give a headache cocktail.  The patient was reassessed and feeling better.  CT the head is negative.  Discharge home.  Given neurology follow-up with her history of frequent recurrent headaches recently.  11:55 PM:  I have discussed the diagnosis/risks/treatment options with the patient and family and believe the pt to be eligible for discharge home to follow-up with PCP, neuro. We also discussed returning to the ED immediately if new or worsening sx occur. We discussed the sx which are most concerning (e.g., sudden worsening pain, fever, inability to tolerate by mouth) that necessitate immediate return. Medications administered to the patient during their visit and any new prescriptions provided to the patient are listed below.  Medications given during this visit Medications  prochlorperazine (COMPAZINE)  injection 10 mg (10 mg Intramuscular Given 07/11/18 2227)  diphenhydrAMINE (BENADRYL) injection 25 mg (25 mg Intramuscular Given 07/11/18 2228)      The patient appears reasonably screen and/or stabilized for discharge and I doubt any other medical condition or other [facility] requiring further screening, evaluation, or treatment in the ED at this time prior to discharge.    Final Clinical Impressions(s) / ED Diagnoses   Final diagnoses:  Syncope and collapse  Acute nonintractable headache,  unspecified headache type       ED Discharge Orders               Ordered     Ambulatory referral to Neurology    Comments:  Headache syndrome   07/11/18 [place], DO 07/11/18 2355      Clinical Impressions      Syncope and collapse    Acute nonintractable headache, unspecified headache type  Disposition      Discharge       ED After Visit Summary (Printed 07/11/2018)      Follow-Ups: Call Panosh, Standley Brooking, MD (Internal Medicine) in 1 day (07/12/2018); and discuss your visit here and see when they want to see you in the office.  Medication Changes

## 2018-09-01 ENCOUNTER — Ambulatory Visit: Payer: 59 | Admitting: Internal Medicine

## 2018-09-01 ENCOUNTER — Encounter: Payer: Self-pay | Admitting: Internal Medicine

## 2018-09-01 VITALS — BP 122/80 | HR 96 | Temp 97.9°F | Wt 189.6 lb

## 2018-09-01 DIAGNOSIS — J988 Other specified respiratory disorders: Secondary | ICD-10-CM | POA: Diagnosis not present

## 2018-09-01 DIAGNOSIS — H1032 Unspecified acute conjunctivitis, left eye: Secondary | ICD-10-CM

## 2018-09-01 DIAGNOSIS — J398 Other specified diseases of upper respiratory tract: Secondary | ICD-10-CM

## 2018-09-01 MED ORDER — POLYMYXIN B-TRIMETHOPRIM 10000-0.1 UNIT/ML-% OP SOLN: 1.0000 [drp] | Freq: Four times a day (QID) | OPHTHALMIC | 0 refills | Status: DC

## 2018-09-01 NOTE — Progress Notes (Signed)
Chief Complaint  Patient presents with  . Conjunctivitis    x 2 days. Pt c/o redness, itching, watering, burning, "feels like dirt in my eye" and discharge. Eyes were crusty and matted shut this morning. Left eyes worse than right.     HPI: Candace Shaw 23 y.o. come in for sda   Onset one day  Left eye crusted and red and feels scratchy but no fever face pain ear pain   Some cough and sniffles .  Off and on for a herw weeks but no cough and Random  No  ear  Pain  No contacts   ROS: See pertinent positives and negatives per HPI.  Past Medical History:  Diagnosis Date  . Asthma   . Bipolar disorder ([place])   . Depression   . Hallucinogen dependence, unspecified 10/03/2012  . Headache(784.0)   . Obesity   . Personality disorder ([place])     Family History  Adopted: Yes  Problem Relation Age of Onset  . Alcohol abuse Mother   . Alcohol abuse Father     Social History   Socioeconomic History  . Marital status: Single    Spouse name: Not on file  . Number of children: Not on file  . Years of education: Not on file  . Highest education level: Not on file  Occupational History  . Occupation: coffee shop   Social Needs  . Financial resource strain: Not on file  . Food insecurity:    Worry: Not on file    Inability: Not on file  . Transportation needs:    Medical: Not on file    Non-medical: Not on file  Tobacco Use  . Smoking status: Former Smoker    Packs/day: 0.50    Years: 4.00    Pack years: 2.00    Types: Cigarettes  . Smokeless tobacco: Never Used  Substance and Sexual Activity  . 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 Drug use: Yes    Types: Marijuana    Comment: occasionally  . Sexual activity: Not Currently    Partners: Female    Birth control/protection: Condom, OCP, Pill  Lifestyle  . Physical activity:    Days per week: Not on file    Minutes per session: Not on file  . Stress:  Not on file  Relationships  . Social connections:    Talks on phone: Not on file    Gets together: Not on file    Attends religious service: Not on file    Active member of club or organization: Not on file    Attends meetings of clubs or organizations: Not on file    Relationship status: Not on file  Other Topics Concern  . 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 Cariprazine HCl (VRAYLAR) 6 MG CAPS Take 1 capsule by mouth daily.    Marland Kitchen ibuprofen (ADVIL,MOTRIN) 800 MG tablet Take 1 tablet (800 mg total) by mouth every 8 (eight) hours as needed. 60 tablet 1  . lamoTRIgine (LAMICTAL) 100 MG tablet Take 100 mg by mouth  daily.    . lisdexamfetamine (VYVANSE) 20 MG capsule Take 20 mg by mouth daily.    . traZODone (DESYREL) 50 MG tablet Take 1 tablet by mouth at bedtime as needed.    . vortioxetine HBr (TRINTELLIX) 5 MG TABS tablet Take 5 mg by mouth daily.    . hydrOXYzine (VISTARIL) 25 MG capsule Take 25 mg by mouth 3 (three) times daily as needed.    . lamoTRIgine (LAMICTAL) 25 MG tablet Take 1 tablet (25 mg total) by mouth daily. (Patient not taking: Reported on 09/01/2018) 14 tablet 1  . lisdexamfetamine (VYVANSE) 20 MG capsule Take 20 mg by mouth daily.    Marland Kitchen lurasidone (LATUDA) 20 MG TABS tablet Take 1 tablet (20 mg total) by mouth daily. (Patient not taking: Reported on 09/01/2018) 14 tablet 0  . norethindrone-ethinyl estradiol (JUNEL FE,GILDESS FE,LOESTRIN FE) 1-20 MG-MCG tablet Take 1 tablet by mouth daily.     No facility-administered medications prior to visit.      EXAM:  BP 122/80 (BP Location: Right Arm, Patient Position: Sitting, Cuff Size: Normal)   Pulse 96   Temp 97.9 F (36.6 C) (Oral)   Wt 189 lb 9.6 oz (86 kg)   BMI  35.82 kg/m   Body mass index is 35.82 kg/m.  GENERAL: vitals reviewed and listed above, alert, oriented, appears well hydrated and in no acute distressleft eye lid red  Conj 2+  No lesion perrl no ciliary flushe  HEENT: atraumatic, conjunctiva  clear, no obvious abnormalities on inspection of external nose and earstms nl  OP : no lesion edema or exudate nose congestion no face pain  NECK: no obvious masses on inspection palpation  PSYCH: pleasant and cooperative, no obvious depression or anxiety  BP Readings from Last 3 Encounters:  09/01/18 122/80  07/12/18 104/80  06/07/18 128/78    ASSESSMENT AND PLAN:  Discussed the following assessment and plan:  Acute conjunctivitis of left eye, unspecified acute conjunctivitis type - see text and instr  prob bacterial expectant management   Congestion of upper respiratory tract No make up warm compresses and then drops 4 x per day for 5-7 days  Fu with alarm sx  Ur congestion ? [place] no obv bacterial infection at this time -Patient advised to return or notify health care team  if  new concerns arise.  Patient Instructions  Warm compresses and then eye drops   Up to 7 days     Fu if   persistent or progressive  Sever pain or major vision changes    Bacterial Conjunctivitis, Adult Bacterial conjunctivitis is an infection of your conjunctiva. This is the clear membrane that covers the white part of your eye and the inner part of your eyelid. This infection can make your eye:  Red or pink.  Itchy. This condition spreads easily from person to person (is contagious) and from one eye to the other eye. What are the causes?  This condition is caused by germs (bacteria). You may get the infection if you come into close contact with: ? A person who has the infection. ? Items that have germs on them (are contaminated), such as face towels, contact lens solution, or eye makeup. What increases the risk? You are more likely to get this  condition if you:  Have contact with people who have the infection.  Wear contact lenses.  Have a sinus infection.  Have had a recent eye injury or surgery.  Have a weak body defense system (immune system).  Have dry eyes. What are the signs or symptoms?   Thick, yellowish discharge from the eye.  Tearing or watery eyes.  Itchy eyes.  Burning feeling in your eyes.  Eye redness.  Swollen eyelids.  Blurred vision. How is this treated?   Antibiotic eye drops or ointment.  Antibiotic medicine taken by mouth. This is used for infections that do not get better with drops or ointment or that last more than 10 days.  Cool, wet cloths placed on the eyes.  Artificial tears used 2-6 times a day. Follow these instructions at home: Medicines  Take or apply your antibiotic medicine as told by your doctor. Do not stop taking or applying the antibiotic even if you start to feel better.  Take or apply over-the-counter and prescription medicines only as told by your doctor.  Do not touch your eyelid with the eye-drop bottle or the ointment tube. Managing discomfort  Wipe any fluid from your eye with a warm, wet washcloth or a cotton ball.  Place a clean, cool, wet cloth on your eye. Do this for 10-20 minutes, 3-4 times per day. General instructions  Do not wear contacts until the infection is gone. Wear glasses until your doctor says it is okay to wear contacts again.  Do not wear eye makeup until the infection is gone. Throw away old eye makeup.  Change or wash your pillowcase every day.  Do not share towels or washcloths.  Wash your hands often with soap and water. Use paper towels to dry your hands.  Do not touch or rub your eyes.  Do not drive or use heavy machinery if your vision is blurred. Contact a doctor if:  You have a fever.  You do not get better after 10 days. Get help right away if:  You have a fever and your symptoms get worse all of a  sudden.  You have very bad pain when you move your eye.  Your face: ? Hurts. ? Is red. ? Is swollen.  You have sudden loss of vision. Summary  Bacterial conjunctivitis is an infection of your conjunctiva.  This infection spreads easily from person to person.  Wash your hands often with soap and water. Use paper towels to dry your hands.  Take or apply your antibiotic medicine as told by your doctor.  Contact a doctor if you have a fever or you do not get better after 10 days. This information is not intended to replace advice given to you by your health care provider. Make sure you discuss any questions you have with your health care provider. Document Released: 05/04/2008 Document Revised: 03/01/2018 Document Reviewed: 03/01/2018 Elsevier Interactive Patient Education  2019 [place] K. Pervis Macintyre M.D.

## 2018-09-01 NOTE — Patient Instructions (Signed)
Warm compresses and then eye drops   Up to 7 days     Fu if   persistent or progressive  Sever pain or major vision changes    Bacterial Conjunctivitis, Adult Bacterial conjunctivitis is an infection of your conjunctiva. This is the clear membrane that covers the white part of your eye and the inner part of your eyelid. This infection can make your eye:  Red or pink.  Itchy. This condition spreads easily from person to person (is contagious) and from one eye to the other eye. What are the causes?  This condition is caused by germs (bacteria). You may get the infection if you come into close contact with: ? A person who has the infection. ? Items that have germs on them (are contaminated), such as face towels, contact lens solution, or eye makeup. What increases the risk? You are more likely to get this condition if you:  Have contact with people who have the infection.  Wear contact lenses.  Have a sinus infection.  Have had a recent eye injury or surgery.  Have a weak body defense system (immune system).  Have dry eyes. What are the signs or symptoms?   Thick, yellowish discharge from the eye.  Tearing or watery eyes.  Itchy eyes.  Burning feeling in your eyes.  Eye redness.  Swollen eyelids.  Blurred vision. How is this treated?   Antibiotic eye drops or ointment.  Antibiotic medicine taken by mouth. This is used for infections that do not get better with drops or ointment or that last more than 10 days.  Cool, wet cloths placed on the eyes.  Artificial tears used 2-6 times a day. Follow these instructions at home: Medicines  Take or apply your antibiotic medicine as told by your doctor. Do not stop taking or applying the antibiotic even if you start to feel better.  Take or apply over-the-counter and prescription medicines only as told by your doctor.  Do not touch your eyelid with the eye-drop bottle or the ointment tube. Managing  discomfort  Wipe any fluid from your eye with a warm, wet washcloth or a cotton ball.  Place a clean, cool, wet cloth on your eye. Do this for 10-20 minutes, 3-4 times per day. General instructions  Do not wear contacts until the infection is gone. Wear glasses until your doctor says it is okay to wear contacts again.  Do not wear eye makeup until the infection is gone. Throw away old eye makeup.  Change or wash your pillowcase every day.  Do not share towels or washcloths.  Wash your hands often with soap and water. Use paper towels to dry your hands.  Do not touch or rub your eyes.  Do not drive or use heavy machinery if your vision is blurred. Contact a doctor if:  You have a fever.  You do not get better after 10 days. Get help right away if:  You have a fever and your symptoms get worse all of a sudden.  You have very bad pain when you move your eye.  Your face: ? Hurts. ? Is red. ? Is swollen.  You have sudden loss of vision. Summary  Bacterial conjunctivitis is an infection of your conjunctiva.  This infection spreads easily from person to person.  Wash your hands often with soap and water. Use paper towels to dry your hands.  Take or apply your antibiotic medicine as told by your doctor.  Contact a doctor if you  have a fever or you do not get better after 10 days. This information is not intended to replace advice given to you by your health care provider. Make sure you discuss any questions you have with your health care provider. Document Released: 05/04/2008 Document Revised: 03/01/2018 Document Reviewed: 03/01/2018 Elsevier Interactive Patient Education  2019 [employer].

## 2018-09-19 ENCOUNTER — Encounter: Payer: Self-pay | Admitting: Sports Medicine

## 2018-09-19 ENCOUNTER — Ambulatory Visit (INDEPENDENT_AMBULATORY_CARE_PROVIDER_SITE_OTHER): Payer: 59 | Admitting: Sports Medicine

## 2018-09-19 VITALS — BP 118/80 | HR 76 | Ht 61.0 in | Wt 178.2 lb

## 2018-09-19 DIAGNOSIS — M216X2 Other acquired deformities of left foot: Secondary | ICD-10-CM

## 2018-09-19 DIAGNOSIS — M7742 Metatarsalgia, left foot: Secondary | ICD-10-CM

## 2018-09-19 NOTE — Progress Notes (Signed)
Candace Shaw. , [place] [facility]  Candace Shaw - 23 y.o. female MRN [id number]  Date of birth: 09-17-1995  Visit Date: September 19, 2018  PCP: Candace Medin, MD   Referred by: Candace Medin, MD  SUBJECTIVE:  Chief Complaint  Patient presents with  . Left Foot - Initial Assessment    Pain at toes x 3 days, n/t. Worse with wt bearing. Recently started working out and became vegan 7 days ago. C/o LBP.     HPI: Patient presents for worsening 3 days of left medial aspect foot pain.  She does not recall any specific injuries but has significantly increased her exercise and has been working out over the past 7 days as well as starting a vegan diet.  She has been using the exercise bike as well as treadmill.  She has been wearing Birkenstocks outside of the gym and comfortable tennis shoes with exercise.  The pain is present with both barefoot walking and in her Birkenstocks.  She has not tried taking any medications on a regular basis.  The pain does not specifically radiate into her leg.  She does report some associated periscapular pain and back pain with increased activities but this is minimal.  REVIEW OF SYSTEMS: No significant nighttime awakenings due to this issue. Denies fevers, chills, recent weight gain or weight loss.  No night sweats.  Pt denies any change in bowel or bladder habits, muscle weakness, numbness or falls associated with this pain. Some associated numbness and tingling of the left foot this is mild. Otherwise 12 point review of systems performed and is negative   HISTORY:  Prior history reviewed and updated per electronic medical record.  Patient Active Problem List   Diagnosis Date Noted  . Snake bite 02/16/2018  . Acute lymphadenitis of lower extremity 02/16/2018  . Nonallopathic lesion of thoracic region 06/30/2016  . Nonallopathic lesion of rib cage 06/30/2016  . Nonallopathic  lesion of cervical region 06/30/2016  . Scapular dyskinesis 04/29/2016  . Subacromial bursitis 04/29/2016  . Substance abuse in remission ([place]) 03/28/2015  . Headache(784.0) 12/14/2013  . OCP (oral contraceptive pills) initiation 08/15/2013  . Smoker 02/13/2013  . Depression 12/05/2012  . Tobacco user 08/19/2012    Small amount   . Exercise-induced asthma 04/03/2011   Social History   Occupational History  . Occupation: coffee shop   Tobacco Use  . Smoking status: Former Smoker    Packs/day: 0.50    Years: 4.00    Pack years: 2.00    Types: Cigarettes  . Smokeless tobacco: Never Used  Substance and Sexual Activity  . 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 Drug use: Yes    Types: Marijuana    Comment: occasionally  . Sexual activity: Not Currently    Partners: Female    Birth control/protection: Condom, OCP, Pill   Social History   Social History Narrative   Legal Guardians: [place]   Lives with rrommate works  [employer]  60 - 30 hours per week     OBJECTIVE:  VS:  HT:5\' 1"  (154.9 cm)   WT:   BMI:     BP:118/80  HR:76bpm  TEMP: ( )  RESP:97 %  PHYSICAL EXAM: Adult female. No acute distress.  Alert and appropriate. Left foot is well aligned.  She does have increased splay toe between her first and second toes.  She has good range of motion of her great toe.  She does have a small palpable indentation of the plantar plate directly over the second metatarsal head.  Slight pain with metatarsal squeeze but this is minimal.  She has a negative/normal Mulder's click.  No focal pain directly over the metatarsal shafts.  Her shoes show significant wear and breakdown of the mid sole of the Birkenstocks.  The cork is compressing.  She has almost completely worn through the out sole with a rocker-bottom deformity to her shoe.     ASSESSMENT:   1. Metatarsalgia of left foot   2. Loss of transverse plantar arch of left foot     PROCEDURES:  None  PLAN:  Pertinent additional documentation may be included in corresponding procedure notes, imaging studies, problem based documentation and patient instructions.  No problem-specific Assessment & Plan notes found for this encounter.   She effectively has a contusion of the metatarsal plate increasing her activity and from wearing worn-out shoes.  Would like for her to add over-the-counter sole arch supports for her exercise shoes and recommend that she discard her current pair Birkenstocks and replace these with a new pair.  Cool water soaking recommended  Continue with previously prescribed ibuprofen twice a day at minimum for the next 2 weeks.  If any lack of improvement she will plan to follow-up for repeat evaluation for several weeks of this treatment.  Otherwise as needed.  Links to [employer] provided today per Patient Instructions.  These exercises were developed by Minerva Ends, DC with a strong emphasis on core neuromuscular reducation and postural realignment through body-weight exercises.  This will help allow her to remain active without causing significant exacerbations and other symptoms.   Cool water soaking recommended per AVS.  Activity modifications and the importance of avoiding exacerbating activities (limiting pain to no more than a 4 / 10 during or following activity) recommended and discussed.   Discussed red flag symptoms that warrant earlier emergent evaluation and patient voices understanding.    No orders of the defined types were placed in this encounter.  Lab Orders  No laboratory test(s) ordered today   Imaging Orders  No imaging studies ordered today   Referral Orders  No referral(s) requested today    Return if symptoms worsen or fail to improve.          Gerda Diss, [facility] Physician

## 2018-09-19 NOTE — Patient Instructions (Addendum)
It was great to see you today.     Frequent icing is recommended.  You can ice for 10-15 minutes at a time 3-4 times per day if needed.  It is important to be sure to ice immediately after activity. For the foot/ankle and hand/wrist it is sometimes easier and more effective to soak in a bucket of cool water.   The water should not be miserably cold, a good rule to go by his if there is any ice floating by the end of the 10-15 minutes it was likely too cold.   I recommend that you obtain over-the-counter SOLE  medium cushioned insoles.  These can be found at [employer] - or on-line at [employer]  Search for "SOLE Active Medium Shoe Insoles"   Get a new pair of Birkenstocks     Also check out [employer]" which is a program developed by Dr. Minerva Ends.   There are links to a couple of his YouTube Videos below and I would like to see you performing one of his videos 5-6 days per week.  It is best to do these exercises first thing in the morning.  They will give you a good jumpstart here today and start normalizing the way you move.  A good intro video is: "Independence from Pain 7-minute Video" - travelstabloid.com   A more advanced video is: Interior and spatial designer original 12 minutes" - https://www.king-greer.com/  Exercises that focus more on the neck are as below: Dr. Archie Balboa with [facility] teaching neck and shoulder details Part 1 - https://youtu.be/[email or web address] Part 2 Dr. Archie Balboa with [facility] quick routine to practice daily - https://youtu.be/[email or web address]  Do not try to attempt the entire video when first beginning.  Try breaking of each exercise that he goes into shorter segments.  In other words, if they perform an exercise for 45 seconds, start with 15 seconds and rest and then resume when they begin the new activity.  If you work your way up to being able to do these videos without having to stop, I expect you  will see significant improvements in your pain.  If you enjoy his videos and would like to find out more you can look on his website: [email or web address]  He has a workout streaming option as well as a DVD set available for purchase.  Amazon has the best price for his DVDs.

## 2018-11-17 ENCOUNTER — Telehealth: Payer: 59 | Admitting: Family

## 2018-11-17 ENCOUNTER — Emergency Department (HOSPITAL_COMMUNITY)
Admission: EM | Admit: 2018-11-17 | Discharge: 2018-11-17 | Disposition: A | Discharge status: Home / Self Care | Payer: 59 | Attending: Emergency Medicine | Admitting: Emergency Medicine

## 2018-11-17 ENCOUNTER — Encounter (HOSPITAL_COMMUNITY): Payer: Self-pay | Admitting: Obstetrics and Gynecology

## 2018-11-17 ENCOUNTER — Ambulatory Visit: Payer: Self-pay

## 2018-11-17 ENCOUNTER — Other Ambulatory Visit: Payer: Self-pay

## 2018-11-17 ENCOUNTER — Emergency Department (HOSPITAL_COMMUNITY): Payer: 59

## 2018-11-17 DIAGNOSIS — R059 Cough, unspecified: Secondary | ICD-10-CM

## 2018-11-17 DIAGNOSIS — R05 Cough: Secondary | ICD-10-CM

## 2018-11-17 DIAGNOSIS — R0981 Nasal congestion: Secondary | ICD-10-CM | POA: Insufficient documentation

## 2018-11-17 DIAGNOSIS — Z79899 Other long term (current) drug therapy: Secondary | ICD-10-CM | POA: Insufficient documentation

## 2018-11-17 DIAGNOSIS — R0600 Dyspnea, unspecified: Secondary | ICD-10-CM | POA: Diagnosis not present

## 2018-11-17 DIAGNOSIS — J029 Acute pharyngitis, unspecified: Secondary | ICD-10-CM | POA: Diagnosis not present

## 2018-11-17 DIAGNOSIS — R110 Nausea: Secondary | ICD-10-CM | POA: Insufficient documentation

## 2018-11-17 DIAGNOSIS — R197 Diarrhea, unspecified: Secondary | ICD-10-CM | POA: Diagnosis not present

## 2018-11-17 DIAGNOSIS — F17200 Nicotine dependence, unspecified, uncomplicated: Secondary | ICD-10-CM | POA: Insufficient documentation

## 2018-11-17 DIAGNOSIS — J45909 Unspecified asthma, uncomplicated: Secondary | ICD-10-CM | POA: Diagnosis not present

## 2018-11-17 MED ORDER — PROMETHAZINE-DM 6.25-15 MG/5ML PO SYRP: 5.0000 mL | ORAL_SOLUTION | Freq: Four times a day (QID) | ORAL | 0 refills | Status: DC | PRN

## 2018-11-17 MED ORDER — ALBUTEROL SULFATE HFA 108 (90 BASE) MCG/ACT IN AERS
2.0000 | INHALATION_SPRAY | Freq: Once | RESPIRATORY_TRACT | Status: AC
  Administered 2018-11-17: 2 via RESPIRATORY_TRACT
  Filled 2018-11-17: qty 6.7

## 2018-11-17 NOTE — ED Triage Notes (Signed)
Pt reports she works at the [employer] and was working with a Engineer, structural for multiple shifts and he recently tested positive for Covid-19. Pt reports she is having SOB and chest pain and she has a hx of asthma. Pt reports she has a dry cough at this time. Pt reports Nausea and diarrhea with one episode of emesis 4 days ago.

## 2018-11-17 NOTE — ED Notes (Signed)
Discharge paperwork reviewed with pt, inhaler provided for home use.  Pt with no questions at this time, ambulatory at discharge.

## 2018-11-17 NOTE — Progress Notes (Signed)
Good afternoon Lysbeth Galas, I see that you were seen and treated in the Emergency Department today. Please follow the recommendations of the ER provider. Feel better soon!

## 2018-11-17 NOTE — Telephone Encounter (Signed)
Pt called stating that she woke today with chest pain like she broke a rib She has a dry cough. She feels SOB. She has had a  exposure to Covid-19 positive person.  She states she was in the same room for approximately 2 hours.  She has Hx of asthma and she states that she vaps. She states she has no fever.  She has a headache that she states may be her migraine. Per protocol pt will go to Er for evaluation of symptoms. Care advice read to patient. Pt verbalized understanding of all instructions. Report called th Becky Sax nurse [facility].  Reason for Disposition . [1] Difficulty breathing occurs AND [2] within 14 days of COVID-19 EXPOSURE (Close Contact)  Answer Assessment - Initial Assessment Questions 1. CLOSE CONTACT: "Who is the person with the confirmed or suspected COVID-19 infection that you were exposed to?"     Dose not want to say 2. PLACE of CONTACT: "Where were you when you were exposed to COVID-19?" (e.g., home, school, medical waiting room; which city?)     [place] 3. TYPE of CONTACT: "How much contact was there?" (e.g., sitting next to, live in same house, work in same office, same building)    Same building same [place] 4. DURATION of CONTACT: "How long were you in contact with the COVID-19 patient?" (e.g., a few seconds, passed by person, a few minutes, live with the patient)     2hours 5. DATE of CONTACT: "When did you have contact with a COVID-19 patient?" (e.g., how many days ago)     31 through the 2 nd 6. TRAVEL: "Have you traveled out of the country recently?" If so, "When and where?"     * Also ask about out-of-state travel, since the CDC has identified some high risk cities for community spread in the [place].     * Note: Travel becomes less relevant if there is widespread community transmission where the patient lives.    no 7. COMMUNITY SPREAD: "Are there lots of cases or COVID-19 (community spread) where you live?" (See public health department website, if  unsure)   * MAJOR community spread: high number of cases; numbers of cases are increasing; many people hospitalized.   * MINOR community spread: low number of cases; not increasing; few or no people hospitalized    [place] 8. SYMPTOMS: "Do you have any symptoms?" (e.g., fever, cough, breathing difficulty)     Cough chestpain nausea diarrhea 9. PREGNANCY OR POSTPARTUM: "Is there any chance you are pregnant?" "When was your last menstrual period?" "Did you deliver in the last 2 weeks?"    No 10. HIGH RISK: "Do you have any heart or lung problems? Do you have a weak immune system?" (e.g., CHF, COPD, asthma, HIV positive, chemotherapy, renal failure, diabetes mellitus, sickle cell anemia)       Asthma vap  Protocols used: CORONAVIRUS (COVID-19) EXPOSURE-A-AH

## 2018-11-17 NOTE — Discharge Instructions (Addendum)
Candace Shaw was evaluated in Emergency Department on 11/17/2018 for the symptoms described in the history of present illness. She was evaluated in the context of the global COVID-19 pandemic, which necessitated consideration that the patient might be at risk for infection with the SARS-CoV-2 virus that causes COVID-19. Institutional protocols and algorithms that pertain to the evaluation of patients at risk for COVID-19 are in a state of rapid change based on information released by regulatory bodies including the CDC and federal and state organizations. These policies and algorithms were followed during the patient's care in the ED.     Person Under Monitoring Name: Candace Shaw  Location: [place] [address]   Infection Prevention Recommendations for Individuals Confirmed to have, or Being Evaluated for, 2019 Novel Coronavirus (COVID-19) Infection Who Receive Care at Home  Individuals who are confirmed to have, or are being evaluated for, COVID-19 should follow the prevention steps below until a healthcare provider or local or state health department says they can return to normal activities.  Stay home except to get medical care You should restrict activities outside your home, except for getting medical care. Do not go to work, school, or public areas, and do not use public transportation or taxis.  Call ahead before visiting your doctor Before your medical appointment, call the healthcare provider and tell them that you have, or are being evaluated for, COVID-19 infection. This will help the healthcare providers office take steps to keep other people from getting infected. Ask your healthcare provider to call the local or state health department.  Monitor your symptoms Seek prompt medical attention if your illness is worsening (e.g., difficulty breathing). Before going to your medical appointment, call the healthcare provider and tell them that  you have, or are being evaluated for, COVID-19 infection. Ask your healthcare provider to call the local or state health department.  Wear a facemask You should wear a facemask that covers your nose and mouth when you are in the same room with other people and when you visit a healthcare provider. People who live with or visit you should also wear a facemask while they are in the same room with you.  Separate yourself from other people in your home As much as possible, you should stay in a different room from other people in your home. Also, you should use a separate bathroom, if available.  Avoid sharing household items You should not share dishes, drinking glasses, cups, eating utensils, towels, bedding, or other items with other people in your home. After using these items, you should wash them thoroughly with soap and water.  Cover your coughs and sneezes Cover your mouth and nose with a tissue when you cough or sneeze, or you can cough or sneeze into your sleeve. Throw used tissues in a lined trash can, and immediately wash your hands with soap and water for at least 20 seconds or use an alcohol-based hand rub.  Wash your [facility] your hands often and thoroughly with soap and water for at least 20 seconds. You can use an alcohol-based hand sanitizer if soap and water are not available and if your hands are not visibly dirty. Avoid touching your eyes, nose, and mouth with unwashed hands.   Prevention Steps for Caregivers and Household Members of Individuals Confirmed to have, or Being Evaluated for, COVID-19 Infection Being Cared for in the Home  If you live with, or provide care at home for, a person confirmed  to have, or being evaluated for, COVID-19 infection please follow these guidelines to prevent infection:  Follow healthcare providers instructions Make sure that you understand and can help the patient follow any healthcare provider instructions for all  care.  Provide for the patients basic needs You should help the patient with basic needs in the home and provide support for getting groceries, prescriptions, and other personal needs.  Monitor the patients symptoms If they are getting sicker, call his or her medical provider and tell them that the patient has, or is being evaluated for, COVID-19 infection. This will help the healthcare providers office take steps to keep other people from getting infected. Ask the healthcare provider to call the local or state health department.  Limit the number of people who have contact with the patient If possible, have only one caregiver for the patient. Other household members should stay in another home or place of residence. If this is not possible, they should stay in another room, or be separated from the patient as much as possible. Use a separate bathroom, if available. Restrict visitors who do not have an essential need to be in the home.  Keep older adults, very young children, and other sick people away from the patient Keep older adults, very young children, and those who have compromised immune systems or chronic health conditions away from the patient. This includes people with chronic heart, lung, or kidney conditions, diabetes, and cancer.  Ensure good ventilation Make sure that shared spaces in the home have good air flow, such as from an air conditioner or an opened window, weather permitting.  Wash your hands often Wash your hands often and thoroughly with soap and water for at least 20 seconds. You can use an alcohol based hand sanitizer if soap and water are not available and if your hands are not visibly dirty. Avoid touching your eyes, nose, and mouth with unwashed hands. Use disposable paper towels to dry your hands. If not available, use dedicated cloth towels and replace them when they become wet.  Wear a facemask and gloves Wear a disposable facemask at all times in  the room and gloves when you touch or have contact with the patients blood, body fluids, and/or secretions or excretions, such as sweat, saliva, sputum, nasal mucus, vomit, urine, or feces.  Ensure the mask fits over your nose and mouth tightly, and do not touch it during use. Throw out disposable facemasks and gloves after using them. Do not reuse. Wash your hands immediately after removing your facemask and gloves. If your personal clothing becomes contaminated, carefully remove clothing and launder. Wash your hands after handling contaminated clothing. Place all used disposable facemasks, gloves, and other waste in a lined container before disposing them with other household waste. Remove gloves and wash your hands immediately after handling these items.  Do not share dishes, glasses, or other household items with the patient Avoid sharing household items. You should not share dishes, drinking glasses, cups, eating utensils, towels, bedding, or other items with a patient who is confirmed to have, or being evaluated for, COVID-19 infection. After the person uses these items, you should wash them thoroughly with soap and water.  Wash laundry thoroughly Immediately remove and wash clothes or bedding that have blood, body fluids, and/or secretions or excretions, such as sweat, saliva, sputum, nasal mucus, vomit, urine, or feces, on them. Wear gloves when handling laundry from the patient. Read and follow directions on labels of laundry or clothing items  and detergent. In general, wash and dry with the warmest temperatures recommended on the label.  Clean all areas the individual has used often Clean all touchable surfaces, such as counters, tabletops, doorknobs, bathroom fixtures, toilets, phones, keyboards, tablets, and bedside tables, every day. Also, clean any surfaces that may have blood, body fluids, and/or secretions or excretions on them. Wear gloves when cleaning surfaces the patient has  come in contact with. Use a diluted bleach solution (e.g., dilute bleach with 1 part bleach and 10 parts water) or a household disinfectant with a label that says EPA-registered for coronaviruses. To make a bleach solution at home, add 1 tablespoon of bleach to 1 quart (4 cups) of water. For a larger supply, add  cup of bleach to 1 gallon (16 cups) of water. Read labels of cleaning products and follow recommendations provided on product labels. Labels contain instructions for safe and effective use of the cleaning product including precautions you should take when applying the product, such as wearing gloves or eye protection and making sure you have good ventilation during use of the product. Remove gloves and wash hands immediately after cleaning.  Monitor yourself for signs and symptoms of illness Caregivers and household members are considered close contacts, should monitor their health, and will be asked to limit movement outside of the home to the extent possible. Follow the monitoring steps for close contacts listed on the symptom monitoring form.   ? If you have additional questions, contact your local health department or call the epidemiologist on call at [phone] (available 24/7). ? This guidance is subject to change. For the most up-to-date guidance from CDC, please refer to their website: YouBlogs.pl   Persons with COVID-19 who have symptoms and were directed to care for themselves at home may discontinue isolation under the following conditions: At least 3 days (72 hours) have passed since recovery defined as resolution of fever without the use of fever-reducing medications and Improvement in respiratory symptoms (e.g., cough, shortness of breath); and, At least 7 days have passed since symptoms first appeared.

## 2018-11-17 NOTE — ED Provider Notes (Addendum)
[place] DEPT Provider Note   CSN: [id number] Arrival date & time: 11/17/18  1309    History   Chief Complaint Chief Complaint  Patient presents with  . Chest Pain  . Shortness of Breath    HPI Candace Shaw is a 23 y.o. female.     23 year old female presents with cough and congestion times several days.  Have history of asthma and does use tobacco as well as late.  Has had positive exposure to individual who tested positive for CO VID-19.  States that she is had some dyspnea but that her cough is not been productive.  Has had some watery diarrhea with nausea but no vomiting.  Denies any fever.  Scratchy throat without ear pain.  No rashes.  No neck pain or photophobia.  No treatment used prior to arrival     Past Medical History:  Diagnosis Date  . Asthma   . Bipolar disorder ([place])   . Depression   . Hallucinogen dependence, unspecified 10/03/2012  . Headache(784.0)   . Obesity   . Personality disorder [facility])     Patient Active Problem List   Diagnosis Date Noted  . Snake bite 02/16/2018  . Acute lymphadenitis of lower extremity 02/16/2018  . Nonallopathic lesion of thoracic region 06/30/2016  . Nonallopathic lesion of rib cage 06/30/2016  . Nonallopathic lesion of cervical region 06/30/2016  . Scapular dyskinesis 04/29/2016  . Subacromial bursitis 04/29/2016  . Substance abuse in remission ([place]) 03/28/2015  . Headache(784.0) 12/14/2013  . OCP (oral contraceptive pills) initiation 08/15/2013  . Smoker 02/13/2013  . Depression 12/05/2012  . Tobacco user 08/19/2012  . Exercise-induced asthma 04/03/2011    Past Surgical History:  Procedure Laterality Date  . WISDOM TOOTH EXTRACTION  Age 21     OB History    Gravida      Para      Term      Preterm      AB      Living  0     SAB      TAB      Ectopic      Multiple      Live Births               Home Medications    Prior to Admission  medications   Medication Sig Start Date End Date Taking? Authorizing Provider  Cariprazine HCl (VRAYLAR) 6 MG CAPS Take 1 capsule by mouth daily.    [provider]  ibuprofen (ADVIL,MOTRIN) 800 MG tablet Take 1 tablet (800 mg total) by mouth every 8 (eight) hours as needed. 02/16/18   Gerda Diss, DO  lamoTRIgine (LAMICTAL) 100 MG tablet Take 100 mg by mouth daily.    [provider]  lisdexamfetamine (VYVANSE) 20 MG capsule Take 20 mg by mouth daily.    [provider]  traZODone (DESYREL) 50 MG tablet Take 1 tablet by mouth at bedtime as needed. 08/23/18   [provider]  vortioxetine HBr (TRINTELLIX) 5 MG TABS tablet Take 5 mg by mouth daily.    [provider]    Family History Family History  Adopted: Yes  Problem Relation Age of Onset  . Alcohol abuse Mother   . Alcohol abuse Father     Social History Social History   Tobacco Use  . Smoking status: Former Smoker    Packs/day: 0.50    Years: 4.00    Pack years: 2.00    Types:  Cigarettes  . Smokeless tobacco: Never Used  Substance Use Topics  . 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 Drug use: Yes    Types: Marijuana    Comment: occasionally     Allergies   Zolpidem tartrate   Review of Systems Review of Systems  All other systems reviewed and are negative.    Physical Exam Updated Vital Signs BP 114/73 (BP Location: Right Arm)   Pulse 80   Temp 98.1 F (36.7 C) (Oral)   Resp 19   Ht 1.549 m (5\' 1" )   Wt 74.8 kg   LMP 11/14/2018 (Exact Date)   SpO2 99%   BMI 31.18 kg/m   Physical Exam Vitals signs and nursing note reviewed.  Constitutional:      General: She is not in acute distress.    Appearance: Normal appearance. She is well-developed. She is not toxic-appearing.  HENT:     Head: Normocephalic and atraumatic.  Eyes:     General: Lids are normal.     Conjunctiva/sclera:  Conjunctivae normal.     Pupils: Pupils are equal, round, and reactive to light.  Neck:     Musculoskeletal: Normal range of motion and neck supple.     Thyroid: No thyroid mass.     Trachea: No tracheal deviation.  Cardiovascular:     Rate and Rhythm: Normal rate and regular rhythm.     Heart sounds: Normal heart sounds. No murmur. No gallop.   Pulmonary:     Effort: Pulmonary effort is normal. No respiratory distress.     Breath sounds: Normal breath sounds. No stridor. No decreased breath sounds, wheezing, rhonchi or rales.  Abdominal:     General: Bowel sounds are normal. There is no distension.     Palpations: Abdomen is soft.     Tenderness: There is no abdominal tenderness. There is no rebound.  Musculoskeletal: Normal range of motion.        General: No tenderness.  Skin:    General: Skin is warm and dry.     Findings: No abrasion or rash.  Neurological:     Mental Status: She is alert and oriented to person, place, and time.     GCS: GCS eye subscore is 4. GCS verbal subscore is 5. GCS motor subscore is 6.     Cranial Nerves: No cranial nerve deficit.     Sensory: No sensory deficit.  Psychiatric:        Speech: Speech normal.        Behavior: Behavior normal.      ED Treatments / Results  Labs (all labs ordered are listed, but only abnormal results are displayed) Labs Reviewed - No data to display  EKG None  Radiology No results found.  Procedures Procedures (including critical care time)  Medications Ordered in ED Medications  albuterol (PROVENTIL HFA;VENTOLIN HFA) 108 (90 Base) MCG/ACT inhaler 2 puff (has no administration in time range)     Initial Impression / Assessment and Plan / ED Course  I have reviewed the triage vital signs and the nursing notes.  Pertinent labs & imaging results that were available during my care of the patient were reviewed by me and considered in my medical decision making (see chart for details).        Patient  given 2 puffs albuterol and does feel better.  Chest x-ray without acute findings.  Patient will be sent home  with CO VID precautions  Candace Shaw was evaluated in Emergency Department on 11/17/2018 for the symptoms described in the history of present illness. She was evaluated in the context of the global COVID-19 pandemic, which necessitated consideration that the patient might be at risk for infection with the SARS-CoV-2 virus that causes COVID-19. Institutional protocols and algorithms that pertain to the evaluation of patients at risk for COVID-19 are in a state of rapid change based on information released by regulatory bodies including the CDC and federal and state organizations. These policies and algorithms were followed during the patient's care in the ED.   Final Clinical Impressions(s) / ED Diagnoses   Final diagnoses:  None    ED Discharge Orders    None       Lacretia Leigh, MD 11/17/18 1440    Lacretia Leigh, MD 11/17/18 1442

## 2018-11-20 NOTE — Telephone Encounter (Signed)
FYI.  Patient went to the ER 11/17/2018

## 2019-01-12 ENCOUNTER — Other Ambulatory Visit: Payer: Self-pay | Admitting: *Deleted

## 2019-01-12 DIAGNOSIS — Z20822 Contact with and (suspected) exposure to covid-19: Secondary | ICD-10-CM

## 2019-01-15 ENCOUNTER — Telehealth: Payer: Self-pay | Admitting: *Deleted

## 2019-01-15 LAB — NOVEL CORONAVIRUS, NAA: SARS-CoV-2, NAA: NOT DETECTED

## 2019-01-15 NOTE — Telephone Encounter (Signed)
Patient is calling to get her results- patient has been notified of her results- but she has concerns about exposure after being tested. 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 Patient would like to get tested again if possible. Note sent for PCP review.  Patient is asymptomatic at this time.

## 2019-01-16 NOTE — Telephone Encounter (Signed)
Advise virtual visit

## 2019-01-16 NOTE — Progress Notes (Signed)
Virtual Visit via Video Note  I connected with@ on 01/17/19 at 12:30 PM EDT by a video enabled telemedicine application and verified that I am speaking with the correct person using two identifiers. Location patient: in car  Location provider:work  office Persons participating in the virtual visit: patient, provider  [place]th national recommendations  regarding COVID 19 pandemic   video visit is advised over in office visit for this patient.  Patient aware  of the limitations of evaluation and management by telemedicine and  availability of in person appointments. and agreed to proceed.   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  She has no  sx of covid infection. ROS: See pertinent positives and negatives per HPI.  Past Medical History:  Diagnosis Date  . Asthma   . Bipolar disorder ([place])   . Depression   . Hallucinogen dependence, unspecified 10/03/2012  . Headache(784.0)   . Obesity   . Personality disorder [facility])     Past Surgical History:  Procedure Laterality Date  . WISDOM TOOTH EXTRACTION  Age 23    Family History  Adopted: Yes  Problem Relation Age of Onset  . Alcohol abuse Mother   . Alcohol abuse Father     Social History   Tobacco Use  . Smoking status: Former Smoker    Packs/day: 0.50    Years: 4.00    Pack years: 2.00    Types: Cigarettes  . Smokeless tobacco: Never Used  Substance Use Topics  . 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 Drug use: Yes    Types: Marijuana    Comment: occasionally      Current Outpatient Medications:  .  ibuprofen (ADVIL,MOTRIN)  800 MG tablet, Take 1 tablet (800 mg total) by mouth every 8 (eight) hours as needed., Disp: 60 tablet, Rfl: 1 .  lamoTRIgine (LAMICTAL) 200 MG tablet, Take 200 mg by mouth daily., Disp: , Rfl:  .  lisdexamfetamine (VYVANSE) 20 MG capsule, Take 20 mg by mouth daily., Disp: , Rfl:  .  promethazine-dextromethorphan (PROMETHAZINE-DM) 6.25-15 MG/5ML syrup, Take 5 mLs by mouth 4 (four) times daily as needed., Disp: 118 mL, Rfl: 0 .  temazepam (RESTORIL) 15 MG capsule, Take 15 mg by mouth daily., Disp: , Rfl:  .  vortioxetine HBr (TRINTELLIX) 5 MG TABS tablet, Take 5 mg by mouth daily., Disp: , Rfl:  .  VRAYLAR capsule, Take 1.5 mg by mouth daily., Disp: , Rfl:   EXAM: BP Readings from Last 3 Encounters:  11/17/23 130/68  09/19/18 118/80  09/01/18 122/80    VITALS na   GENERAL: alert, oriented, appears well and in no acute distress in car  For visit   HEENT: atraumatic, conjunttiva clear, no obvious abnormalities on inspection of external nose and ears  NECK: normal movements of the head and neck  LUNGS: on inspection no signs of respiratory distress, breathing rate appears normal, no obvious gross SOB, gasping or wheezing  PSYCH/NEURO: pleasant  and cooperative, no obvious depression or anxiety, speech and thought processing grossly intact   ASSESSMENT AND PLAN:  Discussed the following assessment and plan:  Close Exposure to Covid-19 Virus Agree with testing again   And pat aware of  Measures to  Limit spread but logistical problems  And should avoid limit  exposure to high risk  Until known negative  Uncertain of tat but  Usually days  Counseled.   Expectant management and discussion of plan and treatment with opportunity to ask questions and all were answered. The patient agreed with the plan and demonstrated an understanding of the instructions.   Advised to call back or seek an in-person evaluation if worsening  or having  further concerns . In interim    Shanon Ace, MD

## 2019-01-16 NOTE — Telephone Encounter (Signed)
Virtual visit has been scheduled for tomorrow

## 2019-01-17 ENCOUNTER — Other Ambulatory Visit: Payer: Self-pay

## 2019-01-17 ENCOUNTER — Encounter: Payer: Self-pay | Admitting: Internal Medicine

## 2019-01-17 ENCOUNTER — Telehealth: Payer: Self-pay | Admitting: *Deleted

## 2019-01-17 ENCOUNTER — Ambulatory Visit (INDEPENDENT_AMBULATORY_CARE_PROVIDER_SITE_OTHER): Payer: 59 | Admitting: Internal Medicine

## 2019-01-17 DIAGNOSIS — Z20822 Contact with and (suspected) exposure to covid-19: Secondary | ICD-10-CM

## 2019-01-17 DIAGNOSIS — Z20828 Contact with and (suspected) exposure to other viral communicable diseases: Secondary | ICD-10-CM | POA: Diagnosis not present

## 2019-01-17 NOTE — Telephone Encounter (Addendum)
Contacted pt to schedule COVID testing per Dr Regis Bill; pt offered and accepted appointment at [facility] site 01/17/2019 at 1430; pt given address, directions, and instructions that she and all occupants of her vehicle should wear masks; she verbalized understanding; orders placed per protocol.

## 2019-01-19 LAB — NOVEL CORONAVIRUS, NAA: SARS-CoV-2, NAA: NOT DETECTED

## 2019-01-26 ENCOUNTER — Ambulatory Visit: Payer: Self-pay | Admitting: *Deleted

## 2019-01-26 ENCOUNTER — Emergency Department (HOSPITAL_COMMUNITY): Payer: 59

## 2019-01-26 ENCOUNTER — Emergency Department (HOSPITAL_COMMUNITY)
Admission: EM | Admit: 2019-01-26 | Discharge: 2019-01-26 | Disposition: A | Discharge status: Home / Self Care | Payer: 59 | Attending: Emergency Medicine | Admitting: Emergency Medicine

## 2019-01-26 ENCOUNTER — Other Ambulatory Visit: Payer: Self-pay

## 2019-01-26 ENCOUNTER — Encounter (HOSPITAL_COMMUNITY): Payer: Self-pay | Admitting: Emergency Medicine

## 2019-01-26 DIAGNOSIS — R079 Chest pain, unspecified: Secondary | ICD-10-CM

## 2019-01-26 DIAGNOSIS — Z87891 Personal history of nicotine dependence: Secondary | ICD-10-CM | POA: Diagnosis not present

## 2019-01-26 DIAGNOSIS — J45990 Exercise induced bronchospasm: Secondary | ICD-10-CM | POA: Diagnosis not present

## 2019-01-26 DIAGNOSIS — Z79899 Other long term (current) drug therapy: Secondary | ICD-10-CM | POA: Diagnosis not present

## 2019-01-26 DIAGNOSIS — R0789 Other chest pain: Secondary | ICD-10-CM | POA: Diagnosis not present

## 2019-01-26 LAB — CBC
HCT: 42.5 % (ref 36.0–46.0)
Hemoglobin: 14.5 g/dL (ref 12.0–15.0)
MCH: 29.8 pg (ref 26.0–34.0)
MCHC: 34.1 g/dL (ref 30.0–36.0)
MCV: 87.4 fL (ref 80.0–100.0)
Platelets: 365 10*3/uL (ref 150–400)
RBC: 4.86 MIL/uL (ref 3.87–5.11)
RDW: 11.7 % (ref 11.5–15.5)
WBC: 7.3 10*3/uL (ref 4.0–10.5)
nRBC: 0 % (ref 0.0–0.2)

## 2019-01-26 LAB — BASIC METABOLIC PANEL
Anion gap: 11 (ref 5–15)
BUN: 10 mg/dL (ref 6–20)
CO2: 23 mmol/L (ref 22–32)
Calcium: 9.5 mg/dL (ref 8.9–10.3)
Chloride: 104 mmol/L (ref 98–111)
Creatinine, Ser: 0.66 mg/dL (ref 0.44–1.00)
GFR calc Af Amer: >60 mL/min (ref >60)
GFR calc non Af Amer: >60 mL/min (ref >60)
Glucose, Bld: 89 mg/dL (ref 70–99)
Potassium: 3.6 mmol/L (ref 3.5–5.1)
Sodium: 138 mmol/L (ref 135–145)

## 2019-01-26 LAB — I-STAT BETA HCG BLOOD, ED (MC, WL, AP ONLY): I-stat hCG, quantitative: <5 m[IU]/mL (ref <5)

## 2019-01-26 LAB — TROPONIN I: Troponin I: <0.03 ng/mL (ref <0.03)

## 2019-01-26 MED ORDER — ACETAMINOPHEN 325 MG PO TABS
650.0000 mg | ORAL_TABLET | Freq: Once | ORAL | Status: AC
  Administered 2019-01-26: 650 mg via ORAL
  Filled 2019-01-26: qty 2

## 2019-01-26 MED ORDER — IBUPROFEN 200 MG PO TABS
400.0000 mg | ORAL_TABLET | Freq: Once | ORAL | Status: AC
  Administered 2019-01-26: 400 mg via ORAL
  Filled 2019-01-26: qty 2

## 2019-01-26 NOTE — ED Triage Notes (Signed)
Pt c/o central chest pains that have been constant since last night. Worse when lifting shoulders or looking down.

## 2019-01-26 NOTE — ED Provider Notes (Signed)
Signout from Dr. Thurnell Garbe.  23 year old female referred here from her primary care doctor for upper mid chest pain since yesterday. Physical Exam  BP 121/89 (BP Location: Right Arm)   Pulse 75   Temp 98.1 F (36.7 C) (Oral)   Resp 18   LMP 01/16/2019   SpO2 100%   Physical Exam  ED Course/Procedures     Procedures  MDM  Plan is to follow-up on lab work.  If troponin and labs unremarkable patient can be discharged to follow-up with her PCP.  Patient's labs unremarkable.  Will review with patient and anticipate discharge.       Hayden Rasmussen, MD 01/26/19 [phone]

## 2019-01-26 NOTE — Telephone Encounter (Signed)
Pt called with complaints of chest pain in combination with 4 days of consistent extremity numbness (feet and arms falling asleep); she has having migraines; she has been having hoarseness for 2 days; the pt says that moving in a certain position makes her "feel like someone is standing on me"; the pain has moved to her back and shoulder blades; it started on 01/26/2019 around 1600; she started taking Zofran and gabapentin  a month ago per her psychiatrist; recommendations made per nurse triage protocol; the pt verbalizes understanding but she would like to be seen in the office; spoke with Tammy at [facility], and she concurs with disposition; the pt verbalized understanding, and will proceed to the ED; will route to office for notification.    Reason for Disposition . Patient sounds very sick or weak to the triager  Answer Assessment - Initial Assessment Questions 1. LOCATION: "Where does it hurt?"       sternum 2. RADIATION: "Does the pain go anywhere else?" (e.g., into neck, jaw, arms, back)     Back and shoulder blades 3. ONSET: "When did the chest pain begin?" (Minutes, hours or days)      01/26/2019 at 1600 4. PATTERN "Does the pain come and go, or has it been constant since it started?"  "Does it get worse with exertion?"      Constant, worse with movement 5. DURATION: "How long does it last" (e.g., seconds, minutes, hours)    hours 6. SEVERITY: "How bad is the pain?"  (e.g., Scale 1-10; mild, moderate, or severe)    - MILD (1-3): doesn't interfere with normal activities     - MODERATE (4-7): interferes with normal activities or awakens from sleep    - SEVERE (8-10): excruciating pain, unable to do any normal activities      5-6 out of 10 7. CARDIAC RISK FACTORS: "Do you have any history of heart problems or risk factors for heart disease?" (e.g., prior heart attack, angina; high blood /pressure, diabetes, being overweight, high cholesterol, smoking, or strong family history of heart  disease)   Passed out last year, cause unknown; smoker x 6 yrs (last 1 month ago) and vapes (consistently throughout the day, all day, every day) 8. PULMONARY RISK FACTORS: "Do you have any history of lung disease?"  (e.g., blood clots in lung, asthma, emphysema, birth control pills)    asthma 9. CAUSE: "What do you think is causing the chest pain?"     No idea 10. OTHER SYMPTOMS: "Do you have any other symptoms?" (e.g., dizziness, nausea, vomiting, sweating, fever, difficulty breathing, cough)       Coughing more with vaping; already tested for COVID, freezing at night, diarrhea 11. PREGNANCY: "Is there any chance you are pregnant?" "When was your last menstrual period?"       No LMP 01/16/2019  Protocols used: CHEST PAIN-A-AH

## 2019-01-26 NOTE — ED Notes (Signed)
Bed: WTR5 Expected date:  Expected time:  Means of arrival:  Comments: 

## 2019-01-26 NOTE — ED Provider Notes (Signed)
[place] DEPT Provider Note   CSN: [id number] Arrival date & time: 01/26/19  1258     History   Chief Complaint Chief Complaint  Patient presents with  . Chest Pain    HPI Candace Shaw is a 23 y.o. female.     HPI  Pt was seen at 1330. Per pt, c/o gradual onset and persistence of constant upper mid-chest "pains" since last night. Pt states the pain worsens when she moves her shoulders. Denies injury, no SOB/cough, no palpitations, no rash, no fevers, no abd pain, no N/V/D.   Past Medical History:  Diagnosis Date  . Asthma   . Bipolar disorder ([place])   . Depression   . Hallucinogen dependence, unspecified 10/03/2012  . Headache(784.0)   . Obesity   . Personality disorder [facility])     Patient Active Problem List   Diagnosis Date Noted  . Snake bite 02/16/2018  . Acute lymphadenitis of lower extremity 02/16/2018  . Nonallopathic lesion of thoracic region 06/30/2016  . Nonallopathic lesion of rib cage 06/30/2016  . Nonallopathic lesion of cervical region 06/30/2016  . Scapular dyskinesis 04/29/2016  . Subacromial bursitis 04/29/2016  . Substance abuse in remission ([place]) 03/28/2015  . Headache(784.0) 12/14/2013  . OCP (oral contraceptive pills) initiation 08/15/2013  . Smoker 02/13/2013  . Depression 12/05/2012  . Tobacco user 08/19/2012  . Exercise-induced asthma 04/03/2011    Past Surgical History:  Procedure Laterality Date  . WISDOM TOOTH EXTRACTION  Age 64     OB History    Gravida      Para      Term      Preterm      AB      Living  0     SAB      TAB      Ectopic      Multiple      Live Births               Home Medications    Prior to Admission medications   Medication Sig Start Date End Date Taking? Authorizing Provider  ibuprofen (ADVIL,MOTRIN) 800 MG tablet Take 1 tablet (800 mg total) by mouth every 8 (eight) hours as needed. 02/16/18   Gerda Diss, DO  lamoTRIgine (LAMICTAL) 200  MG tablet Take 200 mg by mouth daily. 11/13/18   [provider]  lisdexamfetamine (VYVANSE) 20 MG capsule Take 20 mg by mouth daily.    [provider]  promethazine-dextromethorphan (PROMETHAZINE-DM) 6.25-15 MG/5ML syrup Take 5 mLs by mouth 4 (four) times daily as needed. 11/17/18   Dutch Quint B, FNP  temazepam (RESTORIL) 15 MG capsule Take 15 mg by mouth daily. 11/13/18   [provider]  vortioxetine HBr (TRINTELLIX) 5 MG TABS tablet Take 5 mg by mouth daily.    [provider]  VRAYLAR capsule Take 1.5 mg by mouth daily. 11/07/18   [provider]    Family History Family History  Adopted: Yes  Problem Relation Age of Onset  . Alcohol abuse Mother   . Alcohol abuse Father     Social History Social History   Tobacco Use  . Smoking status: Former Smoker    Packs/day: 0.50    Years: 4.00    Pack years: 2.00    Types: Cigarettes  . Smokeless tobacco: Never Used  Substance Use Topics  . 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  Drug use: Yes    Types: Marijuana    Comment: occasionally     Allergies   Zolpidem tartrate   Review of Systems Review of Systems ROS: Statement: All systems negative except as marked or noted in the HPI; Constitutional: Negative for fever and chills. ; ; Eyes: Negative for eye pain, redness and discharge. ; ; ENMT: Negative for ear pain, hoarseness, nasal congestion, sinus pressure and sore throat. ; ; Cardiovascular: Negative for palpitations, diaphoresis, dyspnea and peripheral edema. ; ; Respiratory: Negative for cough, wheezing and stridor. ; ; Gastrointestinal: Negative for nausea, vomiting, diarrhea, abdominal pain, blood in stool, hematemesis, jaundice and rectal bleeding. . ; ; Genitourinary: Negative for dysuria, flank pain and hematuria. ; ; Musculoskeletal: +CP. Negative for back pain and neck pain. Negative for swelling and trauma.; ;  Skin: Negative for pruritus, rash, abrasions, blisters, bruising and skin lesion.; ; Neuro: Negative for headache, lightheadedness and neck stiffness. Negative for weakness, altered level of consciousness, altered mental status, extremity weakness, paresthesias, involuntary movement, seizure and syncope.       Physical Exam Updated Vital Signs BP 121/89 (BP Location: Right Arm)   Pulse 75   Temp 98.1 F (36.7 C) (Oral)   Resp 18   LMP 01/16/2019   SpO2 100%   Physical Exam 1335: Physical examination:  Nursing notes reviewed; Vital signs and O2 SAT reviewed;  Constitutional: Well developed, Well nourished, Well hydrated, In no acute distress; Head:  Normocephalic, atraumatic; Eyes: EOMI, PERRL, No scleral icterus; ENMT: Mouth and pharynx normal, Mucous membranes moist; Neck: Supple, Full range of motion, No lymphadenopathy; Cardiovascular: Regular rate and rhythm, No gallop; Respiratory: Breath sounds clear & equal bilaterally, No wheezes.  Speaking full sentences with ease, Normal respiratory effort/excursion; Chest: +TTP R>L upper anterior parasternal areas, no rash, no deformity, no soft tissue crepitus. Movement normal; Abdomen: Soft, Nontender, Nondistended, Normal bowel sounds; Genitourinary: No CVA tenderness; Extremities: Peripheral pulses normal, No tenderness, No edema, No calf edema or asymmetry.; Neuro: AA&Ox3, Major CN grossly intact.  Speech clear. No gross focal motor or sensory deficits in extremities.; Skin: Color normal, Warm, Dry.   ED Treatments / Results  Labs (all labs ordered are listed, but only abnormal results are displayed)   EKG EKG Interpretation  Date/Time:  Friday January 26 2019 13:24:00 EDT Ventricular Rate:  82 PR Interval:    QRS Duration: 79 QT Interval:  356 QTC Calculation: 416 R Axis:   85 Text Interpretation:  Sinus rhythm When compared with ECG of 11/17/2018 No significant change was found Confirmed by Francine Graven [phone]) on 01/26/2019  1:41:07 PM   Radiology  Procedures Procedures (including critical care time)  Medications Ordered in ED Medications  ibuprofen (ADVIL) tablet 400 mg (has no administration in time range)  acetaminophen (TYLENOL) tablet 650 mg (has no administration in time range)     Initial Impression / Assessment and Plan / ED Course  I have reviewed the triage vital signs and the nursing notes.  Pertinent labs & imaging results that were available during my care of the patient were reviewed by me and considered in my medical decision making (see chart for details).     MDM Reviewed: previous chart, nursing note and vitals Reviewed previous: labs and ECG Interpretation: labs, ECG and x-ray    Results for orders placed or performed during the hospital encounter of 01/26/19  CBC  Result Value Ref Range   WBC 7.3 4.0 - 10.5 K/uL   RBC 4.86 3.87 -  5.11 MIL/uL   Hemoglobin 14.5 12.0 - 15.0 g/dL   HCT 42.5 36.0 - 46.0 %   MCV 87.4 80.0 - 100.0 fL   MCH 29.8 26.0 - 34.0 pg   MCHC 34.1 30.0 - 36.0 g/dL   RDW 11.7 11.5 - 15.5 %   Platelets 365 150 - 400 K/uL   nRBC 0.0 0.0 - 0.2 %  I-Stat beta hCG blood, ED  Result Value Ref Range   I-stat hCG, quantitative <5.0 <5 mIU/mL   Comment 3           Dg Chest 2 View Result Date: 01/26/2019 CLINICAL DATA:  Chest pain. EXAM: CHEST - 2 VIEW COMPARISON:  11/17/2018. FINDINGS: Mediastinum and hilar structures normal. Mild right base subsegmental atelectasis. No pleural effusion or pneumothorax. Heart size normal. Thoracic spine scoliosis and degenerative change. IMPRESSION: Mild right base subsegmental atelectasis. Electronically Signed   By: Marcello Moores  Register   On: 01/26/2019 14:34      1525:  Doubt PE as cause for symptoms with PERC negative. EKG unchanged from previous after 20+ hours of symptoms; troponin pending. Sign out to Dr. Melina Copa.        Final Clinical Impressions(s) / ED Diagnoses   Final diagnoses:  None    ED Discharge  Orders    None       Francine Graven, DO 01/26/19 1531

## 2019-01-26 NOTE — Discharge Instructions (Addendum)
You were seen in the emergency department for chest pain.  You had blood work chest x-ray and an EKG that did not show an obvious explanation for your pain.  You should try Tylenol and ibuprofen initially as this may be muscular.  Please follow-up with your doctor for further evaluation.  Return if any concerns.

## 2019-01-26 NOTE — Telephone Encounter (Signed)
Will monitor for ED arrival.  

## 2019-01-29 NOTE — Telephone Encounter (Signed)
Pt treated at [facility] ED.

## 2019-01-30 ENCOUNTER — Emergency Department (HOSPITAL_COMMUNITY)
Admission: EM | Admit: 2019-01-30 | Discharge: 2019-01-31 | Disposition: A | Discharge status: Home / Self Care | Payer: 59 | Attending: Emergency Medicine | Admitting: Emergency Medicine

## 2019-01-30 ENCOUNTER — Encounter (HOSPITAL_COMMUNITY): Payer: Self-pay | Admitting: Emergency Medicine

## 2019-01-30 DIAGNOSIS — F319 Bipolar disorder, unspecified: Secondary | ICD-10-CM | POA: Insufficient documentation

## 2019-01-30 DIAGNOSIS — Z79899 Other long term (current) drug therapy: Secondary | ICD-10-CM | POA: Diagnosis not present

## 2019-01-30 DIAGNOSIS — F10920 Alcohol use, unspecified with intoxication, uncomplicated: Secondary | ICD-10-CM

## 2019-01-30 DIAGNOSIS — Z87891 Personal history of nicotine dependence: Secondary | ICD-10-CM | POA: Diagnosis not present

## 2019-01-30 DIAGNOSIS — E876 Hypokalemia: Secondary | ICD-10-CM

## 2019-01-30 DIAGNOSIS — J45909 Unspecified asthma, uncomplicated: Secondary | ICD-10-CM | POA: Insufficient documentation

## 2019-01-30 DIAGNOSIS — Z0489 Encounter for examination and observation for other specified reasons: Secondary | ICD-10-CM | POA: Diagnosis present

## 2019-01-30 LAB — CBC
HCT: 36.4 % (ref 36.0–46.0)
Hemoglobin: 12.3 g/dL (ref 12.0–15.0)
MCH: 29.6 pg (ref 26.0–34.0)
MCHC: 33.8 g/dL (ref 30.0–36.0)
MCV: 87.5 fL (ref 80.0–100.0)
Platelets: 259 10*3/uL (ref 150–400)
RBC: 4.16 MIL/uL (ref 3.87–5.11)
RDW: 11.8 % (ref 11.5–15.5)
WBC: 8.7 10*3/uL (ref 4.0–10.5)
nRBC: 0 % (ref 0.0–0.2)

## 2019-01-30 NOTE — ED Notes (Signed)
Pt mother and father gave updated contact information, would like update when available.

## 2019-01-30 NOTE — ED Triage Notes (Signed)
BIB EMS from home. Per family pt has been in recovery from drugs/ETOH for few years. Recently started drinking again. Family called 911 over concern pt was having psychotic break. Pt agitated and inconsolable, was given 5 Versed and 5 Haldol. Pt placed in recliner chairs and is asleep at this time.

## 2019-01-31 LAB — COMPREHENSIVE METABOLIC PANEL
ALT: 16 U/L (ref 0–44)
AST: 21 U/L (ref 15–41)
Albumin: 4.1 g/dL (ref 3.5–5.0)
Alkaline Phosphatase: 49 U/L (ref 38–126)
Anion gap: 14 (ref 5–15)
BUN: 5 mg/dL — ABNORMAL LOW (ref 6–20)
CO2: 18 mmol/L — ABNORMAL LOW (ref 22–32)
Calcium: 8.8 mg/dL — ABNORMAL LOW (ref 8.9–10.3)
Chloride: 106 mmol/L (ref 98–111)
Creatinine, Ser: 0.6 mg/dL (ref 0.44–1.00)
GFR calc Af Amer: >60 mL/min (ref >60)
GFR calc non Af Amer: >60 mL/min (ref >60)
Glucose, Bld: 94 mg/dL (ref 70–99)
Potassium: 2.7 mmol/L — CL (ref 3.5–5.1)
Sodium: 138 mmol/L (ref 135–145)
Total Bilirubin: 0.4 mg/dL (ref 0.3–1.2)
Total Protein: 7 g/dL (ref 6.5–8.1)

## 2019-01-31 LAB — ETHANOL: Alcohol, Ethyl (B): 223 mg/dL — ABNORMAL HIGH (ref <10)

## 2019-01-31 LAB — I-STAT BETA HCG BLOOD, ED (MC, WL, AP ONLY): I-stat hCG, quantitative: <5 m[IU]/mL (ref <5)

## 2019-01-31 MED ORDER — MAGNESIUM SULFATE 2 GM/50ML IV SOLN
2.0000 g | Freq: Once | INTRAVENOUS | Status: AC
  Administered 2019-01-31: 2 g via INTRAVENOUS
  Filled 2019-01-31: qty 50

## 2019-01-31 MED ORDER — POTASSIUM CHLORIDE CRYS ER 20 MEQ PO TBCR
40.0000 meq | EXTENDED_RELEASE_TABLET | Freq: Once | ORAL | Status: AC
  Administered 2019-01-31: 40 meq via ORAL
  Filled 2019-01-31: qty 2

## 2019-01-31 MED ORDER — POTASSIUM CHLORIDE ER 20 MEQ PO TBCR: 20.0000 meq | EXTENDED_RELEASE_TABLET | Freq: Two times a day (BID) | ORAL | 0 refills | Status: DC

## 2019-01-31 MED ORDER — SODIUM CHLORIDE 0.9 % IV BOLUS
1000.0000 mL | Freq: Once | INTRAVENOUS | Status: AC
  Administered 2019-01-31: 1000 mL via INTRAVENOUS

## 2019-01-31 MED ORDER — POTASSIUM CHLORIDE 10 MEQ/100ML IV SOLN
10.0000 meq | Freq: Once | INTRAVENOUS | Status: AC
  Administered 2019-01-31: 10 meq via INTRAVENOUS
  Filled 2019-01-31: qty 100

## 2019-01-31 NOTE — ED Notes (Addendum)
Pt states that she drank 3 beers today and "a lot" of white claws, pt changed into purple scrubs

## 2019-01-31 NOTE — ED Notes (Signed)
Attempt to stand patient to change into scrubs. Pt cannot sit up on own without falling out of chair. Pt not changed at this time.

## 2019-01-31 NOTE — ED Notes (Signed)
PT reminded urine specimen is needed.

## 2019-01-31 NOTE — ED Notes (Signed)
Pt reports she went to rehab several years ago and last evening she wanted to see if she was an alcoholic and drank this evening. Pt reports she got extremely intoxicated and didn't realized she was that drunk.

## 2019-01-31 NOTE — ED Provider Notes (Signed)
[facility] EMERGENCY DEPARTMENT Provider Note   CSN: [id number] Arrival date & time: 01/30/19  2250    History   Chief Complaint Chief Complaint  Patient presents with  . Medical Clearance    HPI Candace Shaw is a 23 y.o. female.     HPI  This is a 23 year old female with a history of bipolar disorder, substance abuse who presents with agitation.  Per EMS, they were called for agitation.  Reportedly she was agitated and inconsolable.  She received 5 mg of Versed and 5 of Haldol by EMS.  Patient reports to me that she drank 3 drinks and a white call tonight.  She does have a history of alcohol abuse and has been in rehab.  She reports that this is her second time drinking and "I thought I can handle it."  She denies any suicidal or homicidal ideation.  She denies hearing any voices.  She does have a history of self cutting but states she has not cut recently.  She denies any other drug use tonight.  Does report recent history of marijuana use.  Past Medical History:  Diagnosis Date  . Asthma   . Bipolar disorder ([place])   . Depression   . Hallucinogen dependence, unspecified 10/03/2012  . Headache(784.0)   . Obesity   . Personality disorder [facility])     Patient Active Problem List   Diagnosis Date Noted  . Snake bite 02/16/2018  . Acute lymphadenitis of lower extremity 02/16/2018  . Nonallopathic lesion of thoracic region 06/30/2016  . Nonallopathic lesion of rib cage 06/30/2016  . Nonallopathic lesion of cervical region 06/30/2016  . Scapular dyskinesis 04/29/2016  . Subacromial bursitis 04/29/2016  . Substance abuse in remission ([place]) 03/28/2015  . Headache(784.0) 12/14/2013  . OCP (oral contraceptive pills) initiation 08/15/2013  . Smoker 02/13/2013  . Depression 12/05/2012  . Tobacco user 08/19/2012  . Exercise-induced asthma 04/03/2011    Past Surgical History:  Procedure Laterality Date  . WISDOM TOOTH EXTRACTION  Age 50     OB  History    Gravida      Para      Term      Preterm      AB      Living  0     SAB      TAB      Ectopic      Multiple      Live Births               Home Medications    Prior to Admission medications   Medication Sig Start Date End Date Taking? Authorizing Provider  ibuprofen (ADVIL,MOTRIN) 800 MG tablet Take 1 tablet (800 mg total) by mouth every 8 (eight) hours as needed. 02/16/18   Gerda Diss, DO  lamoTRIgine (LAMICTAL) 200 MG tablet Take 200 mg by mouth daily. 11/13/18   [provider]  lisdexamfetamine (VYVANSE) 20 MG capsule Take 20 mg by mouth daily.    [provider]  potassium chloride 20 MEQ TBCR Take 20 mEq by mouth 2 (two) times daily. 01/31/19   Baldwin Racicot, Barbette Hair, MD  promethazine-dextromethorphan (PROMETHAZINE-DM) 6.25-15 MG/5ML syrup Take 5 mLs by mouth 4 (four) times daily as needed. 11/17/18   Dutch Quint B, FNP  temazepam (RESTORIL) 15 MG capsule Take 15 mg by mouth daily. 11/13/18   [provider]  vortioxetine HBr (TRINTELLIX) 5 MG TABS tablet Take 5 mg by mouth daily.    [provider]  VRAYLAR capsule Take 1.5 mg by mouth daily. 11/07/18   [provider]    Family History Family History  Adopted: Yes  Problem Relation Age of Onset  . Alcohol abuse Mother   . Alcohol abuse Father     Social History Social History   Tobacco Use  . Smoking status: Former Smoker    Packs/day: 0.50    Years: 4.00    Pack years: 2.00    Types: Cigarettes  . Smokeless tobacco: Never Used  Substance Use Topics  . Alcohol use: Yes  . Drug use: Yes    Types: Marijuana     Allergies   Zolpidem tartrate   Review of Systems Review of Systems  Constitutional: Negative for fever.  Respiratory: Negative for shortness of breath.   Cardiovascular: Negative for chest pain.  Gastrointestinal: Negative for abdominal pain, nausea and vomiting.  Skin: Positive for wound.  Neurological: Negative for  headaches.  Psychiatric/Behavioral: Positive for agitation.  All other systems reviewed and are negative.    Physical Exam Updated Vital Signs BP (!) 101/56   Pulse 87   Resp (!) 24   LMP 01/16/2019   SpO2 100%   Physical Exam Vitals signs and nursing note reviewed.  Constitutional:      Appearance: She is well-developed. She is not ill-appearing.  HENT:     Head: Normocephalic and atraumatic.     Mouth/Throat:     Mouth: Mucous membranes are moist.  Eyes:     Pupils: Pupils are equal, round, and reactive to light.  Cardiovascular:     Rate and Rhythm: Normal rate and regular rhythm.     Heart sounds: Normal heart sounds.  Pulmonary:     Effort: Pulmonary effort is normal. No respiratory distress.     Breath sounds: No wheezing.  Abdominal:     General: Bowel sounds are normal.     Palpations: Abdomen is soft.  Skin:    General: Skin is warm and dry.     Comments: Multiple superficial old scars noted over the bilateral upper extremities and bilateral thighs, no fresh wounds noted  Neurological:     Mental Status: She is alert and oriented to person, place, and time.  Psychiatric:        Mood and Affect: Mood normal.      ED Treatments / Results  Labs (all labs ordered are listed, but only abnormal results are displayed) Labs Reviewed  COMPREHENSIVE METABOLIC PANEL - Abnormal; Notable for the following components:      Result Value   Potassium 2.7 (*)    CO2 18 (*)    BUN 5 (*)    Calcium 8.8 (*)    All other components within normal limits  ETHANOL - Abnormal; Notable for the following components:   Alcohol, Ethyl (B) 223 (*)    All other components within normal limits  CBC  RAPID URINE DRUG SCREEN, HOSP PERFORMED  I-STAT BETA HCG BLOOD, ED ([facility], [facility], [facility] ONLY)    EKG EKG Interpretation  Date/Time:  Wednesday January 31 2019 01:02:14 EDT Ventricular Rate:  90 PR Interval:  162 QRS Duration: 80 QT Interval:  386 QTC Calculation: 472 R Axis:   69 Text  Interpretation:  Sinus rhythm with occasional Premature ventricular complexes Otherwise normal ECG Confirmed by Thayer Jew [phone]) on 01/31/2019 4:22:19 AM   Radiology No results found.  Procedures Procedures (including critical care time)  Medications Ordered in ED Medications  sodium chloride 0.9 %  bolus 1,000 mL (0 mLs Intravenous Stopped 01/31/19 0515)  potassium chloride 10 mEq in 100 mL IVPB (0 mEq Intravenous Stopped 01/31/19 0510)  magnesium sulfate IVPB 2 g 50 mL (0 g Intravenous Stopped 01/31/19 0515)  potassium chloride SA (K-DUR) CR tablet 40 mEq (40 mEq Oral Given 01/31/19 0409)     Initial Impression / Assessment and Plan / ED Course  I have reviewed the triage vital signs and the nursing notes.  Pertinent labs & imaging results that were available during my care of the patient were reviewed by me and considered in my medical decision making (see chart for details).        Patient presents with agitation in the setting of acute alcohol intoxication.  She is cooperative and provides history on exam.  Work-up notable for blood alcohol level of 223 and a potassium of 2.7.  Potassium and magnesium were replaced.  Currently, patient does not appear to be a harm to herself or others.  I spoke with her mother who confirms history and has no concerns for HI or SI at this time.  Will discharge home.  After history, exam, and medical workup I feel the patient has been appropriately medically screened and is safe for discharge home. Pertinent diagnoses were discussed with the patient. Patient was given return precautions.   Final Clinical Impressions(s) / ED Diagnoses   Final diagnoses:  Alcoholic intoxication without complication ([place])  Hypokalemia    ED Discharge Orders         Ordered    potassium chloride 20 MEQ TBCR  2 times daily     01/31/19 0517           Elizar Alpern, Barbette Hair, MD 01/31/19 [phone]

## 2019-01-31 NOTE — ED Notes (Signed)
Patient verbalizes understanding of discharge instructions. Opportunity for questioning and answers were provided. Armband removed by staff, pt discharged from ED home via POV with family. 

## 2019-02-15 ENCOUNTER — Other Ambulatory Visit: Payer: Self-pay | Admitting: *Deleted

## 2019-02-15 DIAGNOSIS — Z20822 Contact with and (suspected) exposure to covid-19: Secondary | ICD-10-CM

## 2019-02-17 NOTE — Addendum Note (Signed)
Addended by: Brigitte Pulse on: 02/17/2019 10:36 AM   Modules accepted: Orders

## 2019-02-18 NOTE — Progress Notes (Signed)
[place] Screening performed. Temperature.   Jobe Igo MSN, RN

## 2019-02-22 LAB — NOVEL CORONAVIRUS, NAA: SARS-CoV-2, NAA: NOT DETECTED

## 2019-02-23 ENCOUNTER — Other Ambulatory Visit: Payer: Self-pay | Admitting: *Deleted

## 2019-02-23 DIAGNOSIS — Z20822 Contact with and (suspected) exposure to covid-19: Secondary | ICD-10-CM

## 2019-03-02 ENCOUNTER — Other Ambulatory Visit: Payer: Self-pay

## 2019-03-02 DIAGNOSIS — Z20822 Contact with and (suspected) exposure to covid-19: Secondary | ICD-10-CM

## 2019-03-05 LAB — NOVEL CORONAVIRUS, NAA: SARS-CoV-2, NAA: NOT DETECTED

## 2019-06-11 ENCOUNTER — Telehealth: Payer: Self-pay | Admitting: *Deleted

## 2019-06-11 NOTE — Telephone Encounter (Signed)
Copied from [place] [id number]. Topic: Appointment Scheduling - Scheduling Inquiry for Clinic >> Jun 11, 2019 10:53 AM Rainey Pines A wrote: Patient would like to schedule cpe before her insurance runs out mid December. Dr. Regis Bill next avail for cpe 12/30. Please advise

## 2019-06-11 NOTE — Telephone Encounter (Signed)
Pt has been made appt  

## 2019-07-09 ENCOUNTER — Encounter: Payer: Self-pay | Admitting: Internal Medicine

## 2019-07-09 ENCOUNTER — Ambulatory Visit (INDEPENDENT_AMBULATORY_CARE_PROVIDER_SITE_OTHER): Payer: 59 | Admitting: Internal Medicine

## 2019-07-09 ENCOUNTER — Other Ambulatory Visit: Payer: Self-pay

## 2019-07-09 VITALS — BP 118/68 | HR 101 | Temp 97.9°F | Ht 61.0 in | Wt 139.2 lb

## 2019-07-09 DIAGNOSIS — Z79899 Other long term (current) drug therapy: Secondary | ICD-10-CM

## 2019-07-09 DIAGNOSIS — Z8379 Family history of other diseases of the digestive system: Secondary | ICD-10-CM | POA: Diagnosis not present

## 2019-07-09 DIAGNOSIS — F509 Eating disorder, unspecified: Secondary | ICD-10-CM

## 2019-07-09 DIAGNOSIS — Z0000 Encounter for general adult medical examination without abnormal findings: Secondary | ICD-10-CM | POA: Diagnosis not present

## 2019-07-09 DIAGNOSIS — F17209 Nicotine dependence, unspecified, with unspecified nicotine-induced disorders: Secondary | ICD-10-CM | POA: Diagnosis not present

## 2019-07-09 DIAGNOSIS — R52 Pain, unspecified: Secondary | ICD-10-CM

## 2019-07-09 DIAGNOSIS — R202 Paresthesia of skin: Secondary | ICD-10-CM | POA: Diagnosis not present

## 2019-07-09 DIAGNOSIS — Z8719 Personal history of other diseases of the digestive system: Secondary | ICD-10-CM | POA: Diagnosis not present

## 2019-07-09 LAB — T4, FREE: Free T4: 0.89 ng/dL (ref 0.60–1.60)

## 2019-07-09 LAB — BASIC METABOLIC PANEL
BUN: 10 mg/dL (ref 6–23)
CO2: 23 mEq/L (ref 19–32)
Calcium: 9.5 mg/dL (ref 8.4–10.5)
Chloride: 102 mEq/L (ref 96–112)
Creatinine, Ser: 0.59 mg/dL (ref 0.40–1.20)
GFR: 123.49 mL/min (ref >60.00)
Glucose, Bld: 109 mg/dL — ABNORMAL HIGH (ref 70–99)
Potassium: 3.7 mEq/L (ref 3.5–5.1)
Sodium: 136 mEq/L (ref 135–145)

## 2019-07-09 LAB — CBC WITH DIFFERENTIAL/PLATELET
Basophils Absolute: 0 10*3/uL (ref 0.0–0.1)
Basophils Relative: 0.8 % (ref 0.0–3.0)
Eosinophils Absolute: 0.2 10*3/uL (ref 0.0–0.7)
Eosinophils Relative: 3 % (ref 0.0–5.0)
HCT: 40.7 % (ref 36.0–46.0)
Hemoglobin: 14.1 g/dL (ref 12.0–15.0)
Lymphocytes Relative: 30.1 % (ref 12.0–46.0)
Lymphs Abs: 1.8 10*3/uL (ref 0.7–4.0)
MCHC: 34.7 g/dL (ref 30.0–36.0)
MCV: 89.2 fl (ref 78.0–100.0)
Monocytes Absolute: 0.3 10*3/uL (ref 0.1–1.0)
Monocytes Relative: 5.2 % (ref 3.0–12.0)
Neutro Abs: 3.7 10*3/uL (ref 1.4–7.7)
Neutrophils Relative %: 60.9 % (ref 43.0–77.0)
Platelets: 270 10*3/uL (ref 150.0–400.0)
RBC: 4.56 Mil/uL (ref 3.87–5.11)
RDW: 12.7 % (ref 11.5–15.5)
WBC: 6.1 10*3/uL (ref 4.0–10.5)

## 2019-07-09 LAB — IBC + FERRITIN
Ferritin: 27.2 ng/mL (ref 10.0–291.0)
Iron: 72 ug/dL (ref 42–145)
Saturation Ratios: 24.1 % (ref 20.0–50.0)
Transferrin: 213 mg/dL (ref 212.0–360.0)

## 2019-07-09 LAB — LIPID PANEL
Cholesterol: 176 mg/dL (ref 0–200)
HDL: 52.9 mg/dL (ref >39.00)
LDL Cholesterol: 108 mg/dL — ABNORMAL HIGH (ref 0–99)
NonHDL: 122.84
Total CHOL/HDL Ratio: 3
Triglycerides: 75 mg/dL (ref 0.0–149.0)
VLDL: 15 mg/dL (ref 0.0–40.0)

## 2019-07-09 LAB — HEPATIC FUNCTION PANEL
ALT: 16 U/L (ref 0–35)
AST: 18 U/L (ref 0–37)
Albumin: 4.4 g/dL (ref 3.5–5.2)
Alkaline Phosphatase: 44 U/L (ref 39–117)
Bilirubin, Direct: 0.1 mg/dL (ref 0.0–0.3)
Total Bilirubin: 0.4 mg/dL (ref 0.2–1.2)
Total Protein: 7.4 g/dL (ref 6.0–8.3)

## 2019-07-09 LAB — TSH: TSH: 1.79 u[IU]/mL (ref 0.35–4.50)

## 2019-07-09 LAB — FOLATE: Folate: 10.1 ng/mL (ref >5.9)

## 2019-07-09 LAB — C-REACTIVE PROTEIN: CRP: <1 mg/dL (ref 0.5–20.0)

## 2019-07-09 LAB — HEMOGLOBIN A1C: Hgb A1c MFr Bld: 5.2 % (ref 4.6–6.5)

## 2019-07-09 LAB — VITAMIN B12: Vitamin B-12: 209 pg/mL — ABNORMAL LOW (ref 211–911)

## 2019-07-09 NOTE — Patient Instructions (Addendum)
Checking  Labs for  Celiac  Thyroid b12  Etc.   Sx can also be from  Medications  If we dont have a cause and  persistent or progressive suggest we ask neurology about possible causes and management.     Health Maintenance, Female Adopting a healthy lifestyle and getting preventive care are important in promoting health and wellness. Ask your health care provider about:  The right schedule for you to have regular tests and exams.  Things you can do on your own to prevent diseases and keep yourself healthy. What should I know about diet, weight, and exercise? Eat a healthy diet   Eat a diet that includes plenty of vegetables, fruits, low-fat dairy products, and lean protein.  Do not eat a lot of foods that are high in solid fats, added sugars, or sodium. Maintain a healthy weight Body mass index (BMI) is used to identify weight problems. It estimates body fat based on height and weight. Your health care provider can help determine your BMI and help you achieve or maintain a healthy weight. Get regular exercise Get regular exercise. This is one of the most important things you can do for your health. Most adults should:  Exercise for at least 150 minutes each week. The exercise should increase your heart rate and make you sweat (moderate-intensity exercise).  Do strengthening exercises at least twice a week. This is in addition to the moderate-intensity exercise.  Spend less time sitting. Even light physical activity can be beneficial. Watch cholesterol and blood lipids Have your blood tested for lipids and cholesterol at 25 years of age, then have this test every 5 years. Have your cholesterol levels checked more often if:  Your lipid or cholesterol levels are high.  You are older than 25 years of age.  You are at high risk for heart disease. What should I know about cancer screening? Depending on your health history and family history, you may need to have cancer screening at  various ages. This may include screening for:  Breast cancer.  Cervical cancer.  Colorectal cancer.  Skin cancer.  Lung cancer. What should I know about heart disease, diabetes, and high blood pressure? Blood pressure and heart disease  High blood pressure causes heart disease and increases the risk of stroke. This is more likely to develop in people who have high blood pressure readings, are of African descent, or are overweight.  Have your blood pressure checked: ? Every 3-5 years if you are 42-29 years of age. ? Every year if you are 41 years old or older. Diabetes Have regular diabetes screenings. This checks your fasting blood sugar level. Have the screening done:  Once every three years after age 61 if you are at a normal weight and have a low risk for diabetes.  More often and at a younger age if you are overweight or have a high risk for diabetes. What should I know about preventing infection? Hepatitis B If you have a higher risk for hepatitis B, you should be screened for this virus. Talk with your health care provider to find out if you are at risk for hepatitis B infection. Hepatitis C Testing is recommended for:  Everyone born from [birth year] through 1967.  Anyone with known risk factors for hepatitis C. Sexually transmitted infections (STIs)  Get screened for STIs, including gonorrhea and chlamydia, if: ? You are sexually active and are younger than 25 years of age. ? You are older than 24 years of  age and your health care provider tells you that you are at risk for this type of infection. ? Your sexual activity has changed since you were last screened, and you are at increased risk for chlamydia or gonorrhea. Ask your health care provider if you are at risk.  Ask your health care provider about whether you are at high risk for HIV. Your health care provider may recommend a prescription medicine to help prevent HIV infection. If you choose to take medicine to prevent  HIV, you should first get tested for HIV. You should then be tested every 3 months for as long as you are taking the medicine. Pregnancy  If you are about to stop having your period (premenopausal) and you may become pregnant, seek counseling before you get pregnant.  Take 400 to 800 micrograms (mcg) of folic acid every day if you become pregnant.  Ask for birth control (contraception) if you want to prevent pregnancy. Osteoporosis and menopause Osteoporosis is a disease in which the bones lose minerals and strength with aging. This can result in bone fractures. If you are 81 years old or older, or if you are at risk for osteoporosis and fractures, ask your health care provider if you should:  Be screened for bone loss.  Take a calcium or vitamin D supplement to lower your risk of fractures.  Be given hormone replacement therapy (HRT) to treat symptoms of menopause. Follow these instructions at home: Lifestyle  Do not use any products that contain nicotine or tobacco, such as cigarettes, e-cigarettes, and chewing tobacco. If you need help quitting, ask your health care provider.  Do not use street drugs.  Do not share needles.  Ask your health care provider for help if you need support or information about quitting drugs. Alcohol use  Do not drink alcohol if: ? Your health care provider tells you not to drink. ? You are pregnant, may be pregnant, or are planning to become pregnant.  If you drink alcohol: ? Limit how much you use to 0-1 drink a day. ? Limit intake if you are breastfeeding.  Be aware of how much alcohol is in your drink. In the [place] one drink equals one 12 oz bottle of beer (355 mL), one 5 oz glass of wine (148 mL), or one 1 oz glass of hard liquor (44 mL). General instructions  Schedule regular health, dental, and eye exams.  Stay current with your vaccines.  Tell your health care provider if: ? You often feel depressed. ? You have ever been abused or do  not feel safe at home. Summary  Adopting a healthy lifestyle and getting preventive care are important in promoting health and wellness.  Follow your health care provider's instructions about healthy diet, exercising, and getting tested or screened for diseases.  Follow your health care provider's instructions on monitoring your cholesterol and blood pressure. This information is not intended to replace advice given to you by your health care provider. Make sure you discuss any questions you have with your health care provider. Document Released: 02/08/2011 Document Revised: 07/19/2018 Document Reviewed: 07/19/2018 Elsevier Patient Education  2020 [employer].  Peripheral Neuropathy Peripheral neuropathy is a type of nerve damage. It affects nerves that carry signals between the spinal cord and the arms, legs, and the rest of the body (peripheral nerves). It does not affect nerves in the spinal cord or brain. In peripheral neuropathy, one nerve or a group of nerves may be damaged. Peripheral neuropathy is a  broad category that includes many specific nerve disorders, like diabetic neuropathy, hereditary neuropathy, and carpal tunnel syndrome. What are the causes? This condition may be caused by:  Diabetes. This is the most common cause of peripheral neuropathy.  Nerve injury.  Pressure or stress on a nerve that lasts a long time.  Lack (deficiency) of B vitamins. This can result from alcoholism, poor diet, or a restricted diet.  Infections.  Autoimmune diseases, such as rheumatoid arthritis and systemic lupus erythematosus.  Nerve diseases that are passed from parent to child (inherited).  Some medicines, such as cancer medicines (chemotherapy).  Poisonous (toxic) substances, such as lead and mercury.  Too little blood flowing to the legs.  Kidney disease.  Thyroid disease. In some cases, the cause of this condition is not known. What are the signs or symptoms? Symptoms of  this condition depend on which of your nerves is damaged. Common symptoms include:  Loss of feeling (numbness) in the feet, hands, or both.  Tingling in the feet, hands, or both.  Burning pain.  Very sensitive skin.  Weakness.  Not being able to move a part of the body (paralysis).  Muscle twitching.  Clumsiness or poor coordination.  Loss of balance.  Not being able to control your bladder.  Feeling dizzy.  Sexual problems. How is this diagnosed? Diagnosing and finding the cause of peripheral neuropathy can be difficult. Your health care provider will take your medical history and do a physical exam. A neurological exam will also be done. This involves checking things that are affected by your brain, spinal cord, and nerves (nervous system). For example, your health care provider will check your reflexes, how you move, and what you can feel. You may have other tests, such as:  Blood tests.  Electromyogram (EMG) and nerve conduction tests. These tests check nerve function and how well the nerves are controlling the muscles.  Imaging tests, such as CT scans or MRI to rule out other causes of your symptoms.  Removing a small piece of nerve to be examined in a lab (nerve biopsy). This is rare.  Removing and examining a small amount of the fluid that surrounds the brain and spinal cord (lumbar puncture). This is rare. How is this treated? Treatment for this condition may involve:  Treating the underlying cause of the neuropathy, such as diabetes, kidney disease, or vitamin deficiencies.  Stopping medicines that can cause neuropathy, such as chemotherapy.  Medicine to relieve pain. Medicines may include: ? Prescription or over-the-counter pain medicine. ? Antiseizure medicine. ? Antidepressants. ? Pain-relieving patches that are applied to painful areas of skin.  Surgery to relieve pressure on a nerve or to destroy a nerve that is causing pain.  Physical therapy to  help improve movement and balance.  Devices to help you move around (assistive devices). Follow these instructions at home: Medicines  Take over-the-counter and prescription medicines only as told by your health care provider. Do not take any other medicines without first asking your health care provider.  Do not drive or use heavy machinery while taking prescription pain medicine. Lifestyle   Do not use any products that contain nicotine or tobacco, such as cigarettes and e-cigarettes. Smoking keeps blood from reaching damaged nerves. If you need help quitting, ask your health care provider.  Avoid or limit alcohol. Too much alcohol can cause a vitamin B deficiency, and vitamin B is needed for healthy nerves.  Eat a healthy diet. This includes: ? Eating foods that are high  in fiber, such as fresh fruits and vegetables, whole grains, and beans. ? Limiting foods that are high in fat and processed sugars, such as fried or sweet foods. General instructions   If you have diabetes, work closely with your health care provider to keep your blood sugar under control.  If you have numbness in your feet: ? Check every day for signs of injury or infection. Watch for redness, warmth, and swelling. ? Wear padded socks and comfortable shoes. These help protect your feet.  Develop a good support system. Living with peripheral neuropathy can be stressful. Consider talking with a mental health specialist or joining a support group.  Use assistive devices and attend physical therapy as told by your health care provider. This may include using a walker or a cane.  Keep all follow-up visits as told by your health care provider. This is important. Contact a health care provider if:  You have new signs or symptoms of peripheral neuropathy.  You are struggling emotionally from dealing with peripheral neuropathy.  Your pain is not well-controlled. Get help right away if:  You have an injury or  infection that is not healing normally.  You develop new weakness in an arm or leg.  You fall frequently. Summary  Peripheral neuropathy is when the nerves in the arms, or legs are damaged, resulting in numbness, weakness, or pain.  There are many causes of peripheral neuropathy, including diabetes, pinched nerves, vitamin deficiencies, autoimmune disease, and hereditary conditions.  Diagnosing and finding the cause of peripheral neuropathy can be difficult. Your health care provider will take your medical history, do a physical exam, and do tests, including blood tests and nerve function tests.  Treatment involves treating the underlying cause of the neuropathy and taking medicines to help control pain. Physical therapy and assistive devices may also help. This information is not intended to replace advice given to you by your health care provider. Make sure you discuss any questions you have with your health care provider. Document Released: 07/16/2002 Document Revised: 07/08/2017 Document Reviewed: 10/04/2016 Elsevier Patient Education  2020 [employer].

## 2019-07-09 NOTE — Progress Notes (Signed)
This visit occurred during the SARS-CoV-2 public health emergency.  Safety protocols were in place, including screening questions prior to the visit, additional usage of staff PPE, and extensive cleaning of exam room while observing appropriate contact time as indicated for disinfecting solutions.    Chief Complaint  Patient presents with  . Annual Exam    Pt wants to discuss getting labs done believes she could of autoimmune disease and has been experiencing neuropathy.     HPI: Patient  Candace Shaw  23 y.o. comes in today for Preventive Health Care visit   Amy blicki   prescribe and New    therapist  Dia Crawford .   Has concerns she could have underlying cause of sx she has had for a few years  Hands and feet get tingling and hot feeling.   At times ;not sure. Cause or trigger but readings thinks could be neuropathy  Pain in body and ibs sx.     . For  Years.  Sometimes wears  Sandals in winter.  Feeling. That her feet are too hot  Denies hyperventilation  And no recent etoh  Albeit hx of same  Hands and feet at the same time  Gabapentin for sleep hasn't helped the  Tingling numb feeling  Also has battled disordered eating in past and some now  bulimic  ocass catharsis and could use some help there  Also   Let go from last job  Social work like  Very stressful and was hard to cope with suicidal  behavior of clients  To go to school [employer]  Next semester   Birth grandmother had celiac   Many  others with fibromyalgia  Health Maintenance  Topic Date Due  . HIV Screening  10/21/2008  . PAP-Cervical Cytology Screening  07/08/2020 (Originally 03/27/2018)  . PAP SMEAR-Modifier  04/22/2020  . TETANUS/TDAP  02/17/2028  . INFLUENZA VACCINE  Completed   Health Maintenance Review LIFESTYLE:  Exercise:   Walks dog 3 miles per day .  Tobacco/ETS:  vapes a lot less than  smoking     About 3 cig per day.  Alcohol:   In nune  After 3 years and now no    Sugar beverages: nota  Lot   Sleep:   Depends onmood  9 or less  Drug use: not since august  July .  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   Nursing.     ROS:  No swelling joints unusual rash fevers  GEN/ HEENT: No fever, significant weight changes sweats headaches vision problems hearing changes, CV/ PULM; No chest pain shortness of breath cough, syncope,edema  change in exercise tolerance. GI /GU: No adominal pain, vomiting, change in bowel habits. No blood in the stool. No significant GU symptoms. SKIN/HEME: ,no acute skin rashes suspicious lesions or bleeding. No lymphadenopathy, nodules, masses.  NEURO/ PSYCH:  No neurologic signs such as weakness numbness off and on as above No depression anxiety. IMM/ Allergy: No unusual infections.  Allergy .   REST of 12 system review negative except as per HPI   Past Medical History:  Diagnosis Date  . Asthma   . Bipolar disorder ([employer])   . Depression   . Hallucinogen dependence, unspecified 10/03/2012  . Headache(784.0)   . Obesity   . Personality disorder [facility])     Past Surgical History:  Procedure Laterality Date  . WISDOM TOOTH EXTRACTION  Age 28  Family History  Adopted: Yes  Problem Relation Age of Onset  . Alcohol abuse Mother   . Alcohol abuse Father     Social History   Socioeconomic History  . Marital status: Single    Spouse name: Not on file  . Number of children: Not on file  . Years of education: Not on file  . Highest education level: Not on file  Occupational History  . Occupation: coffee shop   Social Needs  . Financial resource strain: Not on file  . Food insecurity    Worry: Not on file    Inability: Not on file  . Transportation needs    Medical: Not on file    Non-medical: Not on file  Tobacco Use  . Smoking status: Former Smoker    Packs/day: 0.50    Years: 4.00    Pack years: 2.00    Types: Cigarettes  . Smokeless tobacco: Never Used  Substance and Sexual Activity  . Alcohol use: Yes  . Drug  use: Yes    Types: Marijuana  . Sexual activity: Not Currently    Partners: Female    Birth control/protection: Condom, OCP, Pill  Lifestyle  . Physical activity    Days per week: Not on file    Minutes per session: Not on file  . Stress: Not on file  Relationships  . Social Herbalist on phone: Not on file    Gets together: Not on file    Attends religious service: Not on file    Active member of club or organization: Not on file    Attends meetings of clubs or organizations: Not on file    Relationship status: Not on file  Other Topics Concern  . 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 lamoTRIgine (LAMICTAL) 200 MG tablet Take 200 mg by mouth daily.    Marland Kitchen lisdexamfetamine (VYVANSE) 20 MG capsule Take 20 mg by mouth daily.    Marland Kitchen VRAYLAR capsule Take 1.5 mg by mouth daily.    Marland Kitchen buPROPion (WELLBUTRIN XL) 150 MG 24 hr tablet Take 150 mg by mouth daily.    Marland Kitchen doxepin (SINEQUAN) 25 MG capsule Take 25 mg by mouth at bedtime as needed.    . gabapentin (NEURONTIN) 300 MG capsule 3 (three) times daily.    Marland Kitchen doxepin (SINEQUAN) 10 MG/ML solution TK 0.5 ML PO QHS PRF INSOMNIA    . ibuprofen (ADVIL,MOTRIN) 800 MG tablet Take 1 tablet (800 mg total) by mouth every 8 (eight) hours as needed. 60 tablet 1  . potassium chloride 20 MEQ TBCR Take 20 mEq by mouth 2 (two) times daily. 10 tablet 0  . promethazine-dextromethorphan (PROMETHAZINE-DM) 6.25-15 MG/5ML syrup Take 5 mLs by mouth 4 (four) times daily as needed. (Patient not taking: Reported on 07/09/2019) 118 mL 0  . temazepam (RESTORIL) 15 MG capsule Take 15 mg by mouth daily.    Marland Kitchen vortioxetine HBr (TRINTELLIX) 5 MG TABS tablet Take 5 mg by mouth daily.     No facility-administered  medications prior to visit.      EXAM:  BP 118/68 (BP Location: Right Arm, Patient Position:  Sitting, Cuff Size: Normal)   Pulse (!) 101   Temp 97.9 F (36.6 C) (Temporal)   Ht '5\' 1"'  (1.549 m)   Wt 139 lb 3.2 oz (63.1 kg)   SpO2 98%   BMI 26.30 kg/m   Body mass index is 26.3 kg/m. Wt Readings from Last 3 Encounters:  07/09/19 139 lb 3.2 oz (63.1 kg)  11/17/18 165 lb (74.8 kg)  09/19/18 178 lb 3.2 oz (80.8 kg)    Physical Exam: Vital signs reviewed XLK:GMWN is a well-developed well-nourished alert cooperative    who appearsr stated age in no acute distress.  HEENT: normocephalic atraumatic , Eyes: PERRL EOM's full, conjunctiva clear,., Ears: no deformity EAC's clear TMs with normal landmarks. Mouth:masked  NECK: supple without masses, thyromegaly or bruits. CHEST/PULM:  Clear to auscultation and percussion breath sounds equal no wheeze , rales or rhonchi. No chest wall deformities or tenderness. Breast: normal by inspection . No dimpling, discharge, masses, tenderness or discharge . CV: PMI is nondisplaced, S1 S2 no gallops, murmurs, rubs. Peripheral pulses are full without delay.No JVD .  ABDOMEN: Bowel sounds normal nontender  No guard or rebound, no hepato splenomegal no CVA tenderness.   Extremtities:  No clubbing cyanosis or edema, no acute joint swelling or redness no focal atrophy NEURO:  Oriented x3, cranial nerves 3-12 appear to be intact, no obvious focal weakness,gait within normal limits no abnormal reflexes or asymmetrical vibration senst not tested  Nl gait  SKIN: No acute rashes normal turgor, color, no bruising or petechiae. multiple tatooes  No infections PSYCH: Oriented, good eye contact, no obvious depression anxiety, cognition and judgment appear normal. LN: no cervical axillary inguinal adenopathy  Lab Results  Component Value Date   WBC 8.7 01/30/2019   HGB 12.3 01/30/2019   HCT 36.4 01/30/2019   PLT 259 01/30/2019   GLUCOSE 94 01/30/2019   CHOL 147  09/29/2017   TRIG 82.0 09/29/2017   HDL 38.00 (L) 09/29/2017   LDLCALC 93 09/29/2017   ALT 16 01/30/2019   AST 21 01/30/2019   NA 138 01/30/2019   K 2.7 (LL) 01/30/2019   CL 106 01/30/2019   CREATININE 0.60 01/30/2019   BUN 5 (L) 01/30/2019   CO2 18 (L) 01/30/2019   TSH 2.90 05/12/2018   HGBA1C 5.8 09/29/2017    BP Readings from Last 3 Encounters:  07/09/19 118/68  01/31/19 (!) 101/56  01/26/19 121/89    Lab results reviewed with patient   ASSESSMENT AND PLAN:  Discussed the following assessment and plan:    ICD-10-CM   1. Visit for preventive health examination  U27.25 Basic metabolic panel    CBC with Differential    Hemoglobin A1c    Hepatic function panel    Lipid panel    TSH    T4, Free (Thyrox)    Celiac Disease Comprehensive Panel with Reflexes    HLA typing for celiac disease    B12    Vitamin B1    IBC + Ferritin [place]    HIV Antibody (Reflex)    Hepatitis C antibody    ANA  2. Family history of celiac disease  D66.44 Basic metabolic panel    CBC with Differential    Hemoglobin A1c    Hepatic function panel    Lipid panel    TSH    T4, Free (Thyrox)    Celiac Disease Comprehensive Panel with Reflexes    HLA typing for celiac disease    B12  Vitamin B1    IBC + Ferritin [place]    HIV Antibody (Reflex)    Hepatitis C antibody    ANA    Folate    C-reactive protein  3. Medication management  E07.121 Basic metabolic panel    CBC with Differential    Hemoglobin A1c    Hepatic function panel    Lipid panel    TSH    T4, Free (Thyrox)    Celiac Disease Comprehensive Panel with Reflexes    HLA typing for celiac disease    B12    Vitamin B1    IBC + Ferritin [place]    HIV Antibody (Reflex)    Hepatitis C antibody    ANA    Folate    C-reactive protein  4. Tingling in extremities  F75.8 Basic metabolic panel    CBC with Differential    Hemoglobin A1c    Hepatic function panel    Lipid panel    TSH    T4, Free (Thyrox)     Celiac Disease Comprehensive Panel with Reflexes    HLA typing for celiac disease    B12    Vitamin B1    IBC + Ferritin [place]    HIV Antibody (Reflex)    Hepatitis C antibody    ANA    Folate    C-reactive protein  5. History of IBS  I32.54 Basic metabolic panel    CBC with Differential    Hemoglobin A1c    Hepatic function panel    Lipid panel    TSH    T4, Free (Thyrox)    Celiac Disease Comprehensive Panel with Reflexes    HLA typing for celiac disease    B12    Vitamin B1    IBC + Ferritin [place]    HIV Antibody (Reflex)    Hepatitis C antibody    ANA    Folate    C-reactive protein  6. Complaints of total body pain  D82 Basic metabolic panel    CBC with Differential    Hemoglobin A1c    Hepatic function panel    Lipid panel    TSH    T4, Free (Thyrox)    Celiac Disease Comprehensive Panel with Reflexes    HLA typing for celiac disease    B12    Vitamin B1    IBC + Ferritin     HIV Antibody (Reflex)    Hepatitis C antibody    ANA    Folate    C-reactive protein  7. Eating disorder, unspecified type  F50.9    poss restrictive  some bulemia  8. Nicotine dependence with nicotine-induced disorder, unspecified nicotine product type  F17.209    dsic multiple causes of  parathesias check labs  Exam reassusring Patient Care Team: Panosh, Standley Brooking, MD as PCP - General sarah gates as Counselor Patient Instructions  Checking  Labs for  Celiac  Thyroid b12  Etc.   Sx can also be from  Medications  If we dont have a cause and  persistent or progressive suggest we ask neurology about possible causes and management.     Health Maintenance, Female Adopting a healthy lifestyle and getting preventive care are important in promoting health and wellness. Ask your health care provider about:  The right schedule for you to have regular tests and exams.  Things you can do on your own to prevent diseases and keep yourself healthy. What should I know about diet,  weight, and exercise? Eat a healthy diet   Eat a diet that includes plenty of vegetables, fruits, low-fat dairy products, and lean protein.  Do not eat a lot of foods that are high in solid fats, added sugars, or sodium. Maintain a healthy weight Body mass index (BMI) is used to identify weight problems. It estimates body fat based on height and weight. Your health care provider can help determine your BMI and help you achieve or maintain a healthy weight. Get regular exercise Get regular exercise. This is one of the most important things you can do for your health. Most adults should:  Exercise for at least 150 minutes each week. The exercise should increase your heart rate and make you sweat (moderate-intensity exercise).  Do strengthening exercises at least twice a week. This is in addition to the moderate-intensity exercise.  Spend less time sitting. Even light physical activity can be beneficial. Watch cholesterol and blood lipids Have your blood tested for lipids and cholesterol at 25 years of age, then have this test every 5 years. Have your cholesterol levels checked more often if:  Your lipid or cholesterol levels are high.  You are older than 25 years of age.  You are at high risk for heart disease. What should I know about cancer screening? Depending on your health history and family history, you may need to have cancer screening at various ages. This may include screening for:  Breast cancer.  Cervical cancer.  Colorectal cancer.  Skin cancer.  Lung cancer. What should I know about heart disease, diabetes, and high blood pressure? Blood pressure and heart disease  High blood pressure causes heart disease and increases the risk of stroke. This is more likely to develop in people who have high blood pressure readings, are of African descent, or are overweight.  Have your blood pressure checked: ? Every 3-5 years if you are 64-74 years of age. ? Every year if you  are 43 years old or older. Diabetes Have regular diabetes screenings. This checks your fasting blood sugar level. Have the screening done:  Once every three years after age 78 if you are at a normal weight and have a low risk for diabetes.  More often and at a younger age if you are overweight or have a high risk for diabetes. What should I know about preventing infection? Hepatitis B If you have a higher risk for hepatitis B, you should be screened for this virus. Talk with your health care provider to find out if you are at risk for hepatitis B infection. Hepatitis C Testing is recommended for:  Everyone born from [birth year] through 1967.  Anyone with known risk factors for hepatitis C. Sexually transmitted infections (STIs)  Get screened for STIs, including gonorrhea and chlamydia, if: ? You are sexually active and are younger than 26 years of age. ? You are older than 25 years of age and your health care provider tells you that you are at risk for this type of infection. ? Your sexual activity has changed since you were last screened, and you are at increased risk for chlamydia or gonorrhea. Ask your health care provider if you are at risk.  Ask your health care provider about whether you are at high risk for HIV. Your health care provider may recommend a prescription medicine to help prevent HIV infection. If you choose to take medicine to prevent HIV, you should first get tested for HIV. You should then be tested every 3 months  for as long as you are taking the medicine. Pregnancy  If you are about to stop having your period (premenopausal) and you may become pregnant, seek counseling before you get pregnant.  Take 400 to 800 micrograms (mcg) of folic acid every day if you become pregnant.  Ask for birth control (contraception) if you want to prevent pregnancy. Osteoporosis and menopause Osteoporosis is a disease in which the bones lose minerals and strength with aging. This can  result in bone fractures. If you are 89 years old or older, or if you are at risk for osteoporosis and fractures, ask your health care provider if you should:  Be screened for bone loss.  Take a calcium or vitamin D supplement to lower your risk of fractures.  Be given hormone replacement therapy (HRT) to treat symptoms of menopause. Follow these instructions at home: Lifestyle  Do not use any products that contain nicotine or tobacco, such as cigarettes, e-cigarettes, and chewing tobacco. If you need help quitting, ask your health care provider.  Do not use street drugs.  Do not share needles.  Ask your health care provider for help if you need support or information about quitting drugs. Alcohol use  Do not drink alcohol if: ? Your health care provider tells you not to drink. ? You are pregnant, may be pregnant, or are planning to become pregnant.  If you drink alcohol: ? Limit how much you use to 0-1 drink a day. ? Limit intake if you are breastfeeding.  Be aware of how much alcohol is in your drink. In the [place] one drink equals one 12 oz bottle of beer (355 mL), one 5 oz glass of wine (148 mL), or one 1 oz glass of hard liquor (44 mL). General instructions  Schedule regular health, dental, and eye exams.  Stay current with your vaccines.  Tell your health care provider if: ? You often feel depressed. ? You have ever been abused or do not feel safe at home. Summary  Adopting a healthy lifestyle and getting preventive care are important in promoting health and wellness.  Follow your health care provider's instructions about healthy diet, exercising, and getting tested or screened for diseases.  Follow your health care provider's instructions on monitoring your cholesterol and blood pressure. This information is not intended to replace advice given to you by your health care provider. Make sure you discuss any questions you have with your health care provider. Document  Released: 02/08/2011 Document Revised: 07/19/2018 Document Reviewed: 07/19/2018 Elsevier Patient Education  2020 [place].  Peripheral Neuropathy Peripheral neuropathy is a type of nerve damage. It affects nerves that carry signals between the spinal cord and the arms, legs, and the rest of the body (peripheral nerves). It does not affect nerves in the spinal cord or brain. In peripheral neuropathy, one nerve or a group of nerves may be damaged. Peripheral neuropathy is a broad category that includes many specific nerve disorders, like diabetic neuropathy, hereditary neuropathy, and carpal tunnel syndrome. What are the causes? This condition may be caused by:  Diabetes. This is the most common cause of peripheral neuropathy.  Nerve injury.  Pressure or stress on a nerve that lasts a long time.  Lack (deficiency) of B vitamins. This can result from alcoholism, poor diet, or a restricted diet.  Infections.  Autoimmune diseases, such as rheumatoid arthritis and systemic lupus erythematosus.  Nerve diseases that are passed from parent to child (inherited).  Some medicines, such as cancer medicines (chemotherapy).  Poisonous (toxic) substances, such as lead and mercury.  Too little blood flowing to the legs.  Kidney disease.  Thyroid disease. In some cases, the cause of this condition is not known. What are the signs or symptoms? Symptoms of this condition depend on which of your nerves is damaged. Common symptoms include:  Loss of feeling (numbness) in the feet, hands, or both.  Tingling in the feet, hands, or both.  Burning pain.  Very sensitive skin.  Weakness.  Not being able to move a part of the body (paralysis).  Muscle twitching.  Clumsiness or poor coordination.  Loss of balance.  Not being able to control your bladder.  Feeling dizzy.  Sexual problems. How is this diagnosed? Diagnosing and finding the cause of peripheral neuropathy can be  difficult. Your health care provider will take your medical history and do a physical exam. A neurological exam will also be done. This involves checking things that are affected by your brain, spinal cord, and nerves (nervous system). For example, your health care provider will check your reflexes, how you move, and what you can feel. You may have other tests, such as:  Blood tests.  Electromyogram (EMG) and nerve conduction tests. These tests check nerve function and how well the nerves are controlling the muscles.  Imaging tests, such as CT scans or MRI to rule out other causes of your symptoms.  Removing a small piece of nerve to be examined in a lab (nerve biopsy). This is rare.  Removing and examining a small amount of the fluid that surrounds the brain and spinal cord (lumbar puncture). This is rare. How is this treated? Treatment for this condition may involve:  Treating the underlying cause of the neuropathy, such as diabetes, kidney disease, or vitamin deficiencies.  Stopping medicines that can cause neuropathy, such as chemotherapy.  Medicine to relieve pain. Medicines may include: ? Prescription or over-the-counter pain medicine. ? Antiseizure medicine. ? Antidepressants. ? Pain-relieving patches that are applied to painful areas of skin.  Surgery to relieve pressure on a nerve or to destroy a nerve that is causing pain.  Physical therapy to help improve movement and balance.  Devices to help you move around (assistive devices). Follow these instructions at home: Medicines  Take over-the-counter and prescription medicines only as told by your health care provider. Do not take any other medicines without first asking your health care provider.  Do not drive or use heavy machinery while taking prescription pain medicine. Lifestyle   Do not use any products that contain nicotine or tobacco, such as cigarettes and e-cigarettes. Smoking keeps blood from reaching damaged  nerves. If you need help quitting, ask your health care provider.  Avoid or limit alcohol. Too much alcohol can cause a vitamin B deficiency, and vitamin B is needed for healthy nerves.  Eat a healthy diet. This includes: ? Eating foods that are high in fiber, such as fresh fruits and vegetables, whole grains, and beans. ? Limiting foods that are high in fat and processed sugars, such as fried or sweet foods. General instructions   If you have diabetes, work closely with your health care provider to keep your blood sugar under control.  If you have numbness in your feet: ? Check every day for signs of injury or infection. Watch for redness, warmth, and swelling. ? Wear padded socks and comfortable shoes. These help protect your feet.  Develop a good support system. Living with peripheral neuropathy can be stressful. Consider talking with  a mental health specialist or joining a support group.  Use assistive devices and attend physical therapy as told by your health care provider. This may include using a walker or a cane.  Keep all follow-up visits as told by your health care provider. This is important. Contact a health care provider if:  You have new signs or symptoms of peripheral neuropathy.  You are struggling emotionally from dealing with peripheral neuropathy.  Your pain is not well-controlled. Get help right away if:  You have an injury or infection that is not healing normally.  You develop new weakness in an arm or leg.  You fall frequently. Summary  Peripheral neuropathy is when the nerves in the arms, or legs are damaged, resulting in numbness, weakness, or pain.  There are many causes of peripheral neuropathy, including diabetes, pinched nerves, vitamin deficiencies, autoimmune disease, and hereditary conditions.  Diagnosing and finding the cause of peripheral neuropathy can be difficult. Your health care provider will take your medical history, do a physical  exam, and do tests, including blood tests and nerve function tests.  Treatment involves treating the underlying cause of the neuropathy and taking medicines to help control pain. Physical therapy and assistive devices may also help. This information is not intended to replace advice given to you by your health care provider. Make sure you discuss any questions you have with your health care provider. Document Released: 07/16/2002 Document Revised: 07/08/2017 Document Reviewed: 10/04/2016 Elsevier Patient Education  2020 [place] Panosh M.D.

## 2019-07-12 LAB — CELIAC DISEASE COMPREHENSIVE PANEL WITH REFLEXES
(tTG) Ab, IgA: 1 U/mL
Immunoglobulin A: 286 mg/dL (ref 47–310)

## 2019-07-13 LAB — HIV ANTIBODY (ROUTINE TESTING W REFLEX): HIV 1&2 Ab, 4th Generation: NONREACTIVE

## 2019-07-13 LAB — HEPATITIS C ANTIBODY
Hepatitis C Ab: NONREACTIVE
SIGNAL TO CUT-OFF: 0.02 (ref <1.00)

## 2019-07-13 LAB — ANA: Anti Nuclear Antibody (ANA): NEGATIVE

## 2019-07-13 LAB — HLA TYPING FOR CELIAC DISEASE
HLA-DQ2: NEGATIVE
HLA-DQ8: NEGATIVE
HLA-DQA1*: 1
HLA-DQA1*: 5
HLA-DQB1*: 301
HLA-DQB1*: 501

## 2019-07-13 LAB — VITAMIN B1: Vitamin B1 (Thiamine): 12 nmol/L (ref 8–30)

## 2019-07-16 ENCOUNTER — Other Ambulatory Visit: Payer: Self-pay

## 2019-07-16 DIAGNOSIS — E538 Deficiency of other specified B group vitamins: Secondary | ICD-10-CM

## 2019-07-16 DIAGNOSIS — R202 Paresthesia of skin: Secondary | ICD-10-CM

## 2019-07-19 ENCOUNTER — Other Ambulatory Visit: Payer: Self-pay

## 2019-07-19 ENCOUNTER — Other Ambulatory Visit (INDEPENDENT_AMBULATORY_CARE_PROVIDER_SITE_OTHER): Payer: 59

## 2019-07-19 DIAGNOSIS — E538 Deficiency of other specified B group vitamins: Secondary | ICD-10-CM | POA: Diagnosis not present

## 2019-07-19 DIAGNOSIS — R202 Paresthesia of skin: Secondary | ICD-10-CM | POA: Diagnosis not present

## 2019-07-19 LAB — VITAMIN B12: Vitamin B-12: 208 pg/mL — ABNORMAL LOW (ref 211–911)

## 2019-07-19 LAB — FOLATE: Folate: 6.6 ng/mL (ref >5.9)

## 2019-07-22 LAB — METHYLMALONIC ACID, SERUM: Methylmalonic Acid, Quant: 89 nmol/L (ref 87–318)

## 2019-07-22 LAB — INTRINSIC FACTOR ANTIBODIES: Intrinsic Factor: NEGATIVE

## 2019-07-30 ENCOUNTER — Other Ambulatory Visit: Payer: Self-pay

## 2019-07-30 DIAGNOSIS — E538 Deficiency of other specified B group vitamins: Secondary | ICD-10-CM

## 2019-07-30 NOTE — Progress Notes (Signed)
So  b12 still ow but the other labs dont confirm this.  However  I advise  oral b12 1000 mcg sublingual ( dissolvable) every day  ( this is otc) and we can  repeat vit b12 level in  2-3 months

## 2019-08-13 DIAGNOSIS — F603 Borderline personality disorder: Secondary | ICD-10-CM | POA: Diagnosis not present

## 2019-08-16 ENCOUNTER — Ambulatory Visit: Payer: 59 | Attending: Internal Medicine

## 2019-08-16 DIAGNOSIS — Z20822 Contact with and (suspected) exposure to covid-19: Secondary | ICD-10-CM

## 2019-08-18 LAB — NOVEL CORONAVIRUS, NAA: SARS-CoV-2, NAA: NOT DETECTED

## 2019-08-20 DIAGNOSIS — F603 Borderline personality disorder: Secondary | ICD-10-CM | POA: Diagnosis not present

## 2019-08-27 DIAGNOSIS — F603 Borderline personality disorder: Secondary | ICD-10-CM | POA: Diagnosis not present

## 2019-09-03 DIAGNOSIS — F603 Borderline personality disorder: Secondary | ICD-10-CM | POA: Diagnosis not present

## 2019-09-10 DIAGNOSIS — F603 Borderline personality disorder: Secondary | ICD-10-CM | POA: Diagnosis not present

## 2019-09-17 DIAGNOSIS — F603 Borderline personality disorder: Secondary | ICD-10-CM | POA: Diagnosis not present

## 2019-09-18 DIAGNOSIS — F3132 Bipolar disorder, current episode depressed, moderate: Secondary | ICD-10-CM | POA: Diagnosis not present

## 2019-09-18 DIAGNOSIS — F603 Borderline personality disorder: Secondary | ICD-10-CM | POA: Diagnosis not present

## 2019-09-18 DIAGNOSIS — F902 Attention-deficit hyperactivity disorder, combined type: Secondary | ICD-10-CM | POA: Diagnosis not present

## 2019-09-18 DIAGNOSIS — F509 Eating disorder, unspecified: Secondary | ICD-10-CM | POA: Diagnosis not present

## 2019-09-24 ENCOUNTER — Telehealth: Payer: Self-pay | Admitting: Internal Medicine

## 2019-09-24 ENCOUNTER — Other Ambulatory Visit: Payer: Self-pay

## 2019-09-24 DIAGNOSIS — F603 Borderline personality disorder: Secondary | ICD-10-CM | POA: Diagnosis not present

## 2019-09-24 NOTE — Telephone Encounter (Signed)
Pt has class until 2pm everyday and is having issues with her fingers. They are swollen/painful/red and she has scoliosis so she is concerned that it is connected to that.  I was wanting to see if we could place her in the 3pm virtual slot on 09/25/19   Pt can be reached at [phone]

## 2019-09-24 NOTE — Telephone Encounter (Signed)
Ok with me to do in person as requested

## 2019-09-25 ENCOUNTER — Encounter: Payer: Self-pay | Admitting: Internal Medicine

## 2019-09-25 ENCOUNTER — Ambulatory Visit (INDEPENDENT_AMBULATORY_CARE_PROVIDER_SITE_OTHER): Payer: BC Managed Care – PPO | Admitting: Internal Medicine

## 2019-09-25 ENCOUNTER — Other Ambulatory Visit (INDEPENDENT_AMBULATORY_CARE_PROVIDER_SITE_OTHER): Payer: BC Managed Care – PPO

## 2019-09-25 VITALS — BP 100/60 | HR 94 | Temp 98.3°F | Ht 61.0 in | Wt 134.1 lb

## 2019-09-25 DIAGNOSIS — Z872 Personal history of diseases of the skin and subcutaneous tissue: Secondary | ICD-10-CM

## 2019-09-25 DIAGNOSIS — E538 Deficiency of other specified B group vitamins: Secondary | ICD-10-CM

## 2019-09-25 DIAGNOSIS — L539 Erythematous condition, unspecified: Secondary | ICD-10-CM

## 2019-09-25 DIAGNOSIS — M25562 Pain in left knee: Secondary | ICD-10-CM

## 2019-09-25 DIAGNOSIS — Z789 Other specified health status: Secondary | ICD-10-CM

## 2019-09-25 DIAGNOSIS — M25561 Pain in right knee: Secondary | ICD-10-CM

## 2019-09-25 DIAGNOSIS — M7989 Other specified soft tissue disorders: Secondary | ICD-10-CM | POA: Diagnosis not present

## 2019-09-25 LAB — CBC WITH DIFFERENTIAL/PLATELET
Basophils Absolute: 0 10*3/uL (ref 0.0–0.1)
Basophils Relative: 0.5 % (ref 0.0–3.0)
Eosinophils Absolute: 0.1 10*3/uL (ref 0.0–0.7)
Eosinophils Relative: 0.9 % (ref 0.0–5.0)
HCT: 40.5 % (ref 36.0–46.0)
Hemoglobin: 13.6 g/dL (ref 12.0–15.0)
Lymphocytes Relative: 22.8 % (ref 12.0–46.0)
Lymphs Abs: 1.7 10*3/uL (ref 0.7–4.0)
MCHC: 33.7 g/dL (ref 30.0–36.0)
MCV: 90.1 fl (ref 78.0–100.0)
Monocytes Absolute: 0.3 10*3/uL (ref 0.1–1.0)
Monocytes Relative: 3.4 % (ref 3.0–12.0)
Neutro Abs: 5.5 10*3/uL (ref 1.4–7.7)
Neutrophils Relative %: 72.4 % (ref 43.0–77.0)
Platelets: 287 10*3/uL (ref 150.0–400.0)
RBC: 4.5 Mil/uL (ref 3.87–5.11)
RDW: 13 % (ref 11.5–15.5)
WBC: 7.6 10*3/uL (ref 4.0–10.5)

## 2019-09-25 LAB — C-REACTIVE PROTEIN: CRP: <1 mg/dL (ref 0.5–20.0)

## 2019-09-25 LAB — VITAMIN B12: Vitamin B-12: 212 pg/mL (ref 211–911)

## 2019-09-25 LAB — SEDIMENTATION RATE: Sed Rate: 2 mm/hr (ref 0–20)

## 2019-09-25 NOTE — Patient Instructions (Signed)
Plan lab  And referral to rheumatology for opinion .  Keep taking pix if new sx

## 2019-09-25 NOTE — Progress Notes (Signed)
This visit occurred during the SARS-CoV-2 public health emergency.  Safety protocols were in place, including screening questions prior to the visit, additional usage of staff PPE, and extensive cleaning of exam room while observing appropriate contact time as indicated for disinfecting solutions.    Chief Complaint  Patient presents with  . Joint Swelling    painful joints in fingers on right hand  . Peripheral Neuropathy    neuropathy in feet    HPI: Candace Shaw 24 y.o. come in for on going sx  See past notes  Taking b12  ? 1500 per day since last visit  Sl. Is still vegan but no sig supplements. gatting more hand am stiffness and  Color changes  And  Hands  And feet Feeling hot in extremities  And redness .   Knee sna nd back stiff  Worse in am ?   Pink areas on distal right fingers swollne distal.   asked if could be erythromelagia ?  Hx of psoriasis as a child scalp and skin  Not  Much now   Is not on higher dose of  Gaba and elavil added for neuropathic sx but no help .   No  etoh rd    Fevers    Gi is  Her "ibs"  Nothing new   ROS: See pertinent positives and negatives per HPI.  Past Medical History:  Diagnosis Date  . Asthma   . Bipolar disorder ([place])   . Depression   . Hallucinogen dependence, unspecified 10/03/2012  . Headache(784.0)   . Obesity   . Personality disorder ([place])     Family History  Adopted: Yes  Problem Relation Age of Onset  . Alcohol abuse Mother   . Alcohol abuse Father     Social History   Socioeconomic History  . Marital status: Single    Spouse name: Not on file  . Number of children: Not on file  . Years of education: Not on file  . Highest education level: Not on file  Occupational History  . Occupation: coffee shop   Tobacco Use  . Smoking status: Former Smoker    Packs/day: 0.50    Years: 4.00    Pack years: 2.00    Types: Cigarettes  . Smokeless tobacco: Never Used  Substance and Sexual Activity  . Alcohol use:  Yes  . Drug use: Yes    Types: Marijuana  . Sexual activity: Not Currently    Partners: Female    Birth control/protection: Condom, OCP, Pill  Other Topics Concern  . Not on file  Social History Narrative   Legal Guardians: Herbie [place] and [place]   Lives with rrommate works  [employer]  59 - 30 hours per week    Social Determinants of Health   Financial Resource Strain:   . Difficulty of Paying Living Expenses: Not on file  Food Insecurity:   . Worried About Charity fundraiser in the Last Year: Not on file  . Ran Out of Food in the Last Year: Not on file  Transportation Needs:   . Lack of Transportation (Medical): Not on file  . Lack of Transportation (Non-Medical): Not on file  Physical Activity:   . Days of Exercise per Week: Not on file  . Minutes of Exercise per Session: Not on file  Stress:   . Feeling of Stress :  Not on file  Social Connections:   . Frequency of Communication with Friends and Family: Not on file  . Frequency of Social Gatherings with Friends and Family: Not on file  . Attends Religious Services: Not on file  . Active Member of Clubs or Organizations: Not on file  . Attends Archivist Meetings: Not on file  . Marital Status: Not on file    Outpatient Medications Prior to Visit  Medication Sig Dispense Refill  . amitriptyline (ELAVIL) 25 MG tablet Take 12.5-25 mg by mouth at bedtime.    Marland Kitchen GABAPENTIN PO Take 900 mg by mouth at bedtime.     . influenza vac split quadrivalent PF (AFLURIA QUADRIVALENT) 0.5 ML injection Afluria Qd 2020-21 (36 mos up)(PF)60 mcg (15 mcg x4)/0.5 mL IM syringe    . lamoTRIgine (LAMICTAL) 100 MG tablet Take 300 mg by mouth at bedtime.    Marland Kitchen lisdexamfetamine (VYVANSE) 20 MG capsule Take 20 mg by mouth daily.    Marland Kitchen VRAYLAR 4.5 MG CAPS Take 1 capsule by mouth daily.    Marland Kitchen buPROPion (WELLBUTRIN XL) 150 MG 24 hr tablet Take 150 mg by mouth daily.    Marland Kitchen  doxepin (SINEQUAN) 25 MG capsule Take 25 mg by mouth at bedtime as needed.    . lamoTRIgine (LAMICTAL) 200 MG tablet Take 200 mg by mouth daily.    Marland Kitchen VRAYLAR capsule Take 1.5 mg by mouth daily.     No facility-administered medications prior to visit.     EXAM:  BP 100/60 (BP Location: Left Arm, Patient Position: Sitting, Cuff Size: Normal)   Pulse 94   Temp 98.3 F (36.8 C) (Temporal)   Ht _0  (1.549 m)   Wt 134 lb 2 oz (60.8 kg)   SpO2 94%   BMI 25.34 kg/m   Body mass index is 25.34 kg/m.  GENERAL: vitals reviewed and listed above, alert, oriented, appears well hydrated and in no acute distress HEENT: atraumatic, conjunctiva  clear, no obvious abnormalities on inspection of external nose and ears OP : masked  NECK: no obvious masses on inspection palpation  LUNGS: clear to auscultation bilaterally, no wheezes, rales or rhonchi, good air movement CV: HRRR, no clubbing cyanosis or  peripheral edema nl cap refill  MS: moves all extremities hand  periungal redness right  Index and middle  And  Left  Nl rom says feels stiff distal erythem    Skin  Established tatoos   PSYCH: pleasant and cooperative, no obvious depression or anxiety Lab Results  Component Value Date   WBC 6.1 07/09/2019   HGB 14.1 07/09/2019   HCT 40.7 07/09/2019   PLT 270.0 07/09/2019   GLUCOSE 109 (H) 07/09/2019   CHOL 176 07/09/2019   TRIG 75.0 07/09/2019   HDL 52.90 07/09/2019   LDLCALC 108 (H) 07/09/2019   ALT 16 07/09/2019   AST 18 07/09/2019   NA 136 07/09/2019   K 3.7 07/09/2019   CL 102 07/09/2019   CREATININE 0.59 07/09/2019   BUN 10 07/09/2019   CO2 23 07/09/2019   TSH 1.79 07/09/2019   HGBA1C 5.2 07/09/2019   BP Readings from Last 3 Encounters:  09/25/19 100/60  07/09/19 118/68  01/31/19 (!) 101/56   revewied findings    ASSESSMENT AND PLAN:  Discussed the following assessment and plan:  Low vitamin B12 level - Plan: CBC with Differential/Platelet, Vitamin B12, HLA-B27 antigen,  Sedimentation rate, C-reactive protein, Rheumatoid Factor, Cyclic citrul peptide antibody, IgG  Finger swelling - Plan:  CBC with Differential/Platelet, Vitamin B12, HLA-B27 antigen, Sedimentation rate, C-reactive protein, Rheumatoid Factor, Cyclic citrul peptide antibody, IgG  Pain in both knees, unspecified chronicity - Plan: CBC with Differential/Platelet, Vitamin B12, HLA-B27 antigen, Sedimentation rate, C-reactive protein, Rheumatoid Factor, Cyclic citrul peptide antibody, IgG  History of psoriasis - Plan: CBC with Differential/Platelet, Vitamin B12, HLA-B27 antigen, Sedimentation rate, C-reactive protein, Rheumatoid Factor, Cyclic citrul peptide antibody, IgG  Redness - Plan: CBC with Differential/Platelet, Vitamin B12, HLA-B27 antigen, Sedimentation rate, C-reactive protein, Rheumatoid Factor, Cyclic citrul peptide antibody, IgG  Vegan diet Uncertain cause of sx   Neuropathic sx like  Small fiber?  Or other  Vs  inflammatory  Joints sx such as  Psoriatic ? Other   Checking further labs  And referring to rheumatology realizing no dx may be made but may need to be followed .  Ana crp neg in past  Repeat b12   Adopted  So no help with fam hx -Patient advised to return or notify health care team  if  new concerns arise.  Patient Instructions  Plan lab  And referral to rheumatology for opinion .  Keep taking pix if new sx     Rion Catala K. Kayline Sheer M.D.

## 2019-09-25 NOTE — Telephone Encounter (Signed)
See feb 21 visit  Referring to rheumatology

## 2019-09-26 ENCOUNTER — Ambulatory Visit: Payer: 59 | Admitting: Family Medicine

## 2019-09-27 ENCOUNTER — Other Ambulatory Visit: Payer: Self-pay

## 2019-09-27 DIAGNOSIS — E538 Deficiency of other specified B group vitamins: Secondary | ICD-10-CM

## 2019-09-27 DIAGNOSIS — M7989 Other specified soft tissue disorders: Secondary | ICD-10-CM

## 2019-09-27 DIAGNOSIS — M2540 Effusion, unspecified joint: Secondary | ICD-10-CM

## 2019-09-27 LAB — RHEUMATOID FACTOR: Rheumatoid fact SerPl-aCnc: <14 IU/mL (ref <14)

## 2019-09-27 LAB — CYCLIC CITRUL PEPTIDE ANTIBODY, IGG: Cyclic Citrullin Peptide Ab: <16 UNITS

## 2019-09-27 LAB — HLA-B27 ANTIGEN: HLA-B27 Antigen: NEGATIVE

## 2019-09-27 NOTE — Progress Notes (Signed)
So all testing  is negative  inflammation markers  but B12  although now in normal range is  still   on low side .  I would like the level to be closer to 500 . Options:double your daily dose of vit b12   ( 3000 iu per day ) or a trial of injectable B12  1000 mcg  every week for  8 weeks ( this would entail coming to the office to get injections ) Either way recheck Vit b12 level in 8 weeks

## 2019-09-28 NOTE — Telephone Encounter (Signed)
I agree that doesn not explain your sx  Se we should proceed with rhuemaology referral  And begin b12 injection in interim  [place] please arrange injections and  Referral to rheumatology  Dx hand and joint swelling

## 2019-10-01 ENCOUNTER — Ambulatory Visit: Payer: BC Managed Care – PPO | Attending: Internal Medicine

## 2019-10-01 DIAGNOSIS — F603 Borderline personality disorder: Secondary | ICD-10-CM | POA: Diagnosis not present

## 2019-10-01 DIAGNOSIS — Z20822 Contact with and (suspected) exposure to covid-19: Secondary | ICD-10-CM | POA: Diagnosis not present

## 2019-10-02 LAB — NOVEL CORONAVIRUS, NAA: SARS-CoV-2, NAA: NOT DETECTED

## 2019-10-04 ENCOUNTER — Other Ambulatory Visit: Payer: Self-pay

## 2019-10-05 ENCOUNTER — Ambulatory Visit (INDEPENDENT_AMBULATORY_CARE_PROVIDER_SITE_OTHER): Payer: BC Managed Care – PPO

## 2019-10-05 DIAGNOSIS — E538 Deficiency of other specified B group vitamins: Secondary | ICD-10-CM | POA: Diagnosis not present

## 2019-10-05 MED ORDER — CYANOCOBALAMIN 1000 MCG/ML IJ SOLN
1000.0000 ug | Freq: Once | INTRAMUSCULAR | Status: AC
  Administered 2019-10-05: 11:00:00 1000 ug via INTRAMUSCULAR

## 2019-10-05 NOTE — Progress Notes (Signed)
Per orders of Dr. Regis Bill, injection of 1st B12 given left arm by Franco Collet. Patient tolerated injection well.

## 2019-10-08 DIAGNOSIS — F603 Borderline personality disorder: Secondary | ICD-10-CM | POA: Diagnosis not present

## 2019-10-11 ENCOUNTER — Other Ambulatory Visit: Payer: Self-pay

## 2019-10-12 ENCOUNTER — Ambulatory Visit (INDEPENDENT_AMBULATORY_CARE_PROVIDER_SITE_OTHER): Payer: BC Managed Care – PPO | Admitting: *Deleted

## 2019-10-12 DIAGNOSIS — E538 Deficiency of other specified B group vitamins: Secondary | ICD-10-CM | POA: Diagnosis not present

## 2019-10-12 MED ORDER — CYANOCOBALAMIN 1000 MCG/ML IJ SOLN
1000.0000 ug | Freq: Once | INTRAMUSCULAR | Status: AC
  Administered 2019-10-12: 1000 ug via INTRAMUSCULAR

## 2019-10-12 NOTE — Progress Notes (Signed)
Per orders of Dr. Panosh, injection of B 12 given by Favio Moder S Charlette Hennings. Patient tolerated injection well. 

## 2019-10-18 ENCOUNTER — Other Ambulatory Visit: Payer: Self-pay

## 2019-10-19 ENCOUNTER — Ambulatory Visit (INDEPENDENT_AMBULATORY_CARE_PROVIDER_SITE_OTHER): Payer: BC Managed Care – PPO

## 2019-10-19 DIAGNOSIS — E538 Deficiency of other specified B group vitamins: Secondary | ICD-10-CM

## 2019-10-19 DIAGNOSIS — F603 Borderline personality disorder: Secondary | ICD-10-CM | POA: Diagnosis not present

## 2019-10-19 DIAGNOSIS — F3132 Bipolar disorder, current episode depressed, moderate: Secondary | ICD-10-CM | POA: Diagnosis not present

## 2019-10-19 DIAGNOSIS — F902 Attention-deficit hyperactivity disorder, combined type: Secondary | ICD-10-CM | POA: Diagnosis not present

## 2019-10-19 DIAGNOSIS — F509 Eating disorder, unspecified: Secondary | ICD-10-CM | POA: Diagnosis not present

## 2019-10-19 MED ORDER — CYANOCOBALAMIN 1000 MCG/ML IJ SOLN
1000.0000 ug | Freq: Once | INTRAMUSCULAR | Status: AC
  Administered 2019-10-19: 1000 ug via INTRAMUSCULAR

## 2019-10-19 NOTE — Progress Notes (Signed)
Per orders of Shanon Ace, injection of B12 given in Left deltoid by Franco Collet. Patient tolerated injection well.

## 2019-10-19 NOTE — Patient Instructions (Signed)
There are no preventive care reminders to display for this patient.  Depression screen PHQ 2/9 07/09/2019  Decreased Interest 0  Down, Depressed, Hopeless 1  PHQ - 2 Score 1

## 2019-10-25 ENCOUNTER — Other Ambulatory Visit: Payer: Self-pay

## 2019-10-26 ENCOUNTER — Ambulatory Visit (INDEPENDENT_AMBULATORY_CARE_PROVIDER_SITE_OTHER): Payer: BC Managed Care – PPO

## 2019-10-26 DIAGNOSIS — E538 Deficiency of other specified B group vitamins: Secondary | ICD-10-CM

## 2019-10-26 MED ORDER — CYANOCOBALAMIN 1000 MCG/ML IJ SOLN
1000.0000 ug | Freq: Once | INTRAMUSCULAR | Status: AC
  Administered 2019-10-26: 1000 ug via INTRAMUSCULAR

## 2019-10-26 NOTE — Progress Notes (Signed)
Per orders of Dr. Regis Bill , injection of B12 given in R deltoid by Franco Collet. Pt receiving the 4th injection out of 8 weeks. Patient tolerated injection well.

## 2019-10-26 NOTE — Patient Instructions (Signed)
There are no preventive care reminders to display for this patient.  Depression screen PHQ 2/9 07/09/2019  Decreased Interest 0  Down, Depressed, Hopeless 1  PHQ - 2 Score 1

## 2019-10-29 DIAGNOSIS — F603 Borderline personality disorder: Secondary | ICD-10-CM | POA: Diagnosis not present

## 2019-11-01 ENCOUNTER — Other Ambulatory Visit: Payer: Self-pay

## 2019-11-02 ENCOUNTER — Ambulatory Visit (INDEPENDENT_AMBULATORY_CARE_PROVIDER_SITE_OTHER): Payer: BC Managed Care – PPO | Admitting: *Deleted

## 2019-11-02 DIAGNOSIS — E538 Deficiency of other specified B group vitamins: Secondary | ICD-10-CM | POA: Diagnosis not present

## 2019-11-02 MED ORDER — CYANOCOBALAMIN 1000 MCG/ML IJ SOLN
1000.0000 ug | Freq: Once | INTRAMUSCULAR | Status: AC
  Administered 2019-11-02: 1000 ug via INTRAMUSCULAR

## 2019-11-02 NOTE — Progress Notes (Signed)
Patient in office for B-12 injection. PCP out of office. Note sent to covering provider to sign off on.

## 2019-11-12 DIAGNOSIS — F603 Borderline personality disorder: Secondary | ICD-10-CM | POA: Diagnosis not present

## 2019-11-12 NOTE — Progress Notes (Signed)
Office Visit Note  Patient: Candace Shaw             Date of Birth: 04-26-1996           MRN: [id number]             PCP: Burnis Medin, MD Referring: Burnis Medin, MD Visit Date: 11/16/2019 Occupation: '@GUAROCC'$ @  Subjective:  New Patient (Initial Visit) (Autoimmune symptoms)   History of Present Illness: Candace Shaw is a 24 y.o. female seen in consultation per request of her PCP.  According to patient since she was a small child she remembers that her hands and feet used to turn red.  She states in the last few years her feet feel very hot and caused insomnia.  She also has noticed rashes on her face lasting for about 1 to 49month in the last year.  At the end of last year she started having swelling in her hands and knee joints.  She is also had lower back pain for the last 2 years.  She occasional discomfort in her rib cage.  She states she was diagnosed with psoriasis when she was a child and was in the care of her dermatologist.  She has not seen a dermatologist in a long time but she has some scabs on her scalp.  She is adopted and does not know much about the family history but she believes that she has few cousins with fibromyalgia and her maternal aunt with rheumatoid arthritis.  Activities of Daily Living:  Patient reports morning stiffness for 2 hours.   Patient Reports nocturnal pain.  Difficulty dressing/grooming: Denies Difficulty climbing stairs: Reports Difficulty getting out of chair: Reports Difficulty using hands for taps, buttons, cutlery, and/or writing: Reports  Review of Systems  Constitutional: Positive for fatigue. Negative for night sweats, weight gain and weight loss.  HENT: Positive for mouth dryness. Negative for mouth sores, trouble swallowing, trouble swallowing and nose dryness.   Eyes: Positive for dryness. Negative for pain, redness and visual disturbance.  Respiratory: Negative for cough, shortness of breath and difficulty  breathing.   Cardiovascular: Positive for swelling in legs/feet. Negative for chest pain, palpitations, hypertension and irregular heartbeat.  Gastrointestinal: Positive for diarrhea. Negative for blood in stool and constipation.       3-4 loose stools daily.  Endocrine: Positive for heat intolerance and excessive thirst. Negative for cold intolerance and increased urination.  Genitourinary: Negative for difficulty urinating and vaginal dryness.  Musculoskeletal: Positive for arthralgias, joint pain, joint swelling and morning stiffness. Negative for myalgias, muscle weakness, muscle tenderness and myalgias.  Skin: Positive for rash. Negative for color change, hair loss, skin tightness, ulcers and sensitivity to sunlight.  Allergic/Immunologic: Negative for susceptible to infections.  Neurological: Negative for dizziness, numbness, memory loss, night sweats and weakness.  Hematological: Negative for bruising/bleeding tendency and swollen glands.  Psychiatric/Behavioral: Positive for sleep disturbance. Negative for depressed mood. The patient is not nervous/anxious.     PMFS History:  Patient Active Problem List   Diagnosis Date Noted  . Snake bite 02/16/2018  . Acute lymphadenitis of lower extremity 02/16/2018  . Nonallopathic lesion of thoracic region 06/30/2016  . Nonallopathic lesion of rib cage 06/30/2016  . Nonallopathic lesion of cervical region 06/30/2016  . Scapular dyskinesis 04/29/2016  . Subacromial bursitis 04/29/2016  . Substance abuse in [place] ([place] 03/28/2015  . Headache(784.0) 12/14/2013  . OCP (oral contraceptive pills) initiation 08/15/2013  . Smoker 02/13/2013  . Depression 12/05/2012  .  Tobacco user 08/19/2012  . Exercise-induced asthma 04/03/2011    Past Medical History:  Diagnosis Date  . Asthma   . Bipolar disorder ([place])   . Depression   . Hallucinogen dependence, unspecified 10/03/2012  . Headache(784.0)   . Obesity   . Personality disorder ([place])      Family History  Adopted: Yes  Problem Relation Age of Onset  . Alcohol abuse Mother   . Alcohol abuse Father    Past Surgical History:  Procedure Laterality Date  . 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 Influenza,inj,Quad PF,6+ Mos 04/27/2016, 07/10/2018, 05/17/2019  . Td 02/16/2018  . Tdap 01/28/2016     Objective: Vital Signs: BP 116/73 (BP Location: Right Arm, Patient Position: Sitting, Cuff Size: Normal)   Pulse (!) 121   Resp 14   Ht 5' (1.524 m)   Wt 134 lb (60.8 kg)   BMI 26.17 kg/m    Physical Exam Vitals and nursing note reviewed.  Constitutional:      Appearance: She is well-developed.  HENT:     Head: Normocephalic and atraumatic.  Eyes:     Conjunctiva/sclera: Conjunctivae normal.  Cardiovascular:     Rate and Rhythm: Normal rate and regular rhythm.     Heart sounds: Normal heart sounds.  Pulmonary:     Effort: Pulmonary effort is normal.     Breath sounds: Normal breath sounds.  Abdominal:     General: Bowel sounds are normal.     Palpations: Abdomen is soft.  Musculoskeletal:     Cervical back: Normal range of motion.  Lymphadenopathy:     Cervical: No cervical adenopathy.  Skin:    General: Skin is warm and dry.     Capillary Refill: Capillary refill takes less than 2 seconds.     Comments: Few red patches were noted on her scalp without scaling.  Neurological:     Mental Status: She is alert and oriented to person, place, and time.  Psychiatric:        Behavior: Behavior normal.      Musculoskeletal Exam: C-spine thoracic and lumbar spine with good range of motion.  She has tenderness on palpation of her SI joints and also gluteal region.  She had bilateral trapezius  spasm.  She has tenderness over her rib cage.  She had tenderness over bilateral trochanteric bursa.  Shoulder joints, elbow joints, wrist joints, MCPs, PIPs and DIPs with good range of motion with no synovitis.  Hip joints, knee joints, ankles, MTPs and PIPs with good range of motion with no synovitis.  CDAI Exam: CDAI Score: -- Patient Global: --; Provider Global: -- Swollen: --; Tender: -- Joint Exam 11/16/2019   No joint exam has been documented for this visit   There is currently no information documented on the homunculus. Go to the Rheumatology activity and complete the homunculus joint exam.  Investigation: No additional findings.  Imaging: XR Hand 2 View Left  Result Date: 11/16/2019 No MCP, PIP or DIP narrowing was noted.  No intercarpal radiocarpal joint space narrowing was noted.  No erosive changes were noted. Impression: Unremarkable x-ray of the hand.  XR Hand 2 View Right  Result Date: 11/16/2019 No MCP, PIP or DIP narrowing was noted.  No intercarpal radiocarpal joint space narrowing was noted.  No erosive changes were noted. Impression: Unremarkable x-ray of the hand.  XR KNEE 3 VIEW LEFT  Result Date: 11/16/2019 No medial lateral compartment narrowing was noted.  No patellofemoral narrowing was noted.  No chondrocalcinosis was noted. Impression: Unremarkable x-ray of the knee joint.  XR KNEE 3 VIEW RIGHT  Result Date: 11/16/2019 No medial or lateral compartment narrowing was noted.  No patellofemoral narrowing was noted.  No chondrocalcinosis was noted. Impression: Unremarkable x-ray of the knee joint.  XR Lumbar Spine 2-3 Views  Result Date: 11/16/2019 No significant disc space narrowing was noted.  Mild facet joint arthropathy was noted.  No SI joint sclerosis or narrowing was noted. Impression: These findings are consistent with facet joint arthropathy.   Recent Labs: Lab Results  Component Value Date   WBC 7.6 09/25/2019   HGB 13.6 09/25/2019   PLT 287.0  09/25/2019   NA 136 07/09/2019   K 3.7 07/09/2019   CL 102 07/09/2019   CO2 23 07/09/2019   GLUCOSE 109 (H) 07/09/2019   BUN 10 07/09/2019   CREATININE 0.59 07/09/2019   BILITOT 0.4 07/09/2019   ALKPHOS 44 07/09/2019   AST 18 07/09/2019   ALT 16 07/09/2019   PROT 7.4 07/09/2019   ALBUMIN 4.4 07/09/2019   CALCIUM 9.5 07/09/2019   GFRAA >60 01/30/2019    Speciality Comments: No specialty comments available.  Procedures:  No procedures performed Allergies: Zolpidem tartrate   Assessment / Plan:     Visit Diagnoses: Polyarthralgia - 09/25/19: CCP<16, RF<14, CRP<1, ESR 2, HLA-B27-, Vitamin B12 212  Pain in both hands -patient complains of pain and persistent swelling in her hands.  I did not see any synovitis on examination.  She had good capillary refill.  Plan: XR Hand 2 View Right, XR Hand 2 View Left.  X-ray of bilateral hands were unremarkable.  Chronic pain of both knees -she complains of pain and persistent swelling in her knee joints.  I did not notice any swelling on examination today.  No warmth swelling or effusion was noted.  Plan: XR KNEE 3 VIEW RIGHT, XR KNEE 3 VIEW LEFT.  X-ray of bilateral knee joints were unremarkable.  Chronic bilateral low back pain without sciatica -she complains of chronic lower back pain.  She also had some SI joint tenderness.  She also has gluteal tenderness and trochanteric bursa tenderness.  Plan: XR Lumbar Spine 2-3 Views.  Mild facet joint arthropathy was noted.  No SI joint changes were noted.  Myalgia-she complains of muscle pain and paresthesias in her extremities.  She has multiple tender points and hyperalgesia.  That raises concern about myofascial pain.  History of psoriasis - In the childhood.  Treated by Dr. Tonia Brooms with topical agents.  She has couple of lesions in her scalp which are red but not scaly.  Have advised her to schedule an appointment with the dermatologist for evaluation.  Diarrhea-patient gives history of 4-5 loose  stools a day without any blood.  She may have underlying IBS.  Exercise-induced asthma  Tobacco user - Only vape now. Smoked 1/2 ppd x4-5 years. Quit 2020.  Substance abuse in remission ([place]) - No alcohol or marijuana for 3 years.  History of bipolar disorder-she is on treatment.  Rash-she brought pictures of episodic rash on her face from last year.  Orders: Orders Placed This Encounter  Procedures  . XR Hand 2 View Right  . XR Hand 2 View Left  . XR KNEE 3 VIEW RIGHT  . XR KNEE 3 VIEW LEFT  . XR Lumbar Spine 2-3 Views  . COMPLETE METABOLIC PANEL WITH GFR  . Sedimentation rate  . ANA  . Anti-scleroderma antibody  . RNP Antibody  . Anti-Smith antibody  . Sjogrens syndrome-A extractable nuclear antibody  . Sjogrens syndrome-B extractable nuclear antibody  . Anti-DNA antibody, double-stranded  . C3 and C4  . Beta-2 glycoprotein antibodies  . Cardiolipin antibodies, IgG, IgM, IgA  . Lupus Anticoagulant Eval w/Reflex  . Pan-ANCA  . 14-3-3 eta Protein  . CK   No orders of the defined types were placed in this encounter.   Face-to-face time spent with patient was 50 minutes. Greater than 50% of time was spent in counseling and coordination of care.  Follow-Up Instructions: Return for Polyarthralgia, rash.   Bo Merino, MD  Note - This record has been created using Editor, commissioning.  Chart creation errors have been sought, but may not always  have been located. Such creation errors do not reflect on  the standard of medical care.

## 2019-11-15 ENCOUNTER — Other Ambulatory Visit: Payer: Self-pay

## 2019-11-16 ENCOUNTER — Encounter: Payer: Self-pay | Admitting: Rheumatology

## 2019-11-16 ENCOUNTER — Ambulatory Visit: Payer: Self-pay

## 2019-11-16 ENCOUNTER — Ambulatory Visit (INDEPENDENT_AMBULATORY_CARE_PROVIDER_SITE_OTHER): Payer: BC Managed Care – PPO | Admitting: Rheumatology

## 2019-11-16 ENCOUNTER — Ambulatory Visit (INDEPENDENT_AMBULATORY_CARE_PROVIDER_SITE_OTHER): Payer: BC Managed Care – PPO

## 2019-11-16 VITALS — BP 116/73 | HR 121 | Resp 14 | Ht 60.0 in | Wt 134.0 lb

## 2019-11-16 DIAGNOSIS — M2550 Pain in unspecified joint: Secondary | ICD-10-CM

## 2019-11-16 DIAGNOSIS — M79642 Pain in left hand: Secondary | ICD-10-CM | POA: Diagnosis not present

## 2019-11-16 DIAGNOSIS — J45990 Exercise induced bronchospasm: Secondary | ICD-10-CM

## 2019-11-16 DIAGNOSIS — M25561 Pain in right knee: Secondary | ICD-10-CM

## 2019-11-16 DIAGNOSIS — M25562 Pain in left knee: Secondary | ICD-10-CM | POA: Diagnosis not present

## 2019-11-16 DIAGNOSIS — E538 Deficiency of other specified B group vitamins: Secondary | ICD-10-CM

## 2019-11-16 DIAGNOSIS — G8929 Other chronic pain: Secondary | ICD-10-CM

## 2019-11-16 DIAGNOSIS — R21 Rash and other nonspecific skin eruption: Secondary | ICD-10-CM

## 2019-11-16 DIAGNOSIS — M791 Myalgia, unspecified site: Secondary | ICD-10-CM | POA: Diagnosis not present

## 2019-11-16 DIAGNOSIS — M545 Low back pain, unspecified: Secondary | ICD-10-CM

## 2019-11-16 DIAGNOSIS — M79641 Pain in right hand: Secondary | ICD-10-CM | POA: Diagnosis not present

## 2019-11-16 DIAGNOSIS — Z8659 Personal history of other mental and behavioral disorders: Secondary | ICD-10-CM

## 2019-11-16 DIAGNOSIS — Z872 Personal history of diseases of the skin and subcutaneous tissue: Secondary | ICD-10-CM

## 2019-11-16 DIAGNOSIS — Z720 Tobacco use: Secondary | ICD-10-CM

## 2019-11-16 DIAGNOSIS — F1911 Other psychoactive substance abuse, in remission: Secondary | ICD-10-CM

## 2019-11-16 DIAGNOSIS — R197 Diarrhea, unspecified: Secondary | ICD-10-CM

## 2019-11-16 MED ORDER — CYANOCOBALAMIN 1000 MCG/ML IJ SOLN
1000.0000 ug | Freq: Once | INTRAMUSCULAR | Status: AC
  Administered 2019-11-16: 1000 ug via INTRAMUSCULAR

## 2019-11-16 NOTE — Progress Notes (Signed)
Patient was given B 12 per Dr. Regis Bill orders today. Injection given in left deltoid. Patient tolerated injection well.

## 2019-11-17 DIAGNOSIS — F603 Borderline personality disorder: Secondary | ICD-10-CM | POA: Diagnosis not present

## 2019-11-19 DIAGNOSIS — F603 Borderline personality disorder: Secondary | ICD-10-CM | POA: Diagnosis not present

## 2019-11-20 NOTE — Progress Notes (Signed)
I will discuss labs at the follow-up visit.

## 2019-11-21 DIAGNOSIS — F509 Eating disorder, unspecified: Secondary | ICD-10-CM | POA: Diagnosis not present

## 2019-11-21 DIAGNOSIS — F902 Attention-deficit hyperactivity disorder, combined type: Secondary | ICD-10-CM | POA: Diagnosis not present

## 2019-11-21 DIAGNOSIS — F3132 Bipolar disorder, current episode depressed, moderate: Secondary | ICD-10-CM | POA: Diagnosis not present

## 2019-11-21 DIAGNOSIS — F603 Borderline personality disorder: Secondary | ICD-10-CM | POA: Diagnosis not present

## 2019-11-21 LAB — SJOGRENS SYNDROME-A EXTRACTABLE NUCLEAR ANTIBODY: SSA (Ro) (ENA) Antibody, IgG: <1 AI

## 2019-11-21 LAB — C3 AND C4
C3 Complement: 137 mg/dL (ref 83–193)
C4 Complement: 38 mg/dL (ref 15–57)

## 2019-11-21 LAB — COMPLETE METABOLIC PANEL WITH GFR
AG Ratio: 1.6 (calc) (ref 1.0–2.5)
ALT: 11 U/L (ref 6–29)
AST: 13 U/L (ref 10–30)
Albumin: 4.6 g/dL (ref 3.6–5.1)
Alkaline phosphatase (APISO): 46 U/L (ref 31–125)
BUN: 11 mg/dL (ref 7–25)
CO2: 24 mmol/L (ref 20–32)
Calcium: 9.8 mg/dL (ref 8.6–10.2)
Chloride: 102 mmol/L (ref 98–110)
Creat: 0.71 mg/dL (ref 0.50–1.10)
GFR, Est African American: 136 mL/min/{1.73_m2} (ref >=60)
GFR, Est Non African American: 118 mL/min/{1.73_m2} (ref >=60)
Globulin: 2.8 g/dL (calc) (ref 1.9–3.7)
Glucose, Bld: 81 mg/dL (ref 65–99)
Potassium: 4.1 mmol/L (ref 3.5–5.3)
Sodium: 136 mmol/L (ref 135–146)
Total Bilirubin: 0.3 mg/dL (ref 0.2–1.2)
Total Protein: 7.4 g/dL (ref 6.1–8.1)

## 2019-11-21 LAB — BETA-2 GLYCOPROTEIN ANTIBODIES
Beta-2 Glyco 1 IgA: <9 SAU (ref <=20)
Beta-2 Glyco 1 IgM: 9 SMU (ref <=20)
Beta-2 Glyco I IgG: <9 SGU (ref <=20)

## 2019-11-21 LAB — PAN-ANCA
ANCA Screen: NEGATIVE
Myeloperoxidase Abs: <1 AI
Serine Protease 3: <1 AI

## 2019-11-21 LAB — ANTI-SCLERODERMA ANTIBODY: Scleroderma (Scl-70) (ENA) Antibody, IgG: <1 AI

## 2019-11-21 LAB — ANTI-SMITH ANTIBODY: ENA SM Ab Ser-aCnc: <1 AI

## 2019-11-21 LAB — LUPUS ANTICOAGULANT EVAL W/ REFLEX
PTT-LA Screen: 34 s (ref <=40)
dRVVT: 40 s (ref <=45)

## 2019-11-21 LAB — RNP ANTIBODY: Ribonucleic Protein(ENA) Antibody, IgG: <1 AI

## 2019-11-21 LAB — SEDIMENTATION RATE: Sed Rate: 2 mm/h (ref 0–20)

## 2019-11-21 LAB — ANTI-DNA ANTIBODY, DOUBLE-STRANDED: ds DNA Ab: 4 IU/mL

## 2019-11-21 LAB — CARDIOLIPIN ANTIBODIES, IGG, IGM, IGA
Anticardiolipin IgA: <11 [APL'U]
Anticardiolipin IgG: <14 [GPL'U]
Anticardiolipin IgM: <12 [MPL'U]

## 2019-11-21 LAB — ANA: Anti Nuclear Antibody (ANA): NEGATIVE

## 2019-11-21 LAB — SJOGRENS SYNDROME-B EXTRACTABLE NUCLEAR ANTIBODY: SSB (La) (ENA) Antibody, IgG: <1 AI

## 2019-11-21 LAB — CK: Total CK: 47 U/L (ref 29–143)

## 2019-11-21 LAB — 14-3-3 ETA PROTEIN: 14-3-3 eta Protein: <0.2 ng/mL (ref <0.2)

## 2019-11-22 ENCOUNTER — Other Ambulatory Visit: Payer: Self-pay

## 2019-11-23 ENCOUNTER — Ambulatory Visit (INDEPENDENT_AMBULATORY_CARE_PROVIDER_SITE_OTHER): Payer: BC Managed Care – PPO

## 2019-11-23 DIAGNOSIS — E538 Deficiency of other specified B group vitamins: Secondary | ICD-10-CM

## 2019-11-23 MED ORDER — CYANOCOBALAMIN 1000 MCG/ML IJ SOLN
1000.0000 ug | Freq: Once | INTRAMUSCULAR | Status: AC
  Administered 2019-11-23: 1000 ug via INTRAMUSCULAR

## 2019-11-23 NOTE — Progress Notes (Signed)
Per Dr. Regis Bill orders gave patient a B12 injection in right deltoid. Patient tolerated well. Patient showed me the last injection from 2 weeks ago as she still has a little bruise.

## 2019-11-25 DIAGNOSIS — F603 Borderline personality disorder: Secondary | ICD-10-CM | POA: Diagnosis not present

## 2019-11-26 DIAGNOSIS — F603 Borderline personality disorder: Secondary | ICD-10-CM | POA: Diagnosis not present

## 2019-11-30 ENCOUNTER — Ambulatory Visit (INDEPENDENT_AMBULATORY_CARE_PROVIDER_SITE_OTHER): Payer: BC Managed Care – PPO

## 2019-11-30 ENCOUNTER — Other Ambulatory Visit: Payer: Self-pay

## 2019-11-30 DIAGNOSIS — E538 Deficiency of other specified B group vitamins: Secondary | ICD-10-CM

## 2019-11-30 MED ORDER — CYANOCOBALAMIN 1000 MCG/ML IJ SOLN
1000.0000 ug | Freq: Once | INTRAMUSCULAR | Status: AC
  Administered 2019-11-30: 1000 ug via INTRAMUSCULAR

## 2019-11-30 NOTE — Progress Notes (Signed)
Per orders of Dr. Panosh, injection of B12 given by Bianca Raneri. Patient tolerated injection well.  

## 2019-12-02 DIAGNOSIS — F603 Borderline personality disorder: Secondary | ICD-10-CM | POA: Diagnosis not present

## 2019-12-03 DIAGNOSIS — F603 Borderline personality disorder: Secondary | ICD-10-CM | POA: Diagnosis not present

## 2019-12-12 ENCOUNTER — Telehealth: Payer: Self-pay | Admitting: Internal Medicine

## 2019-12-12 NOTE — Telephone Encounter (Signed)
Medication:[facility]all [facility] [place]

## 2019-12-12 NOTE — Telephone Encounter (Signed)
This request should have been for Albuterol, NOT Adderall.  I have scheduled a MyChart visit for tomorrow at 9am to update information for refill request. Last filled in 2014 by you. Sending as Juluis Rainier

## 2019-12-12 NOTE — Telephone Encounter (Signed)
Last OV 09/25/2019  Last filled 10/23/2019, does not state quantity or refills??

## 2019-12-12 NOTE — Progress Notes (Signed)
Office Visit Note  Patient: Candace Shaw             Date of Birth: [birth year]           MRN: [id number]             PCP: Burnis Medin, MD Referring: Burnis Medin, MD Visit Date: 12/14/2019 Occupation: '@GUAROCC'$ @  Subjective:  Intermittent rash   History of Present Illness: Candace Shaw is a 24 y.o. female returns today for evaluation of polyarthralgia and rash.  She brought several pictures on her iPhone which showed rash on her extremities.  She also gives history of intermittent swelling in her hands.  She states she continues to have lower back discomfort and pain all over her body.  She has been experiencing tingling and numbness in her feet.   Activities of Daily Living:  Patient reports morning stiffness for 24 hours.   Patient Reports nocturnal pain.  Difficulty dressing/grooming: Denies Difficulty climbing stairs: Denies Difficulty getting out of chair: Denies Difficulty using hands for taps, buttons, cutlery, and/or writing: Reports  Review of Systems  Constitutional: Positive for fatigue. Negative for night sweats, weight gain and weight loss.  HENT: Positive for mouth dryness. Negative for mouth sores, trouble swallowing, trouble swallowing and nose dryness.   Eyes: Positive for dryness. Negative for pain, redness and visual disturbance.  Respiratory: Positive for shortness of breath. Negative for cough and difficulty breathing.        History of asthma  Cardiovascular: Negative for chest pain, palpitations, hypertension, irregular heartbeat and swelling in legs/feet.  Gastrointestinal: Positive for constipation and diarrhea. Negative for blood in stool.  Endocrine: Positive for heat intolerance. Negative for cold intolerance, excessive thirst and increased urination.  Genitourinary: Negative for difficulty urinating and vaginal dryness.  Musculoskeletal: Positive for arthralgias, joint pain, joint swelling, morning stiffness and muscle tenderness.  Negative for myalgias, muscle weakness and myalgias.  Skin: Positive for redness. Negative for color change, rash, hair loss, skin tightness, ulcers and sensitivity to sunlight.  Allergic/Immunologic: Negative for susceptible to infections.  Neurological: Positive for numbness. Negative for dizziness, memory loss, night sweats and weakness.  Hematological: Negative for bruising/bleeding tendency and swollen glands.  Psychiatric/Behavioral: Positive for sleep disturbance. Negative for depressed mood. The patient is not nervous/anxious.     PMFS History:  Patient Active Problem List   Diagnosis Date Noted  . Snake bite 02/16/2018  . Acute lymphadenitis of lower extremity 02/16/2018  . Nonallopathic lesion of thoracic region 06/30/2016  . Nonallopathic lesion of rib cage 06/30/2016  . Nonallopathic lesion of cervical region 06/30/2016  . Scapular dyskinesis 04/29/2016  . Subacromial bursitis 04/29/2016  . Substance abuse in remission ([place]) 03/28/2015  . Headache(784.0) 12/14/2013  . OCP (oral contraceptive pills) initiation 08/15/2013  . Smoker 02/13/2013  . Depression 12/05/2012  . Tobacco user 08/19/2012  . Exercise-induced asthma 04/03/2011    Past Medical History:  Diagnosis Date  . Asthma   . Bipolar disorder ([place])   . Depression   . Hallucinogen dependence, unspecified 10/03/2012  . Headache(784.0)   . Obesity   . Personality disorder ([place])     Family History  Adopted: Yes  Problem Relation Age of Onset  . Alcohol abuse Mother   . Alcohol abuse Father    Past Surgical History:  Procedure Laterality Date  . 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 Influenza,inj,Quad PF,6+ Mos 04/27/2016,  07/10/2018, 05/17/2019  . Td 02/16/2018  . Tdap 01/28/2016     Objective: Vital Signs: BP 113/81 (BP Location: Left Arm, Patient Position: Sitting, Cuff Size: Normal)   Pulse 78   Resp 14   Ht 5' 1.5" (1.562 m)   Wt 138 lb 3.2 oz (62.7 kg)   BMI 25.69 kg/m    Physical Exam Vitals and nursing note reviewed.  Constitutional:      Appearance: She is well-developed.  HENT:     Head: Normocephalic and atraumatic.  Eyes:     Conjunctiva/sclera: Conjunctivae normal.  Cardiovascular:     Rate and Rhythm: Normal rate and regular rhythm.     Heart sounds: Normal heart sounds.  Pulmonary:     Effort: Pulmonary effort is normal.     Breath sounds: Normal breath sounds.  Abdominal:     General: Bowel sounds are normal.     Palpations: Abdomen is soft.  Musculoskeletal:     Cervical back: Normal range of motion.  Lymphadenopathy:     Cervical: No cervical adenopathy.  Skin:    General: Skin is warm and dry.     Capillary Refill: Capillary refill takes less than 2 seconds.     Comments: No erythema or rash was noted on examination today.  Neurological:     Mental Status: She is alert and oriented to person, place, and time.  Psychiatric:        Behavior: Behavior normal.      Musculoskeletal Exam: C-spine thoracic and lumbar spine with good range of motion..  No SI joint tenderness.  Shoulder joints, elbow joints, wrist joints, MCPs PIPs and DIPs with good range of motion with no synovitis.  Hip joints, knee joints, ankles, MTPs and PIPs with good range of motion with no synovitis.  CDAI Exam: CDAI Score: -- Patient Global: --; Provider Global: -- Swollen: --; Tender: -- Joint Exam 12/14/2019   No joint exam has been documented for this visit   There is currently no information documented on the homunculus. Go to the Rheumatology activity and complete the homunculus joint exam.  Investigation: No additional findings.  Imaging: XR Hand 2 View Left  Result Date:  11/16/2019 No MCP, PIP or DIP narrowing was noted.  No intercarpal radiocarpal joint space narrowing was noted.  No erosive changes were noted. Impression: Unremarkable x-ray of the hand.  XR Hand 2 View Right  Result Date: 11/16/2019 No MCP, PIP or DIP narrowing was noted.  No intercarpal radiocarpal joint space narrowing was noted.  No erosive changes were noted. Impression: Unremarkable x-ray of the hand.  XR KNEE 3 VIEW LEFT  Result Date: 11/16/2019 No medial lateral compartment narrowing was noted.  No patellofemoral narrowing was noted.  No chondrocalcinosis was noted. Impression: Unremarkable x-ray of the knee joint.  XR KNEE 3 VIEW RIGHT  Result Date: 11/16/2019 No medial or lateral compartment narrowing was noted.  No patellofemoral narrowing was noted.  No chondrocalcinosis was noted. Impression: Unremarkable x-ray of the knee joint.  XR Lumbar Spine 2-3 Views  Result Date: 11/16/2019 No significant disc space narrowing was noted.  Mild facet joint arthropathy was noted.  No SI joint sclerosis or narrowing was noted.  Impression: These findings are consistent with facet joint arthropathy.   Recent Labs: Lab Results  Component Value Date   WBC 7.6 09/25/2019   HGB 13.6 09/25/2019   PLT 287.0 09/25/2019   NA 136 11/16/2019   K 4.1 11/16/2019   CL 102 11/16/2019   CO2 24 11/16/2019   GLUCOSE 81 11/16/2019   BUN 11 11/16/2019   CREATININE 0.71 11/16/2019   BILITOT 0.3 11/16/2019   ALKPHOS 44 07/09/2019   AST 13 11/16/2019   ALT 11 11/16/2019   PROT 7.4 11/16/2019   ALBUMIN 4.4 07/09/2019   CALCIUM 9.8 11/16/2019   GFRAA 136 11/16/2019   November 16, 2019 lupus anticoagulant negative, beta-2 GP 1 -, anticardiolipin negative, ANA negative, ENA negative, C3-C4 normal, '14 3 3 '$ eta negative, ESR 2, CK 47, ANCA negative, MPO negative, serine proteinase 3 -  09/25/19: CCP<16, RF<14, CRP<1, ESR 2, HLA-B27-, Vitamin B12 212   Speciality Comments: No specialty comments  available.  Procedures:  No procedures performed Allergies: Zolpidem tartrate   Assessment / Plan:     Visit Diagnoses: Polyarthralgia-patient complains of intermittent joint discomfort when she develops rash.  She also gives history of intermittent swelling in her hands.  I have not noticed any synovitis during this visit in the previous visit.  All autoimmune work-up is negative.  I went over all the lab results in detail.    Arthropathy of lumbar facet joint-mild facet joint changes were noted in the lower lumbar region on the x-rays at the last visit.  She complains of lower back discomfort.  Myalgia -she continues to have muscle pain.  Her CK was normal.  She may have myofascial pain.  History of psoriasis - In the childhood.  Treated by Dr. Tonia Brooms with topical agents.   Rash - Episodic facial rash and rash on extremities.  She had pictures on her phone.  She has been having episodic rash with redness of the large part of her extremities.  She also has had rash on her face.  She states is not pruritic but feels warm.  We will refer her to dermatology and allergy.  She is quite frustrated about the situation.  She will be going out of town until mid June and wants appointment after that.  Dry mouth-she complains of dry mouth.  I believe her symptoms are coming from the medications she takes.  Increase fluid intake and over-the-counter products were discussed.   Fatigue-continues to have fatigue.  Paresthesias-she complains of paresthesias.  Per Dr. Regis Bill  we will order B12 and folate.  Patient declined to get B12 and folate at this was not covered by her insurance.  Exercise-induced asthma-she is on inhalers.  Substance abuse in remission [facility]) - She has not used alcohol or marijuana in the last 3 years.  History of bipolar disorder-she is on medications.  Diarrhea, unspecified type - 4-5 loose stools a day have been blood.  Without blood.  Tobacco user  Orders: Orders Placed  This Encounter  Procedures  . Ambulatory referral to Allergy  . Ambulatory referral to Dermatology   No orders of the defined types were placed in this encounter.   Face-to-face time spent with patient was 30 minutes. Greater than 50% of time was spent in counseling and coordination of care.  Follow-Up Instructions: Return if symptoms worsen or fail to improve, for Rash.   Bo Merino, MD  Note - This record has been created using Editor, commissioning.  Chart creation errors have been sought,  but may not always  have been located. Such creation errors do not reflect on  the standard of medical care.

## 2019-12-12 NOTE — Telephone Encounter (Signed)
I am not prescribing her adderall   nnor do I remember planning to  Please advise  Perhaps sent to incorrect provider

## 2019-12-13 ENCOUNTER — Other Ambulatory Visit: Payer: Self-pay

## 2019-12-13 ENCOUNTER — Encounter: Payer: Self-pay | Admitting: Internal Medicine

## 2019-12-13 ENCOUNTER — Telehealth (INDEPENDENT_AMBULATORY_CARE_PROVIDER_SITE_OTHER): Payer: BC Managed Care – PPO | Admitting: Internal Medicine

## 2019-12-13 VITALS — Ht 60.0 in | Wt 129.0 lb

## 2019-12-13 DIAGNOSIS — Z79899 Other long term (current) drug therapy: Secondary | ICD-10-CM

## 2019-12-13 DIAGNOSIS — E538 Deficiency of other specified B group vitamins: Secondary | ICD-10-CM

## 2019-12-13 DIAGNOSIS — J45990 Exercise induced bronchospasm: Secondary | ICD-10-CM | POA: Diagnosis not present

## 2019-12-13 MED ORDER — ALBUTEROL SULFATE HFA 108 (90 BASE) MCG/ACT IN AERS: 1.0000 | INHALATION_SPRAY | Freq: Four times a day (QID) | RESPIRATORY_TRACT | 1 refills | Status: DC | PRN

## 2019-12-13 NOTE — Progress Notes (Signed)
Virtual Visit via Video Note  I connected with@ on 12/13/19 at  9:00 AM EDT by a video enabled telemedicine application and verified that I am speaking with the correct person using two identifiers. Location patient: home Location provider: home office Persons participating in the virtual visit: patient, provider  WIth national recommendations  regarding COVID 19 pandemic   video visit is advised over in office visit for this patient.  Patient aware  of the limitations of evaluation and management by telemedicine and  availability of in person appointments. and agreed to proceed.   HPI: Candace Shaw presents for video visit  For med evaluation  Would like a refill of Albuterol  Last used a number of years ago  In hs pore exercise for resp sx poss eia .  No allergy sx but has been having  Times hard to take a deep breath mid chest and worse with laying down   No sig cough vomiting fever  Going on 2 weeks trip out [place] to [employer] [place] and wants to have albuterol in case needed.    Tobacco: Only ocass vaping at times    Still under eval rheum  No spec dx   b12 hasnt helped her sx and has had b12 injections   Still gets rededend ext and rashes   ROS: See pertinent positives and negatives per HPI.  Past Medical History:  Diagnosis Date  . Asthma   . Bipolar disorder ([place])   . Depression   . Hallucinogen dependence, unspecified 10/03/2012  . Headache(784.0)   . Obesity   . Personality disorder [facility])     Past Surgical History:  Procedure Laterality Date  . WISDOM TOOTH EXTRACTION  Age 89    Family History  Adopted: Yes  Problem Relation Age of Onset  . Alcohol abuse Mother   . Alcohol abuse Father     Social History   Tobacco Use  . Smoking status: Former Smoker    Packs/day: 0.50    Years: 4.00    Pack years: 2.00    Types: Cigarettes  . Smokeless tobacco: Never Used  Substance Use Topics  . Alcohol use: Not Currently  . Drug use: Not Currently     Types: Marijuana      Current Outpatient Medications:  .  amitriptyline (ELAVIL) 25 MG tablet, Take 12.5-25 mg by mouth at bedtime., Disp: , Rfl:  .  diphenhydramine-acetaminophen (TYLENOL PM) 25-500 MG TABS tablet, Take 1 tablet by mouth at bedtime as needed., Disp: , Rfl:  .  gabapentin (NEURONTIN) 300 MG capsule, Take 300 mg by mouth 3 (three) times daily., Disp: , Rfl:  .  GABAPENTIN PO, Take 900 mg by mouth at bedtime. , Disp: , Rfl:  .  Ibuprofen ([employer] IBUPROFEN) 200 MG CAPS, Take by mouth 2 (two) times daily., Disp: , Rfl:  .  influenza vac split quadrivalent PF (AFLURIA QUADRIVALENT) 0.5 ML injection, Afluria Qd 2020-21 (36 mos up)(PF)60 mcg (15 mcg x4)/0.5 mL IM syringe, Disp: , Rfl:  .  lamoTRIgine (LAMICTAL) 100 MG tablet, Take 300 mg by mouth at bedtime., Disp: , Rfl:  .  TRINTELLIX 10 MG TABS tablet, Take 10 mg by mouth daily., Disp: , Rfl:  .  VYVANSE 20 MG capsule, Take 20 mg by mouth daily., Disp: , Rfl:  .  ADDERALL XR 10 MG 24 hr capsule, Take 10 mg by mouth every morning., Disp: , Rfl:  .  albuterol (VENTOLIN HFA) 108 (90 Base) MCG/ACT inhaler, Inhale  1-2 puffs into the lungs every 6 (six) hours as needed for wheezing or shortness of breath (pre exercise)., Disp: 18 g, Rfl: 1 .  VRAYLAR 4.5 MG CAPS, Take 1 capsule by mouth daily., Disp: , Rfl:  .  VRAYLAR capsule, Take 3 mg by mouth daily., Disp: , Rfl:   EXAM: BP Readings from Last 3 Encounters:  11/16/19 116/73  09/25/19 100/60  07/09/19 118/68    VITALS per patient if applicable:  GENERAL: alert, oriented, appears well and in no acute distress  HEENT: atraumatic, conjunttiva clear, no obvious abnormalities on inspection of external nose and ears  NECK: normal movements of the head and neck  LUNGS: on inspection no signs of respiratory distress, breathing rate appears normal, no obvious gross SOB, gasping or wheezing  CV: no obvious cyanosis  MS: moves all visible extremities without noticeable  abnormality  PSYCH/NEURO: pleasant and cooperative, no obvious depression or anxiety, speech and thought processing grossly intact Lab Results  Component Value Date   WBC 7.6 09/25/2019   HGB 13.6 09/25/2019   HCT 40.5 09/25/2019   PLT 287.0 09/25/2019   GLUCOSE 81 11/16/2019   CHOL 176 07/09/2019   TRIG 75.0 07/09/2019   HDL 52.90 07/09/2019   LDLCALC 108 (H) 07/09/2019   ALT 11 11/16/2019   AST 13 11/16/2019   NA 136 11/16/2019   K 4.1 11/16/2019   CL 102 11/16/2019   CREATININE 0.71 11/16/2019   BUN 11 11/16/2019   CO2 24 11/16/2019   TSH 1.79 07/09/2019   HGBA1C 5.2 07/09/2019    ASSESSMENT AND PLAN:  Discussed the following assessment and plan:    ICD-10-CM   1. Medication management  Z79.899    refill abluterol exp managment   2. Exercise-induced asthma probably  J45.990    see note not confirmed  helpd with sx  when younger  3. Low vitamin B12 level  E53.8    has had injections  and no change sx  can swith to otc 1000 mcg sl per day and is due for b12 level  ( can ask at next blood draw)     Counseled.   Expectant management and discussion of plan and treatment with opportunity to ask questions and all were answered. The patient agreed with the plan and demonstrated an understanding of the instructions.   Advised to call back or seek an in-person evaluation if worsening  or having  further concerns . Return if symptoms worsen or fail to improve, for or due needed , medication. Shanon Ace, MD

## 2019-12-14 ENCOUNTER — Ambulatory Visit: Payer: BC Managed Care – PPO | Admitting: Rheumatology

## 2019-12-14 ENCOUNTER — Encounter: Payer: Self-pay | Admitting: Rheumatology

## 2019-12-14 ENCOUNTER — Other Ambulatory Visit: Payer: Self-pay

## 2019-12-14 VITALS — BP 113/81 | HR 78 | Resp 14 | Ht 61.5 in | Wt 138.2 lb

## 2019-12-14 DIAGNOSIS — M2550 Pain in unspecified joint: Secondary | ICD-10-CM | POA: Diagnosis not present

## 2019-12-14 DIAGNOSIS — F1911 Other psychoactive substance abuse, in remission: Secondary | ICD-10-CM

## 2019-12-14 DIAGNOSIS — R682 Dry mouth, unspecified: Secondary | ICD-10-CM

## 2019-12-14 DIAGNOSIS — Z8659 Personal history of other mental and behavioral disorders: Secondary | ICD-10-CM

## 2019-12-14 DIAGNOSIS — M47816 Spondylosis without myelopathy or radiculopathy, lumbar region: Secondary | ICD-10-CM

## 2019-12-14 DIAGNOSIS — M791 Myalgia, unspecified site: Secondary | ICD-10-CM

## 2019-12-14 DIAGNOSIS — Z720 Tobacco use: Secondary | ICD-10-CM

## 2019-12-14 DIAGNOSIS — R21 Rash and other nonspecific skin eruption: Secondary | ICD-10-CM

## 2019-12-14 DIAGNOSIS — J45990 Exercise induced bronchospasm: Secondary | ICD-10-CM

## 2019-12-14 DIAGNOSIS — Z872 Personal history of diseases of the skin and subcutaneous tissue: Secondary | ICD-10-CM

## 2019-12-14 DIAGNOSIS — R197 Diarrhea, unspecified: Secondary | ICD-10-CM

## 2019-12-14 DIAGNOSIS — R5383 Other fatigue: Secondary | ICD-10-CM

## 2019-12-14 DIAGNOSIS — R202 Paresthesia of skin: Secondary | ICD-10-CM

## 2019-12-17 DIAGNOSIS — F603 Borderline personality disorder: Secondary | ICD-10-CM | POA: Diagnosis not present

## 2019-12-20 DIAGNOSIS — F509 Eating disorder, unspecified: Secondary | ICD-10-CM | POA: Diagnosis not present

## 2019-12-20 DIAGNOSIS — F3132 Bipolar disorder, current episode depressed, moderate: Secondary | ICD-10-CM | POA: Diagnosis not present

## 2019-12-20 DIAGNOSIS — F603 Borderline personality disorder: Secondary | ICD-10-CM | POA: Diagnosis not present

## 2019-12-20 DIAGNOSIS — F902 Attention-deficit hyperactivity disorder, combined type: Secondary | ICD-10-CM | POA: Diagnosis not present

## 2019-12-31 DIAGNOSIS — F603 Borderline personality disorder: Secondary | ICD-10-CM | POA: Diagnosis not present

## 2020-01-10 DIAGNOSIS — F603 Borderline personality disorder: Secondary | ICD-10-CM | POA: Diagnosis not present

## 2020-01-14 DIAGNOSIS — F603 Borderline personality disorder: Secondary | ICD-10-CM | POA: Diagnosis not present

## 2020-01-21 DIAGNOSIS — F603 Borderline personality disorder: Secondary | ICD-10-CM | POA: Diagnosis not present

## 2020-01-24 DIAGNOSIS — J00 Acute nasopharyngitis [common cold]: Secondary | ICD-10-CM | POA: Diagnosis not present

## 2020-01-24 DIAGNOSIS — J029 Acute pharyngitis, unspecified: Secondary | ICD-10-CM | POA: Diagnosis not present

## 2020-01-28 DIAGNOSIS — F603 Borderline personality disorder: Secondary | ICD-10-CM | POA: Diagnosis not present

## 2020-02-04 DIAGNOSIS — F603 Borderline personality disorder: Secondary | ICD-10-CM | POA: Diagnosis not present

## 2020-02-12 DIAGNOSIS — F603 Borderline personality disorder: Secondary | ICD-10-CM | POA: Diagnosis not present

## 2020-02-18 DIAGNOSIS — F603 Borderline personality disorder: Secondary | ICD-10-CM | POA: Diagnosis not present

## 2020-02-20 DIAGNOSIS — F902 Attention-deficit hyperactivity disorder, combined type: Secondary | ICD-10-CM | POA: Diagnosis not present

## 2020-02-20 DIAGNOSIS — F603 Borderline personality disorder: Secondary | ICD-10-CM | POA: Diagnosis not present

## 2020-02-20 DIAGNOSIS — F509 Eating disorder, unspecified: Secondary | ICD-10-CM | POA: Diagnosis not present

## 2020-02-20 DIAGNOSIS — F3132 Bipolar disorder, current episode depressed, moderate: Secondary | ICD-10-CM | POA: Diagnosis not present

## 2020-02-21 DIAGNOSIS — F603 Borderline personality disorder: Secondary | ICD-10-CM | POA: Diagnosis not present

## 2020-02-25 DIAGNOSIS — F603 Borderline personality disorder: Secondary | ICD-10-CM | POA: Diagnosis not present

## 2020-02-26 DIAGNOSIS — L539 Erythematous condition, unspecified: Secondary | ICD-10-CM | POA: Diagnosis not present

## 2020-03-03 DIAGNOSIS — F603 Borderline personality disorder: Secondary | ICD-10-CM | POA: Diagnosis not present

## 2020-03-05 DIAGNOSIS — L539 Erythematous condition, unspecified: Secondary | ICD-10-CM | POA: Diagnosis not present

## 2020-03-10 DIAGNOSIS — F603 Borderline personality disorder: Secondary | ICD-10-CM | POA: Diagnosis not present

## 2020-03-17 DIAGNOSIS — F603 Borderline personality disorder: Secondary | ICD-10-CM | POA: Diagnosis not present

## 2020-03-20 ENCOUNTER — Other Ambulatory Visit: Payer: Self-pay

## 2020-03-20 ENCOUNTER — Ambulatory Visit: Payer: BC Managed Care – PPO | Admitting: Allergy & Immunology

## 2020-03-20 ENCOUNTER — Encounter: Payer: Self-pay | Admitting: Allergy & Immunology

## 2020-03-20 VITALS — BP 102/60 | HR 83 | Temp 98.6°F | Resp 16 | Ht 61.0 in | Wt 132.2 lb

## 2020-03-20 DIAGNOSIS — J302 Other seasonal allergic rhinitis: Secondary | ICD-10-CM

## 2020-03-20 DIAGNOSIS — K9049 Malabsorption due to intolerance, not elsewhere classified: Secondary | ICD-10-CM

## 2020-03-20 DIAGNOSIS — R232 Flushing: Secondary | ICD-10-CM | POA: Diagnosis not present

## 2020-03-20 DIAGNOSIS — J3089 Other allergic rhinitis: Secondary | ICD-10-CM | POA: Diagnosis not present

## 2020-03-20 DIAGNOSIS — J45990 Exercise induced bronchospasm: Secondary | ICD-10-CM | POA: Diagnosis not present

## 2020-03-20 NOTE — Progress Notes (Signed)
NEW PATIENT  Date of Service/Encounter:  03/20/20  Referring provider: Burnis Medin, MD   Assessment:   Exercise-induced asthma  Seasonal and perennial allergic rhinitis (weeds, indoor molds, dust mites and cat)  Food intolerance - with negative testing to the entire panel  Flushing with perceived heart rate changes -   Plan/Recommendations:   1. Exercise-induced asthma - We did not do lung testing today. - Continue with albuterol as needed. - I do not think that you need a controller medication at this time.   2. Chronic rhinitis - Testing today showed: weeds, indoor molds, dust mites and cat - Copy of test results provided.  - Avoidance measures provided. - Continue with: Benadryl as needed - You can use an extra dose of the antihistamine, if needed, for breakthrough symptoms.  - Consider nasal saline rinses 1-2 times daily to remove allergens from the nasal cavities as well as help with mucous clearance (this is especially helpful to do before the nasal sprays are given) - I do not think that allergy shots are needed at this time.   3. Food intolerance - Testing was negative to Peanut, Soy, Wheat, Sesame, Milk, Egg, Casein, Shellfish Mix , Fish Mix, Cashew, [place], Walnut, [place], [place], [place] nut, [place], [place], [place], [place], [place], [place], [place], [place], [place], [place], [place], [place], [place], [place], [place], Oat, Rye, Hops, [employer], [place], Saccharomycese Cerevisiae, [place], [place], Chicken, [place], [place], Tomato, White Potato, Sweet Potato, Green Pea, [employer], Mushroom, Avocado, Onion, Cabbage, Carrots, Celery, Corn, Cucumber, Grape, Orange, Banana, Apple, Peach, Strawberry, Cantaloupe, Watermelon , Pineapple, Chocolate, Karaya Gum, Acacia (Arabic Gum), Cinnamon , Nutmeg, Ginger, Garlic, Black Pepper and Mustard - Testing today ruled out IgE medicated anaphylactic reactions to foods, but it does not rule out intolerances. - There is a the low positive  predictive value of food allergy testing and hence the high possibility of false positives. - In contrast, food allergy testing has a high negative predictive value, therefore if testing is negative we can be relatively assured that they are indeed negative.   4. Flushing - I am unsure what is going on with you, but we will get some lab work to rule out carcinoid syndrome and pheochromocytoma.  - I am going to refer you to see Cardiology to rule out POTS.  5. Return in about 3 months (around 06/20/2020). This can be an in-person, a virtual Webex or a telephone follow up visit.   Subjective:   Candace Shaw is a 24 y.o. female presenting today for evaluation of  Chief Complaint  Patient presents with  . Rash    everywhere    Candace Shaw has a history of the following: Patient Active Problem List   Diagnosis Date Noted  . Snake bite 02/16/2018  . Acute lymphadenitis of lower extremity 02/16/2018  . Nonallopathic lesion of thoracic region 06/30/2016  . Nonallopathic lesion of rib cage 06/30/2016  . Nonallopathic lesion of cervical region 06/30/2016  . Scapular dyskinesis 04/29/2016  . Subacromial bursitis 04/29/2016  . Substance abuse in remission ([place]) 03/28/2015  . Headache(784.0) 12/14/2013  . OCP (oral contraceptive pills) initiation 08/15/2013  . Smoker 02/13/2013  . Depression 12/05/2012  . Tobacco user 08/19/2012  . Exercise-induced asthma 04/03/2011    History obtained from: chart review and patient.  Candace Shaw was referred by Burnis Medin, MD and Dr. Estanislado Pandy.   Candace Shaw is a 24 y.o. female presenting for an evaluation of a multitude of complaints, but mostly centered  on a rash.   She has been seen by Dr. Estanislado Pandy. She has had this rash since she was a kid but it has worsened over the past ocuple of years. She sleeps with ice packs in her bed. It feels like a sunburn. She is unsure of any triggers but maybe the sunlight makes it worse.  Physical activity can make it worse. It sticks around for 20 minutes up to two hours. It will occur very sporadically. Review of the pictures on her phone shows that it appears to be more like flushing rather than urticaria since they do not appear raised at all. The rash does not consistently itch. She does think that she might have some heart rate changes (during the visit, she discusses a possible diagnosis of POTS). She has not seen a Cardiologist.    Asthma/Respiratory Symptom History: She reports that she goes through an inhaler every 2-3 months. Physical activity sand altitutde can trigger her symptoms. She has not ever needed prednisone for her breathing.    Allergic Rhinitis Symptom History: She does report that she has some allergy symptoms to her animals, but it is never enough to do anything about. She has never been tested at all in the past. She has never been on allergy shots.   Food Allergy Symptom History: She is vegan now and she feels that dairy causes some issues. She has a lot GI issues with any particular food. She reports daily diarrhea and bloating. She did take IB Guard, an OTC medication, that helps occasionally. She does have nausea. She did have some disordered eating type of bhaviors that are not "related to disordered thinking". She has had thius disordered eating since October 2020 but this has resolved with the therapy and the psychiatist. She has never had a reaction that is life threatening. When she was younger she had a reaction to an antibiotic.   Eczema Symptom History: She does have some elbow dryness. As a child, she had psoriasis. She does have a history of urticaria as well, which are distinct from her flushing. She thinks that stress might be a trigger of her urticaria. She would get them as a child when she was rolling around in the grass.   Otherwise, there is no history of other atopic diseases, including food allergies, drug allergies, stinging insect  allergies, eczema or contact dermatitis. There is no significant infectious history. Vaccinations are up to date.    Past Medical History: Patient Active Problem List   Diagnosis Date Noted  . Snake bite 02/16/2018  . Acute lymphadenitis of lower extremity 02/16/2018  . Nonallopathic lesion of thoracic region 06/30/2016  . Nonallopathic lesion of rib cage 06/30/2016  . Nonallopathic lesion of cervical region 06/30/2016  . Scapular dyskinesis 04/29/2016  . Subacromial bursitis 04/29/2016  . Substance abuse in remission ([place]) 03/28/2015  . Headache(784.0) 12/14/2013  . OCP (oral contraceptive pills) initiation 08/15/2013  . Smoker 02/13/2013  . Depression 12/05/2012  . Tobacco user 08/19/2012  . Exercise-induced asthma 04/03/2011    Medication List:  Allergies as of 03/20/2020      Reactions   Zolpidem Tartrate Other (See Comments)   Hallucinations      Medication List       Accurate as of March 20, 2020 11:59 PM. If you have any questions, ask your nurse or doctor.        Afluria Quadrivalent 0.5 ML injection Generic drug: influenza vac split quadrivalent PF Afluria Qd 2020-21 (36 mos up)(PF)60 mcg (  15 mcg x4)/0.5 mL IM syringe   albuterol 108 (90 Base) MCG/ACT inhaler Commonly known as: VENTOLIN HFA Inhale 1-2 puffs into the lungs every 6 (six) hours as needed for wheezing or shortness of breath (pre exercise).   amitriptyline 25 MG tablet Commonly known as: ELAVIL Take 12.5-25 mg by mouth at bedtime.   [facility] Ibuprofen 200 MG Caps Generic drug: Ibuprofen Take by mouth 2 (two) times daily.   diphenhydramine-acetaminophen 25-500 MG Tabs tablet Commonly known as: TYLENOL PM Take 1 tablet by mouth at bedtime as needed.   GABAPENTIN PO Take 900 mg by mouth at bedtime.   gabapentin 300 MG capsule Commonly known as: NEURONTIN Take 300 mg by mouth 3 (three) times daily.   lamoTRIgine 100 MG tablet Commonly known as: LAMICTAL Take 300 mg by mouth at bedtime.    Trintellix 10 MG Tabs tablet Generic drug: vortioxetine HBr Take 10 mg by mouth daily.   Vraylar 4.5 MG Caps Generic drug: Cariprazine HCl Take 1 capsule by mouth daily.   Vyvanse 20 MG capsule Generic drug: lisdexamfetamine Take 20 mg by mouth daily.       Birth History: non-contributory  Developmental History: non-contributory  Past Surgical History: Past Surgical History:  Procedure Laterality Date  . WISDOM TOOTH EXTRACTION  Age 88     Family History: Family History  Adopted: Yes  Problem Relation Age of Onset  . Alcohol abuse Mother   . Alcohol abuse Father      Social History: Kennedy lives in an apartment that is 31+ years old.  There is vinyl flooring throughout the home.  She has electric heating and window units for cooling. She is getting a degree in [place]. She is hoping to transfer to [place] next year. She is hoping to finish her Bachelor or Masters in 2-3 years.  She currently lives with a dog and a cat inside of the home.  There are no dust mite covers on the bedding.  She is a smoker for the past 3 years, but vapes instead of smoking cigarettes.  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   Review of Systems  Constitutional: Negative.  Negative for chills, fever, malaise/fatigue and weight loss.  HENT: Positive for congestion and sinus pain. Negative for ear discharge and ear pain.   Eyes: Negative for pain, discharge and redness.  Respiratory: Negative for cough, sputum production, shortness of breath and wheezing.   Cardiovascular: Negative.  Negative for chest pain and palpitations.  Gastrointestinal: Negative for abdominal pain, constipation, diarrhea, heartburn, nausea and vomiting.  Skin: Positive for rash. Negative for itching.  Neurological: Negative for dizziness and headaches.  Endo/Heme/Allergies: Positive for environmental allergies. Does not bruise/bleed easily.       Objective:   Blood pressure  102/60, pulse 83, temperature 98.6 F (37 C), temperature source Temporal, resp. rate 16, height 5\' 1"  (1.549 m), weight 132 lb 3.2 oz (60 kg), SpO2 99 %. Body mass index is 24.98 kg/m.   Physical Exam:   Physical Exam Constitutional:      Appearance: She is well-developed.     Comments: Very pleasant female. Talkative.   HENT:     Head: Normocephalic and atraumatic.     Right Ear: Tympanic membrane, ear canal and external ear normal. No drainage, swelling or tenderness. Tympanic membrane is not injected, scarred, erythematous, retracted or bulging.     Left Ear: Tympanic membrane, ear canal and external ear normal. No drainage, swelling  or tenderness. Tympanic membrane is not injected, scarred, erythematous, retracted or bulging.     Nose: No nasal deformity, septal deviation, mucosal edema or rhinorrhea.     Right Turbinates: Enlarged, swollen and pale.     Left Turbinates: Enlarged, swollen and pale.     Right Sinus: No maxillary sinus tenderness or frontal sinus tenderness.     Left Sinus: No maxillary sinus tenderness or frontal sinus tenderness.     Mouth/Throat:     Mouth: Mucous membranes are not pale and not dry.     Pharynx: Uvula midline.  Eyes:     General:        Right eye: No discharge.        Left eye: No discharge.     Conjunctiva/sclera: Conjunctivae normal.     Right eye: Right conjunctiva is not injected. No chemosis.    Left eye: Left conjunctiva is not injected. No chemosis.    Pupils: Pupils are equal, round, and reactive to light.  Cardiovascular:     Rate and Rhythm: Normal rate and regular rhythm.     Heart sounds: Normal heart sounds.  Pulmonary:     Effort: Pulmonary effort is normal. No tachypnea, accessory muscle usage or respiratory distress.     Breath sounds: Normal breath sounds. No wheezing, rhonchi or rales.     Comments: Moving air well in all lung fields. No increased work of breathing noted.  Chest:     Chest wall: No tenderness.   Abdominal:     Tenderness: There is no abdominal tenderness. There is no guarding or rebound.  Lymphadenopathy:     Head:     Right side of head: No submandibular, tonsillar or occipital adenopathy.     Left side of head: No submandibular, tonsillar or occipital adenopathy.     Cervical: No cervical adenopathy.  Skin:    Coloration: Skin is not pale.     Findings: No abrasion, erythema, petechiae or rash. Rash is not papular, urticarial or vesicular.     Comments: No eczematous lesions noted. She does have some flat macular erythematous areas on her arms and legs.   Neurological:     Mental Status: She is alert.  Psychiatric:        Behavior: Behavior is cooperative.      Diagnostic studies:   Allergy Studies:     Airborne Adult Perc - 03/20/20 1433    Time Antigen Placed 1433    Allergen Manufacturer Lavella Hammock    Location Back    Number of Test 59    Panel 1 Select    1. Control-Buffer 50% Glycerol Negative    2. Control-Histamine 1 mg/ml 2+    3. Albumin saline Negative    4. [place] Negative    5. [place] Negative    6. Johnson Negative    7. [place] Blue Negative    8. Meadow Fescue Negative    9. Perennial Rye Negative    10. Sweet Vernal Negative    11. Timothy Negative    12. Cocklebur Negative    13. Burweed Marshelder Negative    14. Ragweed, short Negative    15. Ragweed, Giant Negative    16. Plantain,  English 2+    17. Lamb's Quarters Negative    18. Sheep Sorrell Negative    19. Rough Pigweed Negative    20. Marsh Elder, Rough Negative    21. Mugwort, Common Negative    22. Ash mix Negative  23Wendee Copp mix Negative    24. Beech American Negative    25. Box, Elder Negative    26. Cedar, red Negative    27. Cottonwood, [place] Negative    28. Elm mix Negative    29. Hickory Negative    30. Maple mix Negative    31. Oak, [place] mix Negative    32. Pecan Pollen Negative    33. Pine mix Negative    34. Sycamore Eastern Negative    35. [place],  Black Pollen Negative    36. Alternaria alternata Negative    37. Cladosporium Herbarum Negative    38. Aspergillus mix Negative    39. Penicillium mix Negative    40. Bipolaris sorokiniana (Helminthosporium) Negative    41. Drechslera spicifera (Curvularia) Negative    42. Mucor plumbeus Negative    43. Fusarium moniliforme Negative    44. Aureobasidium pullulans (pullulara) Negative    45. Rhizopus oryzae Negative    46. Botrytis cinera 2+    47. Epicoccum nigrum Negative    48. Phoma betae Negative    49. Candida Albicans Negative    50. Trichophyton mentagrophytes Negative    51. Mite, D Farinae  5,000 AU/ml 4+    52. Mite, D Pteronyssinus  5,000 AU/ml 4+    53. Cat Hair 10,000 BAU/ml 2+    54.  Dog Epithelia Negative    55. Mixed Feathers Negative    56. Horse Epithelia Negative    57. Cockroach, German Negative    58. Mouse Negative    59. Tobacco Leaf Negative          Intradermal - 03/20/20 1453    Time Antigen Placed 1453    Allergen Manufacturer Lavella Hammock    Location Arm    Number of Test 13    Control Negative    [place] Negative    Johnson Negative    7 Grass Negative    Ragweed mix Negative    Weed mix Negative    Tree mix Negative    Mold 1 Negative    Mold 2 Negative    Mold 3 Negative    Mold 4 Negative    Dog Negative    Cockroach Negative          Food Adult Perc - 03/20/20 1400    Time Antigen Placed 1434    Allergen Manufacturer [place]    Location Back    Number of allergen test 72    1. Peanut Negative    2. Soybean Negative    3. Wheat Negative    4. Sesame Negative    5. Milk, cow Negative    6. Egg White, Chicken Negative    7. Casein Negative    8. Shellfish Mix Negative    9. Fish Mix Negative    10. Cashew Negative    11. Pecan Food Negative    12. Sunbright Negative    13. Almond Negative    14. Hazelnut Negative    15. Bol[place] nut Negative    16. Coconut Negative    17. Pistachio Negative    18. Catfish Negative    19.  Bass Negative    20. Trout Negative    21. Tuna Negative    22. Salmon Negative    23. Flounder Negative    24. Codfish Negative    25. Shrimp Negative    26. Crab Negative    27. Lobster Negative    28. Oyster Negative  29. Scallops Negative    30. Barley Negative    31. Oat  Negative    32. Rye  Negative    33. Hops Negative    34. Rice Negative    35. Cottonseed Negative    36. Saccharomyces Cerevisiae  Negative    37. Pork Negative    38. [place] Meat Negative    39. Chicken Meat Negative    40. Beef Negative    41. Lamb Negative    42. Tomato Negative    43. White Potato Negative    44. Sweet Potato Negative    45. Pea, Green/English Negative    46. Navy Bean Negative    47. Mushrooms Negative    48. Avocado Negative    49. Onion Negative    50. Cabbage Negative    51. Carrots Negative    52. Celery Negative    53. Corn Negative    54. Cucumber Negative    55. Grape (White seedless) Negative    56. Orange  Negative    57. Banana Negative    58. Apple Negative    59. Peach Negative    60. Strawberry Negative    61. Cantaloupe Negative    62. Watermelon Negative    63. Pineapple Negative    64. Chocolate/Cacao bean Negative    65. Karaya Gum Negative    66. Acacia (Arabic Gum) Negative    67. Cinnamon Negative    68. Nutmeg Negative    69. Ginger Negative    70. Garlic Negative    71. Pepper, black Negative    72. Mustard Negative           Allergy testing results were read and interpreted by myself, documented by clinical staff.         Salvatore Marvel, MD [facility]

## 2020-03-20 NOTE — Patient Instructions (Addendum)
1. Exercise-induced asthma - We did not do lung testing today. - Continue with albuterol as needed. - I do not think that you need a controller medication at this time.   2. Chronic rhinitis - Testing today showed: weeds, indoor molds, dust mites and cat - Copy of test results provided.  - Avoidance measures provided. - Continue with: Benadryl as needed - You can use an extra dose of the antihistamine, if needed, for breakthrough symptoms.  - Consider nasal saline rinses 1-2 times daily to remove allergens from the nasal cavities as well as help with mucous clearance (this is especially helpful to do before the nasal sprays are given) - I do not think that allergy shots are needed at this time.   3. Food intolerance - Testing was negative to Peanut, Soy, Wheat, Sesame, Milk, Egg, Casein, Shellfish Mix , Fish Mix, Cashew, [place], [place], [place], [place], [place] nut, [place], [place], [employer], [place], [place], [place], [place], [place], [place], [place], [place], [place], [place], [place], [place], Oat, Rye, Hops, [employer], [place], Saccharomycese Cerevisiae, [place], [place], Chicken, [place], [place], Tomato, White Potato, Sweet Potato, Green Pea, [employer], Mushroom, Avocado, Onion, Cabbage, Carrots, Celery, Corn, Cucumber, Grape, Orange, Banana, Apple, Peach, Strawberry, Cantaloupe, Watermelon , Pineapple, Chocolate, Karaya Gum, Acacia (Arabic Gum), Cinnamon , Nutmeg, Ginger, Garlic, Black Pepper and Mustard - Testing today ruled out IgE medicated anaphylactic reactions to foods, but it does not rule out intolerances. - There is a the low positive predictive value of food allergy testing and hence the high possibility of false positives. - In contrast, food allergy testing has a high negative predictive value, therefore if testing is negative we can be relatively assured that they are indeed negative.   4. Flushing - I am unsure what is going on with you, but we will get some lab work to rule out carcinoid syndrome  and pheochromocytoma.  - I am going to refer you to see Cardiology to rule out POTS.  5. Return in about 3 months (around 06/20/2020). This can be an in-person, a virtual Webex or a telephone follow up visit.   Please inform us of any Emergency Department visits, hospitalizations, or changes in symptoms. Call us before going to the ED for breathing or allergy symptoms since we might be able to fit you in for a sick visit. Feel free to contact us anytime with any questions, problems, or concerns.  It was a pleasure to meet you today!  Websites that have reliable patient information: 1. American Academy of Asthma, Allergy, and Immunology: www.aaaai.org 2. Food Allergy Research and Education (FARE): foodallergy.org 3. Mothers of Asthmatics: http://www.asthmacommunitynetwork.org 4. American College of Allergy, Asthma, and Immunology: www.acaai.org   COVID-19 Vaccine Information can be found at: ShippingScam.co.uk For questions related to vaccine distribution or appointments, please email [email or web address] or call [phone].     "Like" [place] on Facebook and Instagram for our latest updates!        Make sure you are registered to vote! If you have moved or changed any of your contact information, you will need to get this updated before voting!  In some cases, you MAY be able to register to vote online: [email or web address]    Control of Dust Mite Allergen    Dust mites play a major role in allergic asthma and rhinitis.  They occur in environments with high humidity wherever human skin is found.  Dust mites absorb humidity from the atmosphere (ie, they do not drink) and feed on organic matter (including shed human and  animal skin).  Dust mites are a microscopic type of insect that you cannot see with the naked eye.  High levels of dust mites have been detected from mattresses, pillows, carpets,  upholstered furniture, bed covers, clothes, soft toys and any woven material.  The principal allergen of the dust mite is found in its feces.  A gram of dust may contain 1,000 mites and 250,000 fecal particles.  Mite antigen is easily measured in the air during house cleaning activities.  Dust mites do not bite and do not cause harm to humans, other than by triggering allergies/asthma.    Ways to decrease your exposure to dust mites in your home:  1. Encase mattresses, box springs and pillows with a mite-impermeable barrier or cover   2. Wash sheets, blankets and drapes weekly in hot water (130 F) with detergent and dry them in a dryer on the hot setting.  3. Have the room cleaned frequently with a vacuum cleaner and a damp dust-mop.  For carpeting or rugs, vacuuming with a vacuum cleaner equipped with a high-efficiency particulate air (HEPA) filter.  The dust mite allergic individual should not be in a room which is being cleaned and should wait 1 hour after cleaning before going into the room. 4. Do not sleep on upholstered furniture (eg, couches).   5. If possible removing carpeting, upholstered furniture and drapery from the home is ideal.  Horizontal blinds should be eliminated in the rooms where the person spends the most time (bedroom, study, television room).  Washable vinyl, roller-type shades are optimal. 6. Remove all non-washable stuffed toys from the bedroom.  Wash stuffed toys weekly like sheets and blankets above.   7. Reduce indoor humidity to less than 50%.  Inexpensive humidity monitors can be purchased at most hardware stores.  Do not use a humidifier as can make the problem worse and are not recommended.  Reducing Pollen Exposure  The American Academy of Allergy, Asthma and Immunology suggests the following steps to reduce your exposure to pollen during allergy seasons.    1. Do not hang sheets or clothing out to dry; pollen may collect on these items. 2. Do not mow lawns or  spend time around freshly cut grass; mowing stirs up pollen. 3. Keep windows closed at night.  Keep car windows closed while driving. 4. Minimize morning activities outdoors, a time when pollen counts are usually at their highest. 5. Stay indoors as much as possible when pollen counts or humidity is high and on windy days when pollen tends to remain in the air longer. 6. Use air conditioning when possible.  Many air conditioners have filters that trap the pollen spores. 7. Use a HEPA room air filter to remove pollen form the indoor air you breathe.  Control of Mold Allergen   Mold and fungi can grow on a variety of surfaces provided certain temperature and moisture conditions exist.  Outdoor molds grow on plants, decaying vegetation and soil.  The major outdoor mold, Alternaria and Cladosporium, are found in very high numbers during hot and dry conditions.  Generally, a late Summer - Fall peak is seen for common outdoor fungal spores.  Rain will temporarily lower outdoor mold spore count, but counts rise rapidly when the rainy period ends.  The most important indoor molds are Aspergillus and Penicillium.  Dark, humid and poorly ventilated basements are ideal sites for mold growth.  The next most common sites of mold growth are the bathroom and the kitchen.  Indoor (Perennial) Mold Control   Positive indoor molds via skin testing: Botrytis  1. Maintain humidity below 50%. 2. Clean washable surfaces with 5% bleach solution. 3. Remove sources e.g. contaminated carpets.    Control of Dog or Cat Allergen  Avoidance is the best way to manage a dog or cat allergy. If you have a dog or cat and are allergic to dog or cats, consider removing the dog or cat from the home. If you have a dog or cat but don't want to find it a new home, or if your family wants a pet even though someone in the household is allergic, here are some strategies that may help keep symptoms at bay:  1. Keep the pet out of  your bedroom and restrict it to only a few rooms. Be advised that keeping the dog or cat in only one room will not limit the allergens to that room. 2. Don't pet, hug or kiss the dog or cat; if you do, wash your hands with soap and water. 3. High-efficiency particulate air (HEPA) cleaners run continuously in a bedroom or living room can reduce allergen levels over time. 4. Regular use of a high-efficiency vacuum cleaner or a central vacuum can reduce allergen levels. 5. Giving your dog or cat a bath at least once a week can reduce airborne allergen.

## 2020-03-23 ENCOUNTER — Encounter: Payer: Self-pay | Admitting: Allergy & Immunology

## 2020-03-24 DIAGNOSIS — F509 Eating disorder, unspecified: Secondary | ICD-10-CM | POA: Diagnosis not present

## 2020-03-24 DIAGNOSIS — F3132 Bipolar disorder, current episode depressed, moderate: Secondary | ICD-10-CM | POA: Diagnosis not present

## 2020-03-24 DIAGNOSIS — F603 Borderline personality disorder: Secondary | ICD-10-CM | POA: Diagnosis not present

## 2020-03-24 DIAGNOSIS — F902 Attention-deficit hyperactivity disorder, combined type: Secondary | ICD-10-CM | POA: Diagnosis not present

## 2020-03-27 LAB — ALPHA-GAL PANEL
Alpha Gal IgE*: <0.1 kU/L (ref <0.10)
Beef (Bos spp) IgE: <0.1 kU/L (ref <0.35)
Class Interpretation: 0
Class Interpretation: 0
Class Interpretation: 0
Lamb/Mutton (Ovis spp) IgE: <0.1 kU/L (ref <0.35)
Pork (Sus spp) IgE: <0.1 kU/L (ref <0.35)

## 2020-03-27 LAB — TRYPTASE: Tryptase: 3.1 ug/L (ref 2.2–13.2)

## 2020-03-27 LAB — PROTEIN ELECTROPHORESIS, SERUM
A/G Ratio: 1.3 (ref 0.7–1.7)
Albumin ELP: 4.3 g/dL (ref 2.9–4.4)
Alpha 1: 0.2 g/dL (ref 0.0–0.4)
Alpha 2: 0.6 g/dL (ref 0.4–1.0)
Beta: 1.1 g/dL (ref 0.7–1.3)
Gamma Globulin: 1.4 g/dL (ref 0.4–1.8)
Globulin, Total: 3.4 g/dL (ref 2.2–3.9)
Total Protein: 7.7 g/dL (ref 6.0–8.5)

## 2020-03-27 LAB — METANEPHRINES, PLASMA
Metanephrine, Free: 31.4 pg/mL (ref 0.0–88.0)
Normetanephrine, Free: 84.1 pg/mL (ref 0.0–107.7)

## 2020-03-28 DIAGNOSIS — R232 Flushing: Secondary | ICD-10-CM | POA: Diagnosis not present

## 2020-03-31 DIAGNOSIS — F603 Borderline personality disorder: Secondary | ICD-10-CM | POA: Diagnosis not present

## 2020-04-06 NOTE — Progress Notes (Signed)
Electrophysiology Office Note:    Date:  04/07/2020   ID:  DREANNA KYLLO, DOB [birth year], MRN [id number]  PCP:  Burnis Medin, MD  [place] Cardiologist:  No primary care provider on file.  [facility] Electrophysiologist:  None   Referring MD: Valentina Shaggy, *   Chief Complaint: flushing. ? POTS  History of Present Illness:    Candace Shaw is a 24 y.o. female with history of tobacco abuse, bipolar disorder, hx of EtOH/THC abuse who presents to the clinic for evaluation of flushing and possible POTS. She reports frequent episodes of flushing with some possible heart rate changes during some but not all episodes.  She has seen an allergist recently for evaluation of food intolerances but testing was not suggestive of food allergies. The patient reports episode of syncope a few years ago and then again in June. No personal injury. No reliable triggers.    Past Medical History:  Diagnosis Date  . Asthma   . Bipolar disorder ([place])   . Depression   . Hallucinogen dependence, unspecified 10/03/2012  . Headache(784.0)   . Obesity   . Personality disorder ([place])   . Urticaria     Past Surgical History:  Procedure Laterality Date  . WISDOM TOOTH EXTRACTION  Age 59    Current Medications: Current Meds  Medication Sig  . albuterol (VENTOLIN HFA) 108 (90 Base) MCG/ACT inhaler Inhale 1-2 puffs into the lungs every 6 (six) hours as needed for wheezing or shortness of breath (pre exercise).  Marland Kitchen diphenhydramine-acetaminophen (TYLENOL PM) 25-500 MG TABS tablet Take 1 tablet by mouth at bedtime as needed.  . gabapentin (NEURONTIN) 300 MG capsule Take 300 mg by mouth 3 (three) times daily.  Marland Kitchen GABAPENTIN PO Take 900 mg by mouth at bedtime.   . Ibuprofen (CVS IBUPROFEN) 200 MG CAPS Take by mouth 2 (two) times daily.   . influenza vac split quadrivalent PF (AFLURIA QUADRIVALENT) 0.5 ML injection Afluria Qd 2020-21 (36 mos up)(PF)60 mcg (15 mcg x4)/0.5 mL IM syringe   . lamoTRIgine (LAMICTAL) 100 MG tablet Take 300 mg by mouth at bedtime.  Marland Kitchen VYVANSE 20 MG capsule Take 20 mg by mouth daily.     Allergies:   Zolpidem tartrate   Social History   Socioeconomic History  . Marital status: Single    Spouse name: Not on file  . Number of children: Not on file  . Years of education: Not on file  . Highest education level: Not on file  Occupational History  . Occupation: coffee shop   Tobacco Use  . Smoking status: Former Smoker    Packs/day: 0.50    Years: 4.00    Pack years: 2.00    Types: Cigarettes    Quit date: 12/14/2018    Years since quitting: 1.3  . Smokeless tobacco: Never Used  Vaping Use  . Vaping Use: Every day  . Substances: Nicotine  Substance and Sexual Activity  . Alcohol use: Not Currently  . Drug use: Not Currently    Types: Marijuana  . Sexual activity: Not Currently    Partners: Female    Birth control/protection: Condom, OCP, Pill  Other Topics Concern  . 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 Difficulty of Paying Living Expenses: Not on file  Food Insecurity:   . Worried About Charity fundraiser in the Last Year: Not on file  . Ran Out of Food in the Last Year: Not on file  Transportation Needs:   . Lack of Transportation (Medical): Not on file  . Lack of Transportation (Non-Medical): Not on file  Physical Activity:   . Days of Exercise per Week: Not on file  . Minutes of Exercise per Session: Not on file  Stress:   . Feeling of Stress : Not on file  Social Connections:   . Frequency of Communication with Friends and Family: Not on file  . Frequency of Social Gatherings with Friends and Family: Not on file  . Attends Religious Services: Not on file  . Active Member of  Clubs or Organizations: Not on file  . Attends Archivist Meetings: Not on file  . Marital Status: Not on file     Family History: The patient's family history includes Alcohol abuse in her father and mother. She was adopted.  ROS:   Please see the history of present illness.    All other systems reviewed and are negative.  EKGs/Labs/Other Studies Reviewed:    The following studies were reviewed today: ECG, orthostatics  EKG:  The ekg ordered today demonstrates sinus rhythm at 78bpm.   01/31/2019 ECG shows sinus rhythm with rate of 90.  Recent Labs: 07/09/2019: TSH 1.79 09/25/2019: Hemoglobin 13.6; Platelets 287.0 11/16/2019: ALT 11; BUN 11; Creat 0.71; Potassium 4.1; Sodium 136  Recent Lipid Panel    Component Value Date/Time   CHOL 176 07/09/2019 1128   TRIG 75.0 07/09/2019 1128   HDL 52.90 07/09/2019 1128   CHOLHDL 3 07/09/2019 1128   VLDL 15.0 07/09/2019 1128   LDLCALC 108 (H) 07/09/2019 1128    Physical Exam:    VS:  BP 115/75   Pulse 78   Ht 5\' 1"  (1.549 m)   Wt 130 lb 9.6 oz (59.2 kg)   SpO2 97%   BMI 24.68 kg/m     Laying: 111/73 80 Sitting 115/75 79 Standing 117/80 89  Wt Readings from Last 3 Encounters:  04/07/20 130 lb 9.6 oz (59.2 kg)  03/20/20 132 lb 3.2 oz (60 kg)  12/14/19 138 lb 3.2 oz (62.7 kg)     GEN:  Well nourished, well developed in no acute distress HEENT: Normal NECK: No JVD; No carotid bruits LYMPHATICS: No lymphadenopathy CARDIAC: RRR, no murmurs, rubs, gallops RESPIRATORY:  Clear to auscultation without rales, wheezing or rhonchi  ABDOMEN: Soft, non-tender, non-distended MUSCULOSKELETAL:  No edema; No deformity  SKIN: Warm and dry. Tattoos.  NEUROLOGIC:  Alert and oriented x 3 PSYCHIATRIC:  Normal affect   ASSESSMENT:    1. Palpitations    PLAN:    In order of problems listed above:  1. Palpitations and Flushing The etiology of Candace Shaw's episodes of flushing and feelings of abnormal heart rates is  unclear. Her orthostatics and standing heart rates are not consistent with a diagnosis of POTS. ECG not consistent with preexcitation or baseline conduction abnormality. Exam not suggestive of structural heart disease. Plan for a 2 week holter to exclude SVT as an etiology of her sensations.    Medication Adjustments/Labs and Tests Ordered: Current medicines are reviewed at length with the patient today.  Concerns regarding medicines are outlined above.  Orders Placed This Encounter  Procedures  .  LONG TERM MONITOR (3-14 DAYS)  . EKG 12-Lead   No orders of the defined types were placed in this encounter.   Patient Instructions  Medication Instructions:  Your physician recommends that you continue on your current medications as directed. Please refer to the Current Medication list given to you today.  Labwork: None ordered.  Testing/Procedures: Your physician has recommended that you wear a holter monitor. Holter monitors are medical devices that record the heart's electrical activity.   You will wear a 14 day heart monitor  Follow-Up: Your physician wants you to follow-up based on results of your heart monitor.  Any Other Special Instructions Will Be Listed Below (If Applicable).  If you need a refill on your cardiac medications before your next appointment, please call your pharmacy.   ZIO XT- Long Term Monitor Instructions   Your physician has requested you wear your ZIO patch monitor_14_days.   This is a single patch monitor.  [employer] supplies one patch monitor per enrollment.  Additional stickers are not available.   Please do not apply patch if you will be having a Nuclear Stress Test, Echocardiogram, Cardiac CT, MRI, or Chest Xray during the time frame you would be wearing the monitor. The patch cannot be worn during these tests.  You cannot remove and re-apply the ZIO XT patch monitor.   Your ZIO patch monitor will be sent USPS Priority mail from [facility]  directly to your home address. The monitor may also be mailed to a PO BOX if home delivery is not available.   It may take 3-5 days to receive your monitor after you have been enrolled.   Once you have received you monitor, please review enclosed instructions.  Your monitor has already been registered assigning a specific monitor serial # to you.   Applying the monitor   Shave hair from upper left chest.   Hold abrader disc by orange tab.  Rub abrader in 40 strokes over left upper chest as indicated in your monitor instructions.   Clean area with 4 enclosed alcohol pads .  Use all pads to assure are is cleaned thoroughly.  Let dry.   Apply patch as indicated in monitor instructions.  Patch will be place under collarbone on left side of chest with arrow pointing upward.   Rub patch adhesive wings for 2 minutes.Remove white label marked "1".  Remove white label marked "2".  Rub patch adhesive wings for 2 additional minutes.   While looking in a mirror, press and release button in center of patch.  A small green light will flash 3-4 times .  This will be your only indicator the monitor has been turned on.     Do not shower for the first 24 hours.  You may shower after the first 24 hours.   Press button if you feel a symptom. You will hear a small click.  Record Date, Time and Symptom in the Patient Log Book.   When you are ready to remove patch, follow instructions on last 2 pages of Patient Log Book.  Stick patch monitor onto last page of Patient Log Book.   Place Patient Log Book in [place] box.  Use locking tab on box and tape box closed securely.  The [employer] and [employer] has [employer] on it.  Please place in mailbox as soon as possible.  Your physician should have your test results approximately 7 days after the monitor has been mailed back to [facility].   Call [employer] Customer Care at [phone] if you have questions regarding your ZIO XT patch monitor.  Call them  immediately if you see an orange light blinking on your monitor.   If your monitor falls off in less than 4 days contact our Monitor department at [phone].  If your monitor becomes loose or falls off after 4 days call [employer] at [phone] for suggestions on securing your monitor.       Signed, Vickie Epley, MD  04/07/2020 12:13 PM    [place]

## 2020-04-07 ENCOUNTER — Other Ambulatory Visit: Payer: Self-pay

## 2020-04-07 ENCOUNTER — Ambulatory Visit (INDEPENDENT_AMBULATORY_CARE_PROVIDER_SITE_OTHER): Payer: BC Managed Care – PPO | Admitting: Cardiology

## 2020-04-07 ENCOUNTER — Encounter: Payer: Self-pay | Admitting: Cardiology

## 2020-04-07 ENCOUNTER — Encounter: Payer: Self-pay | Admitting: *Deleted

## 2020-04-07 VITALS — BP 115/75 | HR 78 | Ht 61.0 in | Wt 130.6 lb

## 2020-04-07 DIAGNOSIS — F603 Borderline personality disorder: Secondary | ICD-10-CM | POA: Diagnosis not present

## 2020-04-07 DIAGNOSIS — R002 Palpitations: Secondary | ICD-10-CM

## 2020-04-07 LAB — CATECHOLAMINE+VMA, 24-HR URINE
Dopamine , 24H Ur: 307 ug/24 hr (ref 0–510)
Dopamine, Rand Ur: 116 ug/L
Epinephrine, 24H Ur: 5 ug/24 hr (ref 0–20)
Epinephrine, Rand Ur: 2 ug/L
Norepinephrine, 24H Ur: 32 ug/24 hr (ref 0–135)
Norepinephrine, Rand Ur: 12 ug/L
VMA, 24H Ur Adult: 3.2 mg/24 hr (ref 0.0–7.5)
VMA, Urine: 1.2 mg/L

## 2020-04-07 LAB — METANEPHRINES, PHEOCHROMOCYT
Creatinine, Random U: 40.6 mg/dL
Metaneph Total, Ur: 47 ug/L
Metaneph/Creat Ratio: 0.4 ug/mg Creat (ref 0.0–1.0)
Normetanephrine, Ur: 110 ug/L

## 2020-04-07 LAB — 5 HIAA, QUANTITATIVE, URINE, 24 HOUR
5-HIAA, Ur: 2.2 mg/L
5-HIAA,Quant.,24 Hr Urine: 5.8 mg/24 hr (ref 0.0–14.9)

## 2020-04-07 NOTE — Patient Instructions (Addendum)
Medication Instructions:  Your physician recommends that you continue on your current medications as directed. Please refer to the Current Medication list given to you today.  Labwork: None ordered.  Testing/Procedures: Your physician has recommended that you wear a holter monitor. Holter monitors are medical devices that record the heart's electrical activity.   You will wear a 14 day heart monitor  Follow-Up: Your physician wants you to follow-up based on results of your heart monitor.  Any Other Special Instructions Will Be Listed Below (If Applicable).  If you need a refill on your cardiac medications before your next appointment, please call your pharmacy.   ZIO XT- Long Term Monitor Instructions   Your physician has requested you wear your ZIO patch monitor_14_days.   This is a single patch monitor.  [employer] supplies one patch monitor per enrollment.  Additional stickers are not available.   Please do not apply patch if you will be having a Nuclear Stress Test, Echocardiogram, Cardiac CT, MRI, or Chest Xray during the time frame you would be wearing the monitor. The patch cannot be worn during these tests.  You cannot remove and re-apply the ZIO XT patch monitor.   Your ZIO patch monitor will be sent USPS Priority mail from [facility] directly to your home address. The monitor may also be mailed to a PO BOX if home delivery is not available.   It may take 3-5 days to receive your monitor after you have been enrolled.   Once you have received you monitor, please review enclosed instructions.  Your monitor has already been registered assigning a specific monitor serial # to you.   Applying the monitor   Shave hair from upper left chest.   Hold abrader disc by orange tab.  Rub abrader in 40 strokes over left upper chest as indicated in your monitor instructions.   Clean area with 4 enclosed alcohol pads .  Use all pads to assure are is cleaned thoroughly.  Let dry.    Apply patch as indicated in monitor instructions.  Patch will be place under collarbone on left side of chest with arrow pointing upward.   Rub patch adhesive wings for 2 minutes.Remove white label marked "1".  Remove white label marked "2".  Rub patch adhesive wings for 2 additional minutes.   While looking in a mirror, press and release button in center of patch.  A small green light will flash 3-4 times .  This will be your only indicator the monitor has been turned on.     Do not shower for the first 24 hours.  You may shower after the first 24 hours.   Press button if you feel a symptom. You will hear a small click.  Record Date, Time and Symptom in the Patient Log Book.   When you are ready to remove patch, follow instructions on last 2 pages of Patient Log Book.  Stick patch monitor onto last page of Patient Log Book.   Place Patient Log Book in [place] box.  Use locking tab on box and tape box closed securely.  The Orange and [employer] has [employer] on it.  Please place in mailbox as soon as possible.  Your physician should have your test results approximately 7 days after the monitor has been mailed back to [facility].   Call [facility] at [phone] if you have questions regarding your ZIO XT patch monitor.  Call them immediately if you see an orange light blinking on  your monitor.   If your monitor falls off in less than 4 days contact our Monitor department at [phone].  If your monitor becomes loose or falls off after 4 days call [employer] at [phone] for suggestions on securing your monitor.

## 2020-04-07 NOTE — Progress Notes (Signed)
Patient ID: Candace Shaw, female   DOB: 07-05-1996, 24 y.o.   MRN: [id number] Patient enrolled for [employer] to ship a 14 day ZIO XT long term holter monitor to her home.

## 2020-04-09 ENCOUNTER — Ambulatory Visit (INDEPENDENT_AMBULATORY_CARE_PROVIDER_SITE_OTHER): Payer: BC Managed Care – PPO

## 2020-04-09 DIAGNOSIS — R002 Palpitations: Secondary | ICD-10-CM

## 2020-04-16 ENCOUNTER — Other Ambulatory Visit: Payer: Self-pay

## 2020-04-16 ENCOUNTER — Telehealth (INDEPENDENT_AMBULATORY_CARE_PROVIDER_SITE_OTHER): Payer: BC Managed Care – PPO | Admitting: Internal Medicine

## 2020-04-16 ENCOUNTER — Encounter: Payer: Self-pay | Admitting: Internal Medicine

## 2020-04-16 VITALS — Temp 99.5°F | Ht 61.0 in | Wt 130.0 lb

## 2020-04-16 DIAGNOSIS — R509 Fever, unspecified: Secondary | ICD-10-CM | POA: Diagnosis not present

## 2020-04-16 DIAGNOSIS — J22 Unspecified acute lower respiratory infection: Secondary | ICD-10-CM

## 2020-04-16 DIAGNOSIS — Z20822 Contact with and (suspected) exposure to covid-19: Secondary | ICD-10-CM | POA: Diagnosis not present

## 2020-04-16 DIAGNOSIS — L539 Erythematous condition, unspecified: Secondary | ICD-10-CM | POA: Diagnosis not present

## 2020-04-16 NOTE — Progress Notes (Signed)
Virtual Visit via Video Note  I connected with@ on 04/16/20 at 12:30 PM EDT by a video enabled telemedicine application and verified that I am speaking with the correct person using two identifiers. Location patient: home Location provider:work  office Persons participating in the virtual visit: patient, provider  WIth national recommendations  regarding COVID 19 pandemic   video visit is advised over in office visit for this patient.  Patient aware  of the limitations of evaluation and management by telemedicine and  availability of in person appointments. and agreed to proceed.   HPI: Candace Shaw presents for video visit  Low grade temp  99.5  And headache for a couple days and  In school   [employer]  No cough  Sinus pressure  Sx  Similar to her sinus problems in past   But has been ok before this onset  Had antigen test .  Negative     An molecular.  PCR pending    [facility]  Classes  Environ med   Also asks about  Next step regarding weight loss and  Redness episodes  See  Rheum and derm  Evaluations  Still happening and concern   Is vegan  Avoids soy since may trigger .  ROS: See pertinent positives and negatives per HPI.  Had covid vaccine  End march  Did have fever systemic sx with this  Under eval for poss pots  Has monitor   Past Medical History:  Diagnosis Date  . Asthma   . Bipolar disorder ([place])   . Depression   . Hallucinogen dependence, unspecified 10/03/2012  . Headache(784.0)   . Obesity   . Personality disorder ([place])   . Urticaria     Past Surgical History:  Procedure Laterality Date  . WISDOM TOOTH EXTRACTION  Age 39    Family History  Adopted: Yes  Problem Relation Age of Onset  . Alcohol abuse Mother   . Alcohol abuse Father     Social History   Tobacco Use  . Smoking status: Former Smoker    Packs/day: 0.50    Years: 4.00    Pack years: 2.00    Types: Cigarettes    Quit date: 12/14/2018    Years since quitting: 1.3  . Smokeless tobacco: Never  Used  Vaping Use  . Vaping Use: Every day  . Substances: Nicotine  Substance Use Topics  . Alcohol use: Not Currently  . Drug use: Not Currently    Types: Marijuana      Current Outpatient Medications:  .  albuterol (VENTOLIN HFA) 108 (90 Base) MCG/ACT inhaler, Inhale 1-2 puffs into the lungs every 6 (six) hours as needed for wheezing or shortness of breath (pre exercise)., Disp: 18 g, Rfl: 1 .  diphenhydramine-acetaminophen (TYLENOL PM) 25-500 MG TABS tablet, Take 1 tablet by mouth at bedtime as needed., Disp: , Rfl:  .  gabapentin (NEURONTIN) 300 MG capsule, Take 300 mg by mouth 3 (three) times daily., Disp: , Rfl:  .  GABAPENTIN PO, Take 900 mg by mouth at bedtime. , Disp: , Rfl:  .  Ibuprofen ([employer] IBUPROFEN) 200 MG CAPS, Take by mouth 2 (two) times daily. , Disp: , Rfl:  .  influenza vac split quadrivalent PF (AFLURIA QUADRIVALENT) 0.5 ML injection, Afluria Qd 2020-21 (36 mos up)(PF)60 mcg (15 mcg x4)/0.5 mL IM syringe, Disp: , Rfl:  .  lamoTRIgine (LAMICTAL) 100 MG tablet, Take 300 mg by mouth at bedtime., Disp: , Rfl:  .  VYVANSE 20  MG capsule, Take 20 mg by mouth daily., Disp: , Rfl:   EXAM: BP Readings from Last 3 Encounters:  04/07/20 115/75  03/20/20 102/60  12/14/19 113/81    VITALS per patient if applicable:  GENERAL: alert, oriented, appears well and in no acute distress non toxic no cough during visit  HEENT: atraumatic, conjunttiva clear, no obvious abnormalities on inspection of external nose and ears  NECK: normal movements of the head and neck  LUNGS: on inspection no signs of respiratory distress, breathing rate appears normal, no obvious gross SOB, gasping or wheezing  CV: no obvious cyanosis   PSYCH/NEURO: pleasant and cooperative,  speech and thought processing grossly intact Lab Results  Component Value Date   WBC 7.6 09/25/2019   HGB 13.6 09/25/2019   HCT 40.5 09/25/2019   PLT 287.0 09/25/2019   GLUCOSE 81 11/16/2019   CHOL 176 07/09/2019    TRIG 75.0 07/09/2019   HDL 52.90 07/09/2019   LDLCALC 108 (H) 07/09/2019   ALT 11 11/16/2019   AST 13 11/16/2019   NA 136 11/16/2019   K 4.1 11/16/2019   CL 102 11/16/2019   CREATININE 0.71 11/16/2019   BUN 11 11/16/2019   CO2 24 11/16/2019   TSH 1.79 07/09/2019   HGBA1C 5.2 07/09/2019    ASSESSMENT AND PLAN:  Discussed the following assessment and plan:    ICD-10-CM   1. Acute respiratory infection  J22    likely getting covid tested  vaccinated   2. Redness  L53.9    At this time await test and sx rx  Antibiotic not helpful   Still battling cause  And control of her  Red flushing   Episodes and has been losing weight   Vegan diet gets protein  Caution with supplements and  Non allopathic med for se   Suggest  Tracking everything  Oral etc  And  Sx for 3 weeks and cont pictures as indicated  Counseled.  Will do note  for school   As needed. Send [place] a message about this .   Expectant management and discussion of plan and treatment with opportunity to ask questions and all were answered. The patient agreed with the plan and demonstrated an understanding of the instructions.   Advised to call back or seek an in-person evaluation if worsening  or having  further concerns . Return for depending on results.    Shanon Ace, MD

## 2020-04-21 DIAGNOSIS — F603 Borderline personality disorder: Secondary | ICD-10-CM | POA: Diagnosis not present

## 2020-04-21 IMAGING — CT CT HEAD W/O CM
3 series · 16 of 45 positions shown, 19 images · non-contrast
Comparison: None.

CLINICAL DATA: Headache for approximately 1 month

EXAM:
CT HEAD WITHOUT CONTRAST
TECHNIQUE: Contiguous axial images were obtained from the base of the skull
through the vertex without intravenous contrast.

[Series 2: head 5.0 h37s · axial · 0.40mm/px · z∈[-105,+10]mm · 10 of 28 slices shown, 13 images]
[im 3/28  brain]
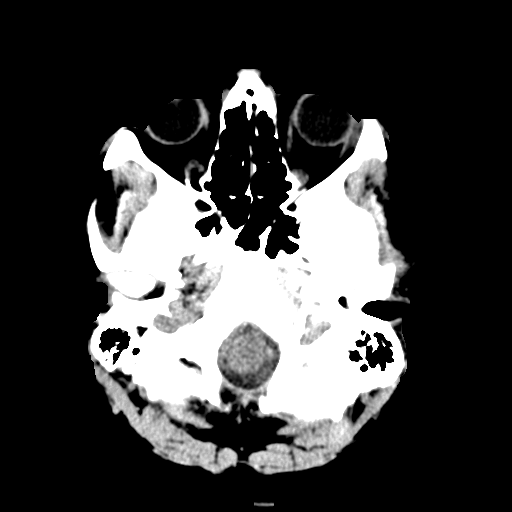
[im 3/28  bone]
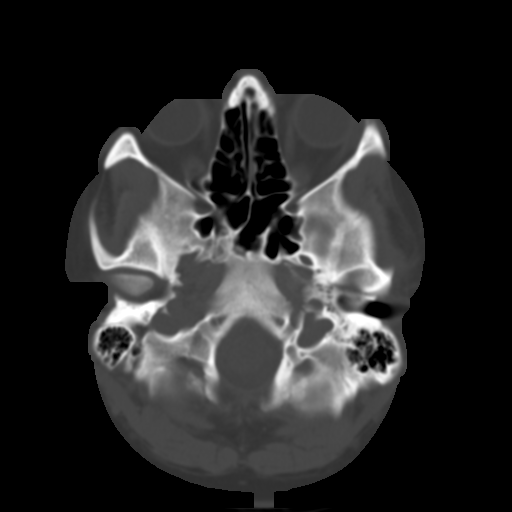
[im 5/28  brain]
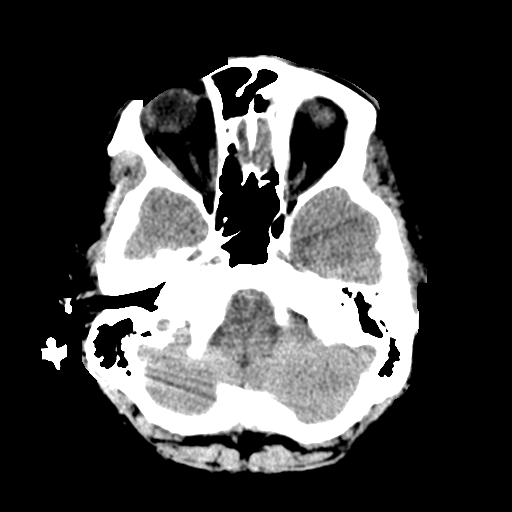
[im 8/28  brain]
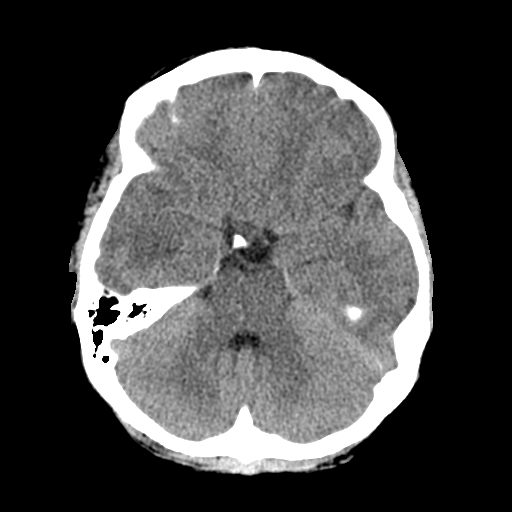
[im 11/28  brain]
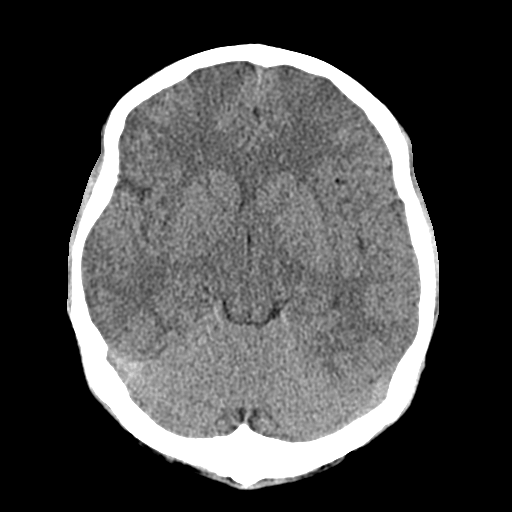
[im 13/28  brain]
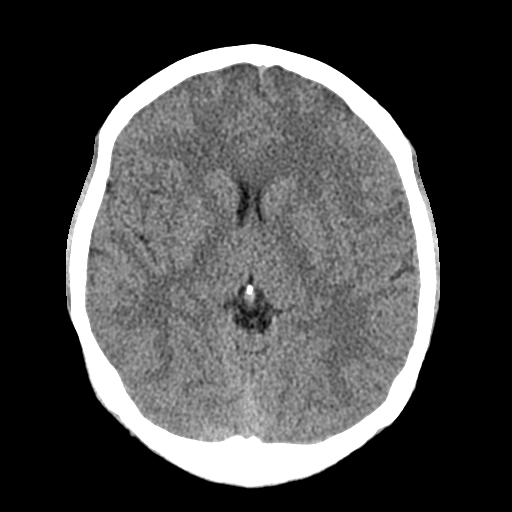
[im 13/28  bone]
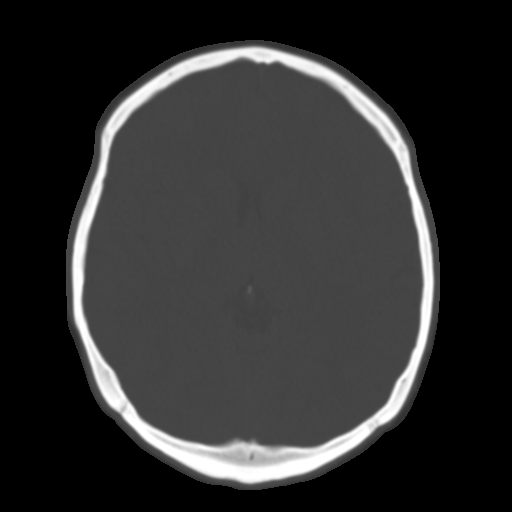
[im 16/28  brain]
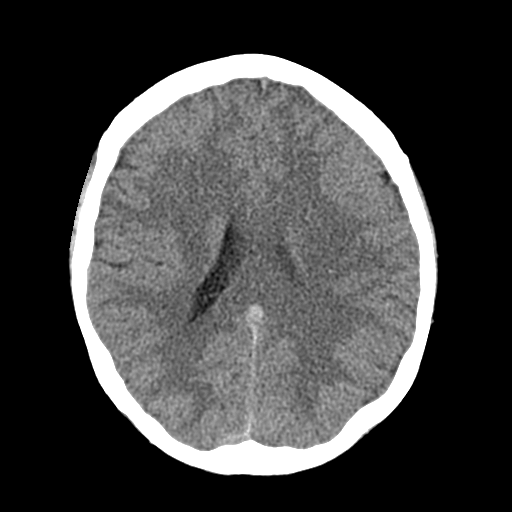
[im 18/28  brain]
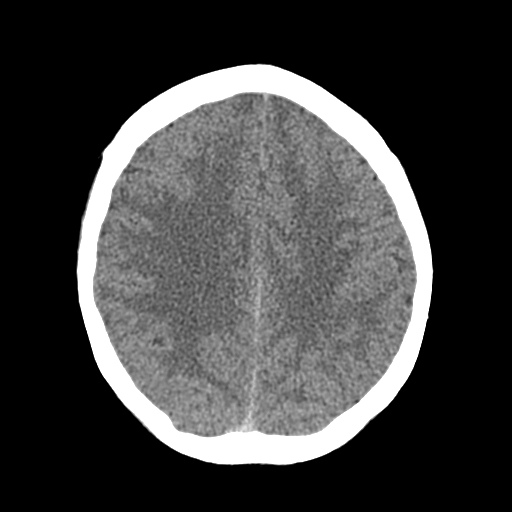
[im 21/28  brain]
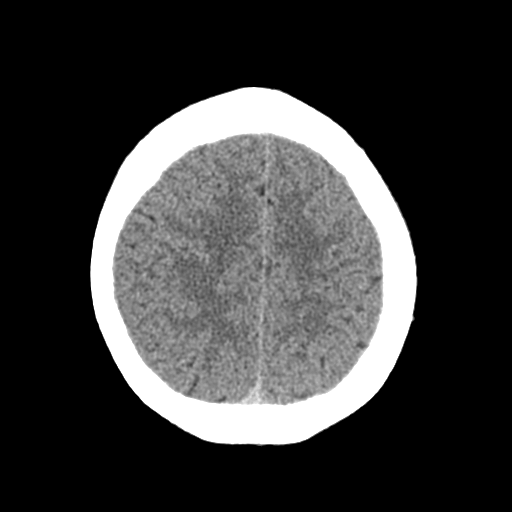
[im 24/28  brain]
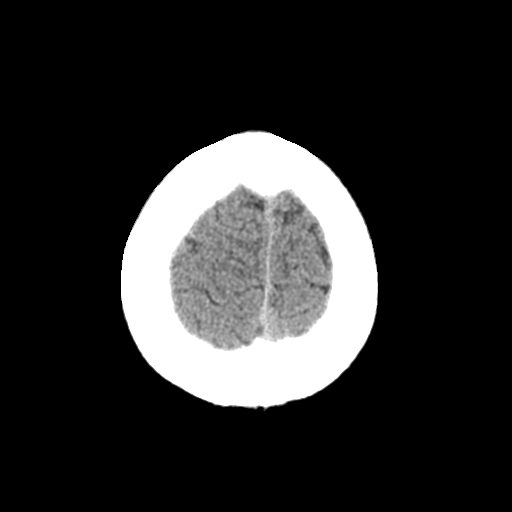
[im 24/28  bone]
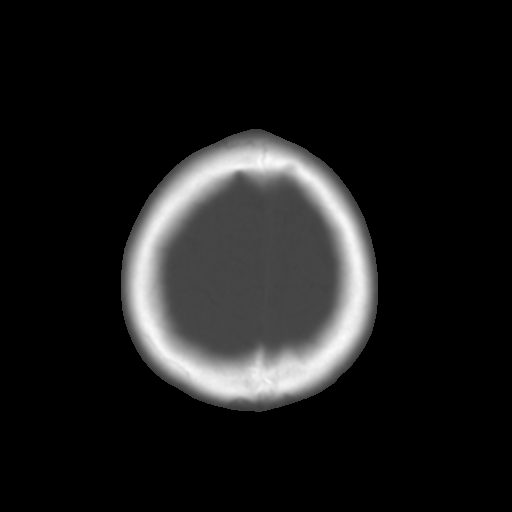
[im 26/28  brain]
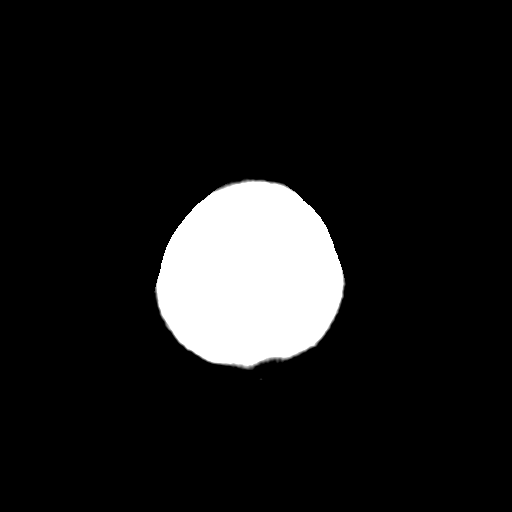

[Series 4: head 3.0 mpr cor · coronal · 0.29mm/px · 3 of 63 slices shown]
[im 21/63  brain]
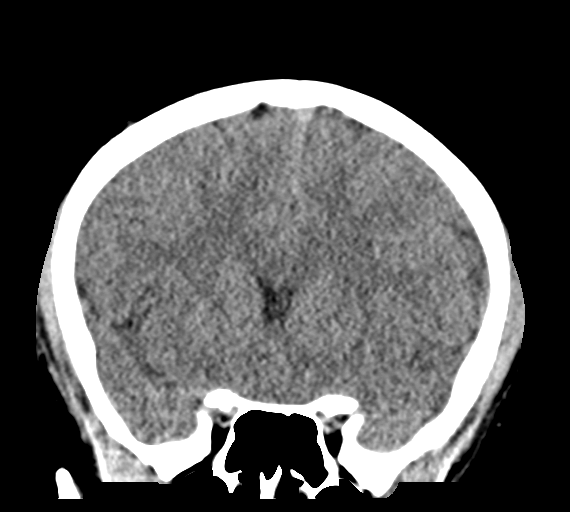
[im 28/63  brain]
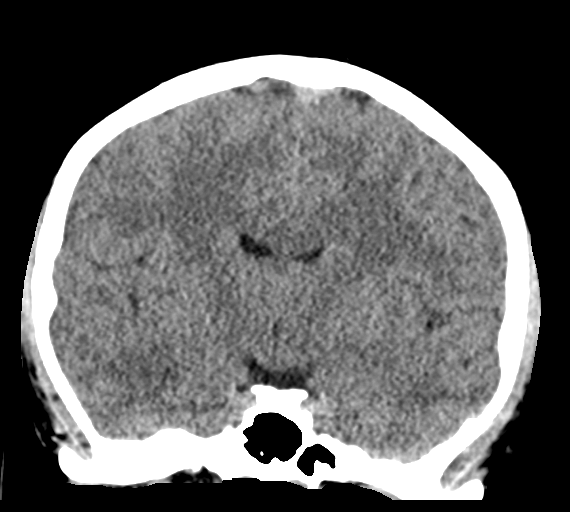
[im 35/63  brain]
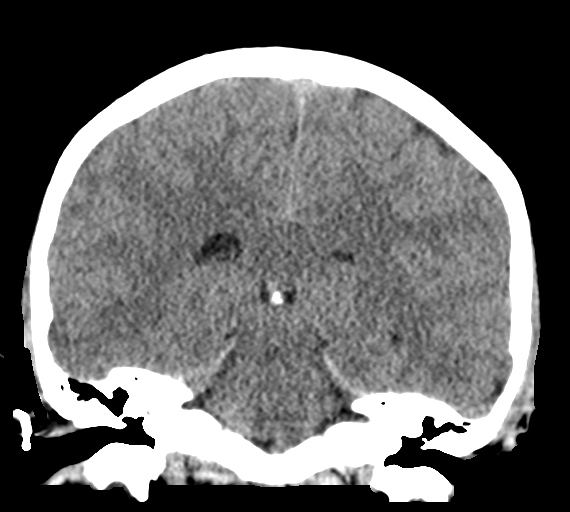

[Series 5: head 3.0 mpr sag · sagittal · 0.31mm/px · 3 of 57 slices shown]
[im 19/57  brain]
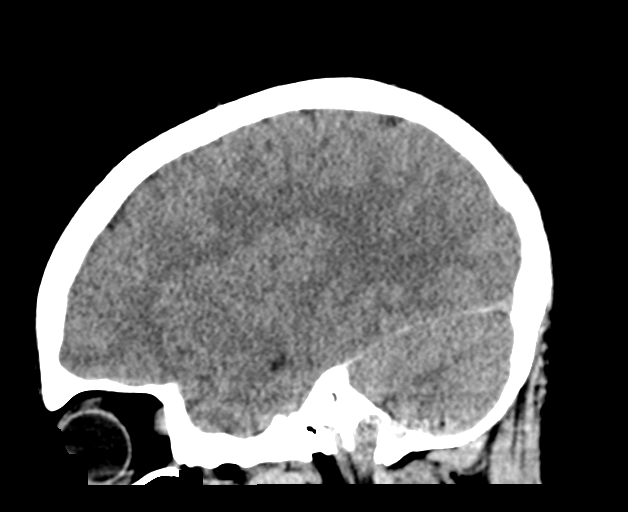
[im 29/57  brain]
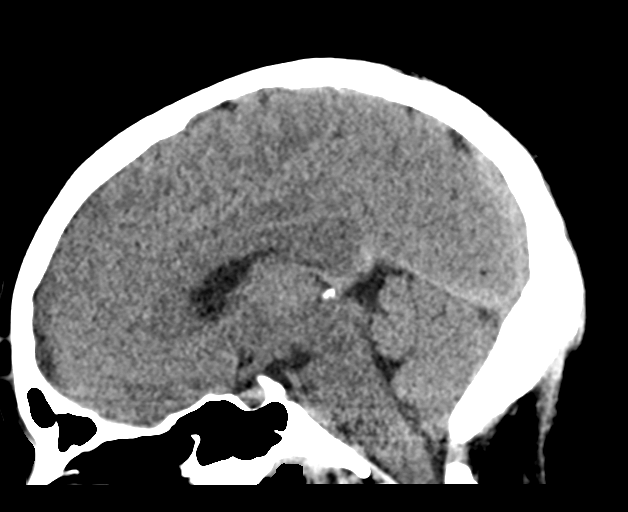
[im 38/57  brain]
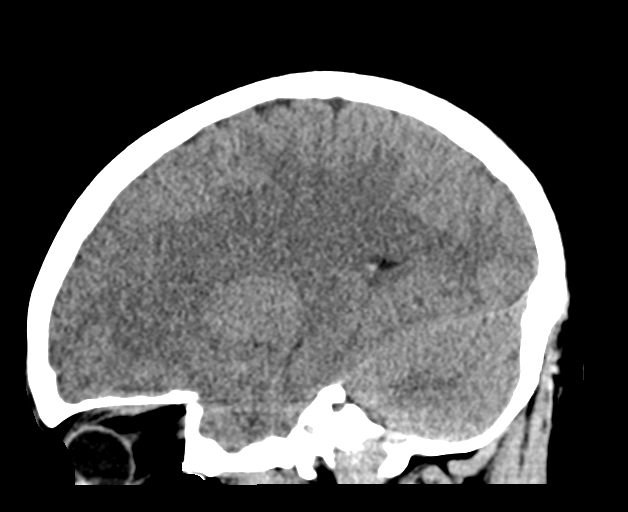

[16 of 45 positions shown; findings below may reference images not displayed]

FINDINGS: Brain: The ventricles are normal in size and configuration. There is
no intracranial mass, hemorrhage, extra-axial fluid collection, or
midline shift. Brain parenchyma appears normal. No acute infarct
evident.

Vascular: There is no hyperdense vessel. There is no appreciable
vascular calcification.

Skull: The bony calvarium appears intact.

Sinuses/Orbits: Visualized paranasal sinuses are clear. Visualized
orbits appear symmetric bilaterally.

Other: Visualized mastoid air cells are clear.
IMPRESSION: Study within normal limits.

## 2020-04-24 NOTE — Telephone Encounter (Signed)
So megan can  You giver her a note as requested  Out for medical reasons.   Also  if still having sinus pain send in  augmentin 875 1 po bid for 5 days  Disp 10 for sinusitis

## 2020-04-24 NOTE — Telephone Encounter (Signed)
Thanks for the pictures   I don't  have  A new idea for dx .    Send these also to your dermatolgist and rheumatology if you are able  Also see other message about the  Respiratory infection and sinus sx

## 2020-04-25 ENCOUNTER — Other Ambulatory Visit: Payer: Self-pay

## 2020-04-25 MED ORDER — AMOXICILLIN-POT CLAVULANATE 875-125 MG PO TABS: 1.0000 | ORAL_TABLET | Freq: Two times a day (BID) | ORAL | 0 refills | Status: AC

## 2020-04-28 DIAGNOSIS — F603 Borderline personality disorder: Secondary | ICD-10-CM | POA: Diagnosis not present

## 2020-05-05 DIAGNOSIS — F603 Borderline personality disorder: Secondary | ICD-10-CM | POA: Diagnosis not present

## 2020-05-06 DIAGNOSIS — R002 Palpitations: Secondary | ICD-10-CM | POA: Diagnosis not present

## 2020-05-08 ENCOUNTER — Encounter: Payer: Self-pay | Admitting: Allergy & Immunology

## 2020-05-08 ENCOUNTER — Telehealth: Payer: Self-pay | Admitting: *Deleted

## 2020-05-08 NOTE — Telephone Encounter (Signed)
Please schedule her a follow up appointment with Dr. Ernst Bowler to discuss her symptoms. If she would like to schedule an appointment with GI  I think that would be good. Thank you!

## 2020-05-08 NOTE — Telephone Encounter (Signed)
Error

## 2020-05-08 NOTE — Telephone Encounter (Signed)
Thank you :)

## 2020-05-12 DIAGNOSIS — F603 Borderline personality disorder: Secondary | ICD-10-CM | POA: Diagnosis not present

## 2020-05-13 ENCOUNTER — Encounter: Payer: Self-pay | Admitting: Allergy & Immunology

## 2020-05-13 ENCOUNTER — Telehealth: Payer: Self-pay

## 2020-05-13 ENCOUNTER — Ambulatory Visit (INDEPENDENT_AMBULATORY_CARE_PROVIDER_SITE_OTHER): Payer: BC Managed Care – PPO | Admitting: Allergy & Immunology

## 2020-05-13 ENCOUNTER — Other Ambulatory Visit: Payer: Self-pay

## 2020-05-13 VITALS — BP 102/70 | HR 82 | Resp 18

## 2020-05-13 DIAGNOSIS — J45990 Exercise induced bronchospasm: Secondary | ICD-10-CM

## 2020-05-13 DIAGNOSIS — R232 Flushing: Secondary | ICD-10-CM | POA: Diagnosis not present

## 2020-05-13 DIAGNOSIS — R140 Abdominal distension (gaseous): Secondary | ICD-10-CM

## 2020-05-13 DIAGNOSIS — B999 Unspecified infectious disease: Secondary | ICD-10-CM | POA: Diagnosis not present

## 2020-05-13 DIAGNOSIS — J3089 Other allergic rhinitis: Secondary | ICD-10-CM

## 2020-05-13 DIAGNOSIS — J302 Other seasonal allergic rhinitis: Secondary | ICD-10-CM

## 2020-05-13 DIAGNOSIS — R197 Diarrhea, unspecified: Secondary | ICD-10-CM | POA: Diagnosis not present

## 2020-05-13 NOTE — Patient Instructions (Addendum)
1. Exercise-induced asthma - Continue with albuterol as needed.  2. Chronic rhinitis (weeds, indoor molds, dust mites and cat) - Continue with: Benadryl as needed - You can use an extra dose of the antihistamine, if needed, for breakthrough symptoms.  - I do not think that allergy shots are needed at this time.   3. Flushing - I am unsure what is going on with you, but the workup thus far has been normal.  - Consider starting Xolair to see if this might help decrease the reactions. - We are going to refer you to see GI for evaluation of the diarrhea.  - We are going to get an immune workup as well.   3. Return in about 3 months (around 08/13/2020).    Please inform us of any Emergency Department visits, hospitalizations, or changes in symptoms. Call us before going to the ED for breathing or allergy symptoms since we might be able to fit you in for a sick visit. Feel free to contact us anytime with any questions, problems, or concerns.  It was a pleasure to see you again today!  Websites that have reliable patient information: 1. American Academy of Asthma, Allergy, and Immunology: www.aaaai.org 2. Food Allergy Research and Education (FARE): foodallergy.org 3. Mothers of Asthmatics: http://www.asthmacommunitynetwork.org 4. American College of Allergy, Asthma, and Immunology: www.acaai.org   COVID-19 Vaccine Information can be found at: ShippingScam.co.uk For questions related to vaccine distribution or appointments, please email [email or web address] or call [phone].     "Like" [place] on Facebook and Instagram for our latest updates!        Make sure you are registered to vote! If you have moved or changed any of your contact information, you will need to get this updated before voting!  In some cases, you MAY be able to register to vote online: [email or web address]

## 2020-05-13 NOTE — Telephone Encounter (Signed)
Ambulatory referral sent to GI for Diarrhea

## 2020-05-13 NOTE — Progress Notes (Signed)
FOLLOW UP  Date of Service/Encounter:  05/14/20   Assessment:   Exercise-induced asthma  Seasonal and perennial allergic rhinitis (weeds, indoor molds, dust mites and cat)  Food intolerance - with bloating and diarrhea (negative testing)  Flushing with perceived heart rate changes - normal cardiac workup  Flushing versus urticaria - starting approval process for Xolair  Plan/Recommendations:   1. Exercise-induced asthma - Continue with albuterol as needed.  2. Chronic rhinitis (weeds, indoor molds, dust mites and cat) - Continue with: Benadryl as needed - You can use an extra dose of the antihistamine, if needed, for breakthrough symptoms.  - I do not think that allergy shots are needed at this time.   3. Flushing - I am unsure what is going on with you, but the workup thus far has been normal.  - Consider starting Xolair to see if this might help decrease the reactions. - We are going to refer you to see GI for evaluation of the diarrhea.  - We are going to get an immune workup as well.   3. Return in about 3 months (around 08/13/2020).    Subjective:   Candace Shaw is a 24 y.o. female presenting today for follow up of  Chief Complaint  Patient presents with  . Exercise-induced Asthma  . Allergic Rhinitis   . Flushing  . Food Intolerance    Candace Shaw has a history of the following: Patient Active Problem List   Diagnosis Date Noted  . Snake bite 02/16/2018  . Acute lymphadenitis of lower extremity 02/16/2018  . Nonallopathic lesion of thoracic region 06/30/2016  . Nonallopathic lesion of rib cage 06/30/2016  . Nonallopathic lesion of cervical region 06/30/2016  . Scapular dyskinesis 04/29/2016  . Subacromial bursitis 04/29/2016  . Substance abuse in remission ([place]) 03/28/2015  . Headache(784.0) 12/14/2013  . OCP (oral contraceptive pills) initiation 08/15/2013  . Smoker 02/13/2013  . Depression 12/05/2012  . Tobacco user 08/19/2012   . Exercise-induced asthma 04/03/2011    History obtained from: chart review and patient.  Candace Shaw is a 24 y.o. female presenting for a follow up visit.  She was last seen as a new patient in August 2021.  At that time, we diagnosed her with exercise-induced bronchospasm and started albuterol as needed.  She had testing that was positive to weeds, indoor molds, dust mite, and cat.  We continue with Benadryl as needed.  I do not feel that allergy shots are warranted given her fairly mild symptoms.  We did testing to a wide variety of foods and these were all negative.  In fact, we did the entire food panel.  With her odd flushing episodes and heart rate changes, I was concerned with possible POTS.  We sent her to cardiology for work-up.  We sent a bunch of labs, including urine studies, and these were all negative.  This includes metanephrines, catecholamines, 5-HIAA, alpha gal, and protein electrophoresis.  We also sent a serum tryptase which was normal.  She has had an extensive autoimmune work-up in April 2021 which was normal.  Inflammatory markers were normal as well in April 2021.  She did see Cardiology. They did not feel that it was POTS. She mailed in a Holter monitor. They were not concerned with any HR changes at all. She did have a negative orthostatic testing as well.   She is interested in a functional medicine doctor. She has talked to some over the phone and it was recommended that she do $  900 IV treatments. Apparently these contain vitamin C, vitamin B12, and a combination of other medications. She did not sign up for this and was frankly somewhat skeptical when she heard about it over her telephone evaluation. She has heard both good and bad stories about [place] and has not called to schedule an appointment. She is not against paying the money, but she just did not think that this sat well with her and seemed somewhat scam like.   She does have more flushing  with her period. She has relief for three days DURING her period espeically after the first day of her period. She does not have dermatitis per say, but just the flushing. OCPs do not seem to change it at all.   Flushing mostly stays around for a few hours. It can happen up to six times a daily. This has become an embarrassment for her, to the point where she has to leave dates because the flushing is becoming such a problem. Cold seems to be better. She sleeps with ice packs. She will have some flares when she is in certain shoes for instance. Alternatively, she will also have issues when she is wearing tighter fitting clothes. She is open to trying anything new with this. She has treated with antihistamines, but she does not tolerate the sedating side effects.   She continues to have some intermittent diarrhea and bloating with these episodes, although they are not always associated with the episodes of flushing. She is very open to seeing a GI specialist.   She denies hyperextensible joints. She does have some problems with wound healing. She has never been evaluated for Ehlers-Danlos.   Otherwise, there have been no changes to her past medical history, surgical history, family history, or social history.    Review of Systems  Constitutional: Negative.  Negative for chills, fever, malaise/fatigue and weight loss.  HENT: Negative for congestion, ear discharge, ear pain, sinus pain and sore throat.   Eyes: Negative for pain, discharge and redness.  Respiratory: Negative for cough, sputum production, shortness of breath and wheezing.   Cardiovascular: Negative.  Negative for chest pain and palpitations.  Gastrointestinal: Positive for abdominal pain and diarrhea. Negative for constipation, heartburn, nausea and vomiting.       Positive for bloating.  Skin: Positive for itching and rash.  Neurological: Negative for dizziness and headaches.  Endo/Heme/Allergies: Positive for environmental  allergies. Does not bruise/bleed easily.       Objective:   Blood pressure 102/70, pulse 82, resp. rate 18, SpO2 97 %. There is no height or weight on file to calculate BMI.   Physical Exam:  Physical Exam Constitutional:      Appearance: She is well-developed.  HENT:     Head: Normocephalic and atraumatic.     Right Ear: Tympanic membrane, ear canal and external ear normal.     Left Ear: Tympanic membrane, ear canal and external ear normal.     Nose: No nasal deformity, septal deviation, mucosal edema or rhinorrhea.     Right Turbinates: Enlarged and swollen.     Left Turbinates: Enlarged and swollen.     Right Sinus: No maxillary sinus tenderness or frontal sinus tenderness.     Left Sinus: No maxillary sinus tenderness or frontal sinus tenderness.     Mouth/Throat:     Mouth: Mucous membranes are moist. Mucous membranes are not pale and not dry.     Pharynx: Uvula midline.  Eyes:  General:        Right eye: No discharge.        Left eye: No discharge.     Conjunctiva/sclera: Conjunctivae normal.     Right eye: Right conjunctiva is not injected. No chemosis.    Left eye: Left conjunctiva is not injected. No chemosis.    Pupils: Pupils are equal, round, and reactive to light.  Cardiovascular:     Rate and Rhythm: Normal rate and regular rhythm.     Heart sounds: Normal heart sounds.  Pulmonary:     Effort: Pulmonary effort is normal. No tachypnea, accessory muscle usage or respiratory distress.     Breath sounds: Normal breath sounds. No wheezing, rhonchi or rales.  Chest:     Chest wall: No tenderness.  Lymphadenopathy:     Cervical: No cervical adenopathy.  Skin:    Coloration: Skin is not pale.     Findings: No abrasion, erythema, petechiae or rash. Rash is not papular, urticarial or vesicular.     Comments: Marked dermatographism. She does have many marks on her arms, wrists, and legs from butting when she was younger. These become especially prominent  whenever the flushing involves these areas of her body.  Neurological:     Mental Status: She is alert.      Diagnostic studies: labs sent instead      Salvatore Marvel, MD  [facility]

## 2020-05-14 ENCOUNTER — Encounter: Payer: Self-pay | Admitting: Allergy & Immunology

## 2020-05-19 ENCOUNTER — Telehealth: Payer: Self-pay | Admitting: *Deleted

## 2020-05-19 DIAGNOSIS — L501 Idiopathic urticaria: Secondary | ICD-10-CM | POA: Diagnosis not present

## 2020-05-19 DIAGNOSIS — F603 Borderline personality disorder: Secondary | ICD-10-CM | POA: Diagnosis not present

## 2020-05-19 NOTE — Telephone Encounter (Signed)
-----   Message from Valentina Shaggy, MD sent at 05/14/2020 12:09 AM EDT ----- NEW START [facility] FOR [facility]!

## 2020-05-19 NOTE — Telephone Encounter (Signed)
Spoke to patient and advised approval for Xolair and will submit for copay card.  Advised patient buy and bill and will set up appt to start therapy

## 2020-05-20 ENCOUNTER — Ambulatory Visit (INDEPENDENT_AMBULATORY_CARE_PROVIDER_SITE_OTHER): Payer: BC Managed Care – PPO

## 2020-05-20 ENCOUNTER — Other Ambulatory Visit: Payer: Self-pay

## 2020-05-20 DIAGNOSIS — L501 Idiopathic urticaria: Secondary | ICD-10-CM | POA: Diagnosis not present

## 2020-05-20 MED ORDER — OMALIZUMAB 150 MG ~~LOC~~ SOLR
300.0000 mg | SUBCUTANEOUS | Status: DC
  Administered 2020-05-20 – 2020-07-18 (×3): 300 mg via SUBCUTANEOUS

## 2020-05-20 MED ORDER — EPINEPHRINE 0.3 MG/0.3ML IJ SOAJ: 0.3000 mg | INTRAMUSCULAR | 1 refills | Status: DC | PRN

## 2020-05-20 NOTE — Progress Notes (Signed)
Immunotherapy   Patient Details  Name: Candace Shaw MRN: [id number] Date of Birth: [birth year]  05/20/2020  Candace Shaw is starting Xolair 300 mg every 28 days today.  Patient waited 30 minutes in office post injection with no local or systemic reactions.  Epi-Pen:Prescription for Epi-Pen given Consent signed and patient instructions given.   [name] [name] 05/20/2020, 9:00 AM

## 2020-05-22 DIAGNOSIS — F603 Borderline personality disorder: Secondary | ICD-10-CM | POA: Diagnosis not present

## 2020-05-23 DIAGNOSIS — F3132 Bipolar disorder, current episode depressed, moderate: Secondary | ICD-10-CM | POA: Diagnosis not present

## 2020-05-23 DIAGNOSIS — F509 Eating disorder, unspecified: Secondary | ICD-10-CM | POA: Diagnosis not present

## 2020-05-23 DIAGNOSIS — F902 Attention-deficit hyperactivity disorder, combined type: Secondary | ICD-10-CM | POA: Diagnosis not present

## 2020-05-23 DIAGNOSIS — F603 Borderline personality disorder: Secondary | ICD-10-CM | POA: Diagnosis not present

## 2020-05-26 DIAGNOSIS — F603 Borderline personality disorder: Secondary | ICD-10-CM | POA: Diagnosis not present

## 2020-05-27 LAB — STREP PNEUMONIAE 23 SEROTYPES IGG
Pneumo Ab Type 1*: 0.7 ug/mL — ABNORMAL LOW (ref >1.3)
Pneumo Ab Type 12 (12F)*: 0.2 ug/mL — ABNORMAL LOW (ref >1.3)
Pneumo Ab Type 14*: 0.6 ug/mL — ABNORMAL LOW (ref >1.3)
Pneumo Ab Type 17 (17F)*: 0.8 ug/mL — ABNORMAL LOW (ref >1.3)
Pneumo Ab Type 19 (19F)*: 1.9 ug/mL (ref >1.3)
Pneumo Ab Type 2*: 1.2 ug/mL — ABNORMAL LOW (ref >1.3)
Pneumo Ab Type 20*: 2.6 ug/mL (ref >1.3)
Pneumo Ab Type 22 (22F)*: 1 ug/mL — ABNORMAL LOW (ref >1.3)
Pneumo Ab Type 23 (23F)*: 0.9 ug/mL — ABNORMAL LOW (ref >1.3)
Pneumo Ab Type 26 (6B)*: 0.4 ug/mL — ABNORMAL LOW (ref >1.3)
Pneumo Ab Type 3*: 4.6 ug/mL (ref >1.3)
Pneumo Ab Type 34 (10A)*: 1.4 ug/mL (ref >1.3)
Pneumo Ab Type 4*: 0.7 ug/mL — ABNORMAL LOW (ref >1.3)
Pneumo Ab Type 43 (11A)*: 1.5 ug/mL (ref >1.3)
Pneumo Ab Type 5*: 1.1 ug/mL — ABNORMAL LOW (ref >1.3)
Pneumo Ab Type 51 (7F)*: 2.2 ug/mL (ref >1.3)
Pneumo Ab Type 54 (15B)*: 2.2 ug/mL (ref >1.3)
Pneumo Ab Type 56 (18C)*: 0.3 ug/mL — ABNORMAL LOW (ref >1.3)
Pneumo Ab Type 57 (19A)*: 3.4 ug/mL (ref >1.3)
Pneumo Ab Type 68 (9V)*: 0.7 ug/mL — ABNORMAL LOW (ref >1.3)
Pneumo Ab Type 70 (33F)*: 3.2 ug/mL (ref >1.3)
Pneumo Ab Type 8*: 24.6 ug/mL (ref >1.3)
Pneumo Ab Type 9 (9N)*: 2.5 ug/mL (ref >1.3)

## 2020-05-27 LAB — CBC WITH DIFFERENTIAL
Basophils Absolute: 0 10*3/uL (ref 0.0–0.2)
Basos: 1 %
EOS (ABSOLUTE): 0.1 10*3/uL (ref 0.0–0.4)
Eos: 1 %
Hematocrit: 41.6 % (ref 34.0–46.6)
Hemoglobin: 14 g/dL (ref 11.1–15.9)
Immature Grans (Abs): 0 10*3/uL (ref 0.0–0.1)
Immature Granulocytes: 0 %
Lymphocytes Absolute: 1.7 10*3/uL (ref 0.7–3.1)
Lymphs: 31 %
MCH: 31 pg (ref 26.6–33.0)
MCHC: 33.7 g/dL (ref 31.5–35.7)
MCV: 92 fL (ref 79–97)
Monocytes Absolute: 0.3 10*3/uL (ref 0.1–0.9)
Monocytes: 5 %
Neutrophils Absolute: 3.2 10*3/uL (ref 1.4–7.0)
Neutrophils: 62 %
RBC: 4.51 x10E6/uL (ref 3.77–5.28)
RDW: 12 % (ref 11.7–15.4)
WBC: 5.3 10*3/uL (ref 3.4–10.8)

## 2020-05-27 LAB — IGG, IGA, IGM
IgA/Immunoglobulin A, Serum: 312 mg/dL (ref 87–352)
IgG (Immunoglobin G), Serum: 1300 mg/dL (ref 586–1602)
IgM (Immunoglobulin M), Srm: 147 mg/dL (ref 26–217)

## 2020-05-27 LAB — COMPLEMENT, TOTAL: Compl, Total (CH50): >60 U/mL (ref >41)

## 2020-05-27 LAB — DIPHTHERIA / TETANUS ANTIBODY PANEL
Diphtheria Ab: 0.27 IU/mL (ref <0.10)
Tetanus Ab, IgG: 0.87 IU/mL (ref <0.10)

## 2020-05-27 LAB — TRYPTASE: Tryptase: 3.9 ug/L (ref 2.2–13.2)

## 2020-06-02 ENCOUNTER — Telehealth: Payer: Self-pay | Admitting: Allergy & Immunology

## 2020-06-02 DIAGNOSIS — F603 Borderline personality disorder: Secondary | ICD-10-CM | POA: Diagnosis not present

## 2020-06-02 NOTE — Telephone Encounter (Signed)
Patient is returning a call about blood work.

## 2020-06-02 NOTE — Telephone Encounter (Signed)
Patient has been made aware and has schedule an appointment for a Pneumovax.

## 2020-06-05 ENCOUNTER — Ambulatory Visit: Payer: Self-pay

## 2020-06-06 ENCOUNTER — Other Ambulatory Visit: Payer: Self-pay

## 2020-06-06 ENCOUNTER — Ambulatory Visit (INDEPENDENT_AMBULATORY_CARE_PROVIDER_SITE_OTHER): Payer: BC Managed Care – PPO | Admitting: *Deleted

## 2020-06-06 DIAGNOSIS — Z23 Encounter for immunization: Secondary | ICD-10-CM | POA: Diagnosis not present

## 2020-06-06 DIAGNOSIS — B999 Unspecified infectious disease: Secondary | ICD-10-CM

## 2020-06-07 DIAGNOSIS — F603 Borderline personality disorder: Secondary | ICD-10-CM | POA: Diagnosis not present

## 2020-06-09 DIAGNOSIS — F603 Borderline personality disorder: Secondary | ICD-10-CM | POA: Diagnosis not present

## 2020-06-12 DIAGNOSIS — F902 Attention-deficit hyperactivity disorder, combined type: Secondary | ICD-10-CM | POA: Diagnosis not present

## 2020-06-12 DIAGNOSIS — F603 Borderline personality disorder: Secondary | ICD-10-CM | POA: Diagnosis not present

## 2020-06-12 DIAGNOSIS — F509 Eating disorder, unspecified: Secondary | ICD-10-CM | POA: Diagnosis not present

## 2020-06-12 DIAGNOSIS — F3132 Bipolar disorder, current episode depressed, moderate: Secondary | ICD-10-CM | POA: Diagnosis not present

## 2020-06-14 DIAGNOSIS — F603 Borderline personality disorder: Secondary | ICD-10-CM | POA: Diagnosis not present

## 2020-06-16 DIAGNOSIS — F603 Borderline personality disorder: Secondary | ICD-10-CM | POA: Diagnosis not present

## 2020-06-19 DIAGNOSIS — L501 Idiopathic urticaria: Secondary | ICD-10-CM

## 2020-06-20 ENCOUNTER — Other Ambulatory Visit: Payer: Self-pay

## 2020-06-20 ENCOUNTER — Ambulatory Visit (INDEPENDENT_AMBULATORY_CARE_PROVIDER_SITE_OTHER): Payer: BC Managed Care – PPO

## 2020-06-20 DIAGNOSIS — L501 Idiopathic urticaria: Secondary | ICD-10-CM

## 2020-06-21 DIAGNOSIS — F603 Borderline personality disorder: Secondary | ICD-10-CM | POA: Diagnosis not present

## 2020-06-23 DIAGNOSIS — F603 Borderline personality disorder: Secondary | ICD-10-CM | POA: Diagnosis not present

## 2020-06-30 DIAGNOSIS — F603 Borderline personality disorder: Secondary | ICD-10-CM | POA: Diagnosis not present

## 2020-07-07 DIAGNOSIS — F603 Borderline personality disorder: Secondary | ICD-10-CM | POA: Diagnosis not present

## 2020-07-11 ENCOUNTER — Ambulatory Visit (INDEPENDENT_AMBULATORY_CARE_PROVIDER_SITE_OTHER): Payer: BC Managed Care – PPO | Admitting: Gastroenterology

## 2020-07-11 ENCOUNTER — Encounter: Payer: Self-pay | Admitting: Gastroenterology

## 2020-07-11 DIAGNOSIS — R140 Abdominal distension (gaseous): Secondary | ICD-10-CM

## 2020-07-11 DIAGNOSIS — R1013 Epigastric pain: Secondary | ICD-10-CM | POA: Diagnosis not present

## 2020-07-11 DIAGNOSIS — K529 Noninfective gastroenteritis and colitis, unspecified: Secondary | ICD-10-CM

## 2020-07-11 NOTE — Patient Instructions (Signed)
If you are age 26 or older, your body mass index should be between 23-30. Your Body mass index is 26 kg/m. If this is out of the aforementioned range listed, please consider follow up with your Primary Care Provider.  If you are age 70 or younger, your body mass index should be between 19-25. Your Body mass index is 26 kg/m. If this is out of the aformentioned range listed, please consider follow up with your Primary Care Provider.   You have been scheduled for a colonoscopy. Please follow written instructions given to you at your visit today.  Please pick up your prep supplies at the pharmacy within the next 1-3 days. If you use inhalers (even only as needed), please bring them with you on the day of your procedure.  Due to recent changes in healthcare laws, you may see the results of your imaging and laboratory studies on MyChart before your provider has had a chance to review them.  We understand that in some cases there may be results that are confusing or concerning to you. Not all laboratory results come back in the same time frame and the provider may be waiting for multiple results in order to interpret others.  Please give [place] 48 hours in order for your provider to thoroughly review all the results before contacting the office for clarification of your results.   Thank you for entrusting me with your care and choosing [facility].  Dr Ardis Hughs

## 2020-07-11 NOTE — Progress Notes (Signed)
HPI: This is a very pleasant 24 year old woman who was referred to me by Valentina Shaggy, *  to evaluate chronic diarrhea, chronic bloating dyspepsia, "flushing".    She has had troubles with her bowels and stomach for a few years at least.  She is bothered by bloating with just about every meal.  This is regardless of what she eats but she is admittedly a vegan.  She sometimes eats eggs.  She knows she has difficulty with dairy products and so she completely avoids them.  She has also had chronic diarrhea.  She will have 5-6 loose stools daily, most of them in the morning, some of them bloody.  This will occur at night sometimes.  She has chronic generalized lower abdominal pains.  Her weight is overall stable.  She is also bothered by "flushing" however this sounds more likely to be urticaria.  She showed me several pictures on her phone of her urticarial appearing rash that can occur on her arms, trunk.  She has had numerous blood tests and urine test over the past year or 2.  See those below.   Old Data Reviewed: 2021 labs: SPEP, plasma metanephrines, alpha gal panel, VMA and catecholamines, urinary 5-HIAA, urinary metanephrines, ANA, sed rate, CBC, complete metabolic profile all were normal. 2020 celiac testing with TTG was completely normal, total IgA was completely normal    Review of systems: Pertinent positive and negative review of systems were noted in the above HPI section. All other review negative.   Past Medical History:  Diagnosis Date  . Alcoholism ([place])   . Asthma   . Bipolar disorder ([place])   . Depression   . Hallucinogen dependence, unspecified 10/03/2012  . Headache(784.0)   . Obesity   . Personality disorder ([place])   . Urticaria     Past Surgical History:  Procedure Laterality Date  . WISDOM TOOTH EXTRACTION  Age 56    Current Outpatient Medications  Medication Sig Dispense Refill  . albuterol (VENTOLIN HFA) 108 (90 Base) MCG/ACT inhaler Inhale  1-2 puffs into the lungs every 6 (six) hours as needed for wheezing or shortness of breath (pre exercise). 18 g 1  . diphenhydramine-acetaminophen (TYLENOL PM) 25-500 MG TABS tablet Take 1 tablet by mouth at bedtime as needed.    . fluvoxaMINE (LUVOX) 25 MG tablet Take 25 mg by mouth at bedtime.    . hydrOXYzine (VISTARIL) 25 MG capsule Take 25-50 mg by mouth daily as needed.    . Ibuprofen ([facility] IBUPROFEN) 200 MG CAPS Take by mouth 2 (two) times daily as needed.     . lamoTRIgine (LAMICTAL) 100 MG tablet Take 300 mg by mouth at bedtime.    . methylphenidate (RITALIN) 10 MG tablet as needed.     Marland Kitchen VYVANSE 20 MG capsule Take 20 mg by mouth as needed.     Marland Kitchen EPINEPHrine (AUVI-Q) 0.3 mg/0.3 mL IJ SOAJ injection Inject 0.3 mg into the muscle as needed for anaphylaxis. (Patient not taking: Reported on 07/11/2020) 2 each 1   Current Facility-Administered Medications  Medication Dose Route Frequency Provider Last Rate Last Admin  . omalizumab Arvid Right) injection 300 mg  300 mg Subcutaneous Q28 days Valentina Shaggy, MD   300 mg at 06/20/20 8756    Allergies as of 07/11/2020 - Review Complete 07/11/2020  Allergen Reaction Noted  . Ambien [zolpidem tartrate]  10/21/2011    Family History  Adopted: Yes  Problem Relation Age of Onset  . Alcohol abuse Mother   .  Alcohol abuse Father     Social History   Socioeconomic History  . Marital status: Single    Spouse name: Not on file  . Number of children: 0  . Years of education: Not on file  . Highest education level: Not on file  Occupational History  . Occupation: coffee shop   Tobacco Use  . Smoking status: Former Smoker    Packs/day: 0.50    Years: 4.00    Pack years: 2.00    Types: Cigarettes    Quit date: 12/14/2018    Years since quitting: 1.5  . Smokeless tobacco: Never Used  Vaping Use  . Vaping Use: Every day  . Substances: Nicotine  Substance and Sexual Activity  . Alcohol use: Not Currently    Comment: sober since 2014   . Drug use: Not Currently    Types: Marijuana  . Sexual activity: Not Currently    Partners: Female    Birth control/protection: Condom, OCP, Pill  Other Topics Concern  . Not on file  Social History Narrative   Legal Guardians: [place] and [place] [employer] [place]   Lives with rrommate works  [employer]  77 - 30 hours per week    Social Determinants of Health   Financial Resource Strain:   . Difficulty of Paying Living Expenses: Not on file  Food Insecurity:   . Worried About Charity fundraiser in the Last Year: Not on file  . Ran Out of Food in the Last Year: Not on file  Transportation Needs:   . Lack of Transportation (Medical): Not on file  . Lack of Transportation (Non-Medical): Not on file  Physical Activity:   . Days of Exercise per Week: Not on file  . Minutes of Exercise per Session: Not on file  Stress:   . Feeling of Stress : Not on file  Social Connections:   . Frequency of Communication with Friends and Family: Not on file  . Frequency of Social Gatherings with Friends and Family: Not on file  . Attends Religious Services: Not on file  . Active Member of Clubs or Organizations: Not on file  . Attends Archivist Meetings: Not on file  . Marital Status: Not on file  Intimate Partner Violence:   . Fear of Current or Ex-Partner: Not on file  . Emotionally Abused: Not on file  . Physically Abused: Not on file  . Sexually Abused: Not on file     Physical Exam: Ht 5' 1.75" (1.568 m) Comment: height measured without shoes  Wt 141 lb (64 kg)   BMI 26.00 kg/m  Constitutional: generally well-appearing Psychiatric: alert and oriented x3 Eyes: extraocular movements intact Mouth: oral pharynx moist, no lesions Neck: supple no lymphadenopathy Cardiovascular: heart regular rate and rhythm Lungs: clear to auscultation bilaterally Abdomen: soft, nontender, nondistended, no obvious ascites, no  peritoneal signs, normal bowel sounds Extremities: no lower extremity edema bilaterally Skin: no lesions on visible extremities   Assessment and plan: 24 y.o. female with chronic loose stools, intermittently bloody stools, chronic dyspepsia and bloating  Her vegan diet may contribute to some of her bloating and dyspepsia.  Perhaps also contributes to her chronic loose stools.  I recommended we go ahead with some invasive testing with a colonoscopy to exclude potential inflammatory bowel disease given the bloody diarrhea and chronic nature of her diarrhea.  I also  recommended upper endoscopy for her bloating and dyspepsia to rule out H. pylori infection, check for peptic ulcer disease or significant GERD related damage.  I see no reason for any further blood tests or imaging studies prior to then.   Please see the "Patient Instructions" section for addition details about the plan.   Owens Loffler, MD [facility] 07/11/2020, 10:32 AM  Cc: Valentina Shaggy, *  Total time on date of encounter was 45  minutes (this included time spent preparing to see the patient reviewing records; obtaining and/or reviewing separately obtained history; performing a medically appropriate exam and/or evaluation; counseling and educating the patient and family if present; ordering medications, tests or procedures if applicable; and documenting clinical information in the health record).

## 2020-07-17 DIAGNOSIS — L501 Idiopathic urticaria: Secondary | ICD-10-CM | POA: Diagnosis not present

## 2020-07-18 ENCOUNTER — Other Ambulatory Visit: Payer: Self-pay

## 2020-07-18 ENCOUNTER — Ambulatory Visit (INDEPENDENT_AMBULATORY_CARE_PROVIDER_SITE_OTHER): Payer: BC Managed Care – PPO

## 2020-07-18 DIAGNOSIS — L501 Idiopathic urticaria: Secondary | ICD-10-CM

## 2020-07-21 DIAGNOSIS — F3132 Bipolar disorder, current episode depressed, moderate: Secondary | ICD-10-CM | POA: Diagnosis not present

## 2020-07-21 DIAGNOSIS — F603 Borderline personality disorder: Secondary | ICD-10-CM | POA: Diagnosis not present

## 2020-07-21 DIAGNOSIS — F902 Attention-deficit hyperactivity disorder, combined type: Secondary | ICD-10-CM | POA: Diagnosis not present

## 2020-07-21 DIAGNOSIS — F509 Eating disorder, unspecified: Secondary | ICD-10-CM | POA: Diagnosis not present

## 2020-07-22 ENCOUNTER — Encounter: Payer: Self-pay | Admitting: Gastroenterology

## 2020-07-22 ENCOUNTER — Other Ambulatory Visit: Payer: Self-pay

## 2020-07-22 ENCOUNTER — Ambulatory Visit (AMBULATORY_SURGERY_CENTER): Payer: BC Managed Care – PPO | Admitting: Gastroenterology

## 2020-07-22 VITALS — BP 114/76 | HR 72 | Temp 98.5°F | Resp 14 | Ht 61.0 in | Wt 141.0 lb

## 2020-07-22 DIAGNOSIS — K635 Polyp of colon: Secondary | ICD-10-CM | POA: Diagnosis not present

## 2020-07-22 DIAGNOSIS — R197 Diarrhea, unspecified: Secondary | ICD-10-CM | POA: Diagnosis not present

## 2020-07-22 DIAGNOSIS — K3189 Other diseases of stomach and duodenum: Secondary | ICD-10-CM | POA: Diagnosis not present

## 2020-07-22 DIAGNOSIS — R1013 Epigastric pain: Secondary | ICD-10-CM | POA: Diagnosis not present

## 2020-07-22 DIAGNOSIS — K2980 Duodenitis without bleeding: Secondary | ICD-10-CM | POA: Diagnosis not present

## 2020-07-22 DIAGNOSIS — K529 Noninfective gastroenteritis and colitis, unspecified: Secondary | ICD-10-CM

## 2020-07-22 DIAGNOSIS — K319 Disease of stomach and duodenum, unspecified: Secondary | ICD-10-CM

## 2020-07-22 DIAGNOSIS — D122 Benign neoplasm of ascending colon: Secondary | ICD-10-CM

## 2020-07-22 DIAGNOSIS — K2970 Gastritis, unspecified, without bleeding: Secondary | ICD-10-CM | POA: Diagnosis not present

## 2020-07-22 DIAGNOSIS — K2990 Gastroduodenitis, unspecified, without bleeding: Secondary | ICD-10-CM

## 2020-07-22 MED ORDER — SODIUM CHLORIDE 0.9 % IV SOLN: 500.0000 mL | Freq: Once | INTRAVENOUS | Status: DC

## 2020-07-22 NOTE — Progress Notes (Addendum)
AR - Check-in  CW - VS  Pt in remission from [place] dependancy.  P request not to take any narcotics.

## 2020-07-22 NOTE — Progress Notes (Signed)
Called to room to assist during endoscopic procedure.  Patient ID and intended procedure confirmed with present staff. Received instructions for my participation in the procedure from the performing physician.  

## 2020-07-22 NOTE — Op Note (Signed)
[facility] Patient Name: Candace Shaw Procedure Date: 07/22/2020 9:30 AM MRN: [id number] Endoscopist: Milus Banister , MD Age: 24 Referring MD:  Date of Birth: [birth year] Gender: Female Account #: [id number] Procedure:                Upper GI endoscopy Indications:              Dyspepsia, bloating, chronic loose stools Medicines:                Monitored Anesthesia Care Procedure:                Pre-Anesthesia Assessment:                           - Prior to the procedure, a History and Physical                            was performed, and patient medications and                            allergies were reviewed. The patient's tolerance of                            previous anesthesia was also reviewed. The risks                            and benefits of the procedure and the sedation                            options and risks were discussed with the patient.                            All questions were answered, and informed consent                            was obtained. Prior Anticoagulants: The patient has                            taken no previous anticoagulant or antiplatelet                            agents. ASA Grade Assessment: II - A patient with                            mild systemic disease. After reviewing the risks                            and benefits, the patient was deemed in                            satisfactory condition to undergo the procedure.                           After obtaining informed consent, the endoscope was  passed under direct vision. Throughout the                            procedure, the patient's blood pressure, pulse, and                            oxygen saturations were monitored continuously. The                            Endoscope was introduced through the mouth, and                            advanced to the second part of duodenum. The upper                            GI  endoscopy was accomplished without difficulty.                            The patient tolerated the procedure well. Scope In: Scope Out: Findings:                 Minimal inflammation characterized by erythema was                            found in the gastric antrum. Biopsies were taken                            with a cold forceps for histology.                           The exam was otherwise without abnormality. The                            duodenum was biopsied throughout to check for                            Celiac Sprue Complications:            No immediate complications. Estimated blood loss:                            None. Estimated Blood Loss:     Estimated blood loss: none. Impression:               - Mild gastritis, biopsied to check for H. pylori.                           - Normal duodenum, biopsied to check for Celiac                            Sprue.                           - The examination was otherwise normal. Recommendation:           - Patient has a contact number available for  emergencies. The signs and symptoms of potential                            delayed complications were discussed with the                            patient. Return to normal activities tomorrow.                            Written discharge instructions were provided to the                            patient.                           - Resume previous diet.                           - Continue present medications.                           - Await pathology results. Milus Banister, MD 07/22/2020 9:57:04 AM This report has been signed electronically.

## 2020-07-22 NOTE — Patient Instructions (Signed)
Handouts given for Gastritis and Polyps.  YOU HAD AN ENDOSCOPIC PROCEDURE TODAY AT [facility]:   Refer to the procedure report that was given to you for any specific questions about what was found during the examination.  If the procedure report does not answer your questions, please call your gastroenterologist to clarify.  If you requested that your care partner not be given the details of your procedure findings, then the procedure report has been included in a sealed envelope for you to review at your convenience later.  YOU SHOULD EXPECT: Some feelings of bloating in the abdomen. Passage of more gas than usual.  Walking can help get rid of the air that was put into your GI tract during the procedure and reduce the bloating. If you had a lower endoscopy (such as a colonoscopy or flexible sigmoidoscopy) you may notice spotting of blood in your stool or on the toilet paper. If you underwent a bowel prep for your procedure, you may not have a normal bowel movement for a few days.  Please Note:  You might notice some irritation and congestion in your nose or some drainage.  This is from the oxygen used during your procedure.  There is no need for concern and it should clear up in a day or so.  SYMPTOMS TO REPORT IMMEDIATELY:   Following lower endoscopy (colonoscopy or flexible sigmoidoscopy):  Excessive amounts of blood in the stool  Significant tenderness or worsening of abdominal pains  Swelling of the abdomen that is new, acute  Fever of 100F or higher   Following upper endoscopy (EGD)  Vomiting of blood or coffee ground material  New chest pain or pain under the shoulder blades  Painful or persistently difficult swallowing  New shortness of breath  Fever of 100F or higher  Black, tarry-looking stools  For urgent or emergent issues, a gastroenterologist can be reached at any hour by calling [phone]. Do not use MyChart messaging for urgent concerns.    DIET:   We do recommend a small meal at first, but then you may proceed to your regular diet.  Drink plenty of fluids but you should avoid alcoholic beverages for 24 hours.  ACTIVITY:  You should plan to take it easy for the rest of today and you should NOT DRIVE or use heavy machinery until tomorrow (because of the sedation medicines used during the test).    FOLLOW UP: Our staff will call the number listed on your records 48-72 hours following your procedure to check on you and address any questions or concerns that you may have regarding the information given to you following your procedure. If we do not reach you, we will leave a message.  We will attempt to reach you two times.  During this call, we will ask if you have developed any symptoms of COVID 19. If you develop any symptoms (ie: fever, flu-like symptoms, shortness of breath, cough etc.) before then, please call [phone].  If you test positive for Covid 19 in the 2 weeks post procedure, please call and report this information to [place].    If any biopsies were taken you will be contacted by phone or by letter within the next 1-3 weeks.  Please call us at [phone] if you have not heard about the biopsies in 3 weeks.    SIGNATURES/CONFIDENTIALITY: You and/or your care partner have signed paperwork which will be entered into your electronic medical record.  These signatures attest to the  fact that that the information above on your After Visit Summary has been reviewed and is understood.  Full responsibility of the confidentiality of this discharge information lies with you and/or your care-partner.

## 2020-07-22 NOTE — Progress Notes (Signed)
Report to PACU, RN, vss, BBS= Clear.  

## 2020-07-22 NOTE — Op Note (Signed)
[name] Procedure Date: 07/22/2020 9:30 AM MRN: [id number] Endoscopist: Milus Banister , MD Age: 24 Referring MD:  Date of Birth: 08-16-1995 Gender: Female Account #: [id number] Procedure:                Colonoscopy Indications:              Chronic diarrhea Medicines:                Monitored Anesthesia Care Procedure:                Pre-Anesthesia Assessment:                           - Prior to the procedure, a History and Physical                            was performed, and patient medications and                            allergies were reviewed. The patient's tolerance of                            previous anesthesia was also reviewed. The risks                            and benefits of the procedure and the sedation                            options and risks were discussed with the patient.                            All questions were answered, and informed consent                            was obtained. Prior Anticoagulants: The patient has                            taken no previous anticoagulant or antiplatelet                            agents. ASA Grade Assessment: II - A patient with                            mild systemic disease. After reviewing the risks                            and benefits, the patient was deemed in                            satisfactory condition to undergo the procedure.                           After obtaining informed consent, the colonoscope  was passed under direct vision. Throughout the                            procedure, the patient's blood pressure, pulse, and                            oxygen saturations were monitored continuously. The                            Colonoscope was introduced through the anus and                            advanced to the the terminal ileum. The colonoscopy                            was performed without difficulty. The  patient                            tolerated the procedure well. The quality of the                            bowel preparation was good. The terminal ileum,                            ileocecal valve, appendiceal orifice, and rectum                            were photographed. Scope In: 9:33:38 AM Scope Out: 9:44:03 AM Scope Withdrawal Time: 0 hours 7 minutes 57 seconds  Total Procedure Duration: 0 hours 10 minutes 25 seconds  Findings:                 The terminal ileum appeared normal.                           A 11 mm polyp was found in the ascending colon. The                            polyp was sessile. The polyp was removed with a                            cold snare. Resection and retrieval were complete.                           Biopsies for histology were taken with a cold                            forceps randomly from the entire colon (normal                            mucosa throughout) for evaluation of microscopic                            colitis.  The exam was otherwise without abnormality on                            direct and retroflexion views. Complications:            No immediate complications. Estimated blood loss:                            None. Estimated Blood Loss:     Estimated blood loss: none. Impression:               - The examined portion of the ileum was normal.                           - One 11 mm polyp in the ascending colon, removed                            with a cold snare. Resected and retrieved.                           - The examination was otherwise normal on direct                            and retroflexion views.                           - Biopsies were taken randomly with a cold forceps                            from the entire colon for evaluation of microscopic                            colitis. Recommendation:           - EGD now.                           - Await pathology results. Milus Banister, MD 07/22/2020 9:54:33 AM This report has been signed electronically.

## 2020-07-23 DIAGNOSIS — F603 Borderline personality disorder: Secondary | ICD-10-CM | POA: Diagnosis not present

## 2020-07-24 ENCOUNTER — Telehealth: Payer: Self-pay

## 2020-07-24 ENCOUNTER — Telehealth: Payer: Self-pay | Admitting: *Deleted

## 2020-07-24 NOTE — Telephone Encounter (Signed)
  Follow up Call-  Call back number 07/22/2020  Post procedure Call Back phone  # [phone] cell  Permission to leave phone message Yes  Some recent data might be hidden     Patient questions:  Do you have a fever, pain , or abdominal swelling? No. Pain Score  0 *  Have you tolerated food without any problems? Yes.    Have you been able to return to your normal activities? Yes.    Do you have any questions about your discharge instructions: Diet   No. Medications  No. Follow up visit  No.  Do you have questions or concerns about your Care? No.  Actions: * If pain score is 4 or above: No action needed, pain <4.  1. Have you developed a fever since your procedure? no  2.   Have you had an respiratory symptoms (SOB or cough) since your procedure? no  3.   Have you tested positive for COVID 19 since your procedure no  4.   Have you had any family members/close contacts diagnosed with the COVID 19 since your procedure?  no   If yes to any of these questions please route to Joylene John, RN and Joella Prince, RN

## 2020-07-24 NOTE — Telephone Encounter (Signed)
  Follow up Call-   Called [phone] and left a message we tried to reach pt for a follow up call. maw  Call back number 07/22/2020  Post procedure Call Back phone  # [phone] cell  Permission to leave phone message Yes  Some recent data might be hidden

## 2020-07-28 DIAGNOSIS — F603 Borderline personality disorder: Secondary | ICD-10-CM | POA: Diagnosis not present

## 2020-07-29 ENCOUNTER — Other Ambulatory Visit: Payer: Self-pay

## 2020-07-29 ENCOUNTER — Ambulatory Visit (INDEPENDENT_AMBULATORY_CARE_PROVIDER_SITE_OTHER): Payer: BC Managed Care – PPO | Admitting: Allergy & Immunology

## 2020-07-29 ENCOUNTER — Encounter: Payer: Self-pay | Admitting: Allergy & Immunology

## 2020-07-29 VITALS — BP 102/62 | HR 90 | Temp 98.0°F | Resp 18 | Ht 61.0 in | Wt 144.4 lb

## 2020-07-29 DIAGNOSIS — R232 Flushing: Secondary | ICD-10-CM

## 2020-07-29 DIAGNOSIS — J45990 Exercise induced bronchospasm: Secondary | ICD-10-CM

## 2020-07-29 DIAGNOSIS — I7381 Erythromelalgia: Secondary | ICD-10-CM

## 2020-07-29 DIAGNOSIS — B999 Unspecified infectious disease: Secondary | ICD-10-CM

## 2020-07-29 NOTE — Progress Notes (Signed)
FOLLOW UP  Date of Service/Encounter:  07/29/20   Assessment:   Exercise-induced asthma  Seasonal and perennial allergic rhinitis(weeds, indoor molds, dust mites and cat)  Food intolerance- with bloating and diarrhea (negative testing) and largely normal endoscopy and colonoscopy  Flushingwith perceived heart rate changes - normal cardiac workup  Flushing versus urticaria - without improvement with three months of Xolair  ? erythromelalgia   Plan/Recommendations:   1. Exercise-induced asthma - Continue with albuterol as needed.  2. Chronic rhinitis (weeds, indoor molds, dust mites and cat) - Continue with: Benadryl as needed - You can use an extra dose of the antihistamine, if needed, for breakthrough symptoms.   3. Flushing - erythromelalgia  - We are going to refer you to see Dermatology at [facility]. - Hopefully they will know where to send you.  - We are going to stop the Xolair since that has not worked at all.   4. Return if symptoms worsen or fail to improve.   Subjective:   Candace Shaw is a 24 y.o. female presenting today for follow up of  Chief Complaint  Patient presents with  . Asthma  . Sinus Problem  . flushing     Candace Shaw has a history of the following: Patient Active Problem List   Diagnosis Date Noted  . Snake bite 02/16/2018  . Acute lymphadenitis of lower extremity 02/16/2018  . Nonallopathic lesion of thoracic region 06/30/2016  . Nonallopathic lesion of rib cage 06/30/2016  . Nonallopathic lesion of cervical region 06/30/2016  . Scapular dyskinesis 04/29/2016  . Subacromial bursitis 04/29/2016  . Substance abuse in remission ([place]) 03/28/2015  . Headache(784.0) 12/14/2013  . OCP (oral contraceptive pills) initiation 08/15/2013  . Smoker 02/13/2013  . Depression 12/05/2012  . Tobacco user 08/19/2012  . Exercise-induced asthma 04/03/2011    History obtained from: chart review and patient.  Candace Shaw is a 24  y.o. female presenting for a follow up visit.  She was last seen in October 2021.  She has an interesting history of intermittent flushing and redness over her entire body as well as concurrent GI distress.  I first saw her in August 2021.  Diagnosed her with exercise-induced bronchospasm and she had environmental testing that was positive to weeds, indoor molds, dust mite, and cat.  Her symptoms were controlled with Benadryl as needed.  We also did testing to avoid variety of foods given her GI symptoms and all these were negative.  With her flushing and heart rate changes, I was concerned with possible POTS.  We sent her to cardiology for work-up.  She did have seen cardiology who feel that it was POTS.  They did do a Holter monitor to ended up being normal.  We also sent a slew of labs including urine studies for carcinoid syndrome and pheochromocytoma.  These were normal.  We sent a alpha gal panel as well as protein electrophoresis which were normal.  The serum tryptase was normal.  She has previously had an extensive autoimmune work-up in April 2021 that was normal.    We did do an immune work-up and she was only protective to 11 out of 23 serotypes of Streptococcus pneumonia.  The remainder of her work-up for her immune system was normal.  She did receive a Pneumovax here in our office and needs repeat titers drawn today.  At the last visit, we decided to trial Xolair in case this was a manifestation of idiopathic anaphylaxis.  She has since received  3 injections.  She also is going to see GI.  GI removed some polyps during her colonoscopy and diagnosed her with gastritis.  She does not have a follow-up with them yet.  The Xolair injections have gone fine, but they have not really change the frequency of her reactions.  Today, she brings up the diagnosis of erythromelalgia.  She reports that she has been on a lot of writing groups and has shared the pictures of her hands and feet.  She is wondering  whether she needs a referral to an academic Medical Center for further management, which I agree with.  She has not done much research regarding where this diagnosis is best handled but she is fine with either [facility] or [facility]. In fact, she is actually going to be starting school at [employer] next fall (undergraduate). So something in the [employer] is going to be the most useful.   Candace Shaw's asthma has been well controlled. She has not required rescue medication, experienced nocturnal awakenings due to lower respiratory symptoms, nor have activities of daily living been limited. She has required no Emergency Department or Urgent Care visits for her asthma. She has required zero courses of systemic steroids for asthma exacerbations since the last visit. ACT score today is 25, indicating excellent asthma symptom control.   Otherwise, there have been no changes to her past medical history, surgical history, family history, or social history.    Review of Systems  Constitutional: Negative.  Negative for chills, fever, malaise/fatigue and weight loss.  HENT: Negative for congestion, ear discharge, ear pain, sinus pain and sore throat.   Eyes: Negative for pain, discharge and redness.  Respiratory: Negative for cough, sputum production, shortness of breath and wheezing.   Cardiovascular: Negative.  Negative for chest pain and palpitations.  Gastrointestinal: Positive for abdominal pain, diarrhea and nausea. Negative for heartburn and vomiting.  Skin: Positive for rash. Negative for itching.  Neurological: Negative for dizziness and headaches.  Endo/Heme/Allergies: Negative for environmental allergies. Does not bruise/bleed easily.       Objective:   Blood pressure 102/62, pulse 90, temperature 98 F (36.7 C), temperature source Temporal, resp. rate 18, height 5\' 1"  (1.549 m), weight 144 lb 6.4 oz (65.5 kg), last menstrual period 07/01/2020, SpO2 97 %. Body mass index is 27.28  kg/m.   Physical Exam:  Physical Exam Constitutional:      Appearance: She is well-developed.  HENT:     Head: Normocephalic and atraumatic.     Right Ear: Tympanic membrane, ear canal and external ear normal.     Left Ear: Tympanic membrane, ear canal and external ear normal.     Nose: No nasal deformity, septal deviation, mucosal edema, rhinorrhea or epistaxis.     Right Turbinates: Enlarged. Not swollen.     Left Turbinates: Enlarged. Not swollen.     Right Sinus: No maxillary sinus tenderness or frontal sinus tenderness.     Left Sinus: No maxillary sinus tenderness or frontal sinus tenderness.     Mouth/Throat:     Mouth: Oropharynx is clear and moist. Mucous membranes are not pale and not dry.     Pharynx: Uvula midline.  Eyes:     General:        Right eye: No discharge.        Left eye: No discharge.     Extraocular Movements: EOM normal.     Conjunctiva/sclera: Conjunctivae normal.     Right eye: Right conjunctiva is not  injected. No chemosis.    Left eye: Left conjunctiva is not injected. No chemosis.    Pupils: Pupils are equal, round, and reactive to light.  Cardiovascular:     Rate and Rhythm: Normal rate and regular rhythm.     Heart sounds: Normal heart sounds.  Pulmonary:     Effort: Pulmonary effort is normal. No tachypnea, accessory muscle usage or respiratory distress.     Breath sounds: Normal breath sounds. No wheezing, rhonchi or rales.     Comments: Moving air well in all lung fields. No increased work of breathing noted.  Chest:     Chest wall: No tenderness.  Lymphadenopathy:     Cervical: No cervical adenopathy.  Skin:    General: Skin is warm.     Capillary Refill: Capillary refill takes less than 2 seconds.     Coloration: Skin is not pale.     Findings: No abrasion, erythema, petechiae or rash. Rash is not papular, urticarial or vesicular.     Comments: Fair skin. No urticarial lesions noted today.   Neurological:     Mental Status: She is  alert.  Psychiatric:        Mood and Affect: Mood and affect normal.      Diagnostic studies: labs sent instead     Salvatore Marvel, MD  [facility]

## 2020-07-29 NOTE — Patient Instructions (Addendum)
1. Exercise-induced asthma - Continue with albuterol as needed.  2. Chronic rhinitis (weeds, indoor molds, dust mites and cat) - Continue with: Benadryl as needed - You can use an extra dose of the antihistamine, if needed, for breakthrough symptoms.   3. Flushing - erythromelalgia  - We are going to refer you to see Dermatology at [facility]. - Hopefully they will know where to send you.  - We are going to stop the Xolair since that has not worked at all.   4. Return if symptoms worsen or fail to improve.    Please inform us of any Emergency Department visits, hospitalizations, or changes in symptoms. Call us before going to the ED for breathing or allergy symptoms since we might be able to fit you in for a sick visit. Feel free to contact us anytime with any questions, problems, or concerns.  It was a pleasure to see you again today!  Websites that have reliable patient information: 1. American Academy of Asthma, Allergy, and Immunology: www.aaaai.org 2. Food Allergy Research and Education (FARE): foodallergy.org 3. Mothers of Asthmatics: http://www.asthmacommunitynetwork.org 4. American College of Allergy, Asthma, and Immunology: www.acaai.org   COVID-19 Vaccine Information can be found at: ShippingScam.co.uk For questions related to vaccine distribution or appointments, please email [email or web address] or call [phone].     Like [place] on [place] and Instagram for our latest updates!        Make sure you are registered to vote! If you have moved or changed any of your contact information, you will need to get this updated before voting!  In some cases, you MAY be able to register to vote online: [email or web address]

## 2020-07-30 ENCOUNTER — Encounter: Payer: Self-pay | Admitting: Gastroenterology

## 2020-07-31 ENCOUNTER — Encounter: Payer: Self-pay | Admitting: Allergy & Immunology

## 2020-08-04 DIAGNOSIS — F603 Borderline personality disorder: Secondary | ICD-10-CM | POA: Diagnosis not present

## 2020-08-05 ENCOUNTER — Telehealth: Payer: Self-pay

## 2020-08-05 LAB — STREP PNEUMONIAE 23 SEROTYPES IGG
Pneumo Ab Type 1*: 4.6 ug/mL (ref >1.3)
Pneumo Ab Type 12 (12F)*: 0.7 ug/mL — ABNORMAL LOW (ref >1.3)
Pneumo Ab Type 14*: 2.3 ug/mL (ref >1.3)
Pneumo Ab Type 17 (17F)*: 8 ug/mL (ref >1.3)
Pneumo Ab Type 19 (19F)*: 5.8 ug/mL (ref >1.3)
Pneumo Ab Type 2*: 6.6 ug/mL (ref >1.3)
Pneumo Ab Type 20*: 13.9 ug/mL (ref >1.3)
Pneumo Ab Type 22 (22F)*: 6.2 ug/mL (ref >1.3)
Pneumo Ab Type 23 (23F)*: 3.1 ug/mL (ref >1.3)
Pneumo Ab Type 26 (6B)*: 2.2 ug/mL (ref >1.3)
Pneumo Ab Type 3*: 15.7 ug/mL (ref >1.3)
Pneumo Ab Type 34 (10A)*: 12 ug/mL (ref >1.3)
Pneumo Ab Type 4*: 2.5 ug/mL (ref >1.3)
Pneumo Ab Type 43 (11A)*: 4.6 ug/mL (ref >1.3)
Pneumo Ab Type 5*: 13 ug/mL (ref >1.3)
Pneumo Ab Type 51 (7F)*: 7.3 ug/mL (ref >1.3)
Pneumo Ab Type 54 (15B)*: 14 ug/mL (ref >1.3)
Pneumo Ab Type 56 (18C)*: 1.3 ug/mL — ABNORMAL LOW (ref >1.3)
Pneumo Ab Type 57 (19A)*: 13.7 ug/mL (ref >1.3)
Pneumo Ab Type 68 (9V)*: 3.2 ug/mL (ref >1.3)
Pneumo Ab Type 70 (33F)*: >13.6 ug/mL (ref >1.3)
Pneumo Ab Type 8*: >25.3 ug/mL (ref >1.3)
Pneumo Ab Type 9 (9N)*: 10.6 ug/mL (ref >1.3)

## 2020-08-05 LAB — ANA+ENA+DNA/DS+SCL 70+SJOSSA/B
ANA Titer 1: NEGATIVE
ENA RNP Ab: <0.2 AI (ref 0.0–0.9)
ENA SM Ab Ser-aCnc: <0.2 AI (ref 0.0–0.9)
ENA SSA (RO) Ab: <0.2 AI (ref 0.0–0.9)
ENA SSB (LA) Ab: <0.2 AI (ref 0.0–0.9)
Scleroderma (Scl-70) (ENA) Antibody, IgG: <0.2 AI (ref 0.0–0.9)
dsDNA Ab: 3 IU/mL (ref 0–9)

## 2020-08-05 NOTE — Telephone Encounter (Signed)
Candace Shaw should I cancel her appointment for 08/15/20?   Thanks

## 2020-08-05 NOTE — Telephone Encounter (Signed)
Yes go ahead and cancel appt

## 2020-08-05 NOTE — Telephone Encounter (Signed)
-----   Message from Alfonse Spruce, MD sent at 07/31/2020 10:28 PM EST ----- Tam Tam  - we are stopping the Xolair since it did not seem to help.   Dee - I think I routed this to you already, but I need a referral to [facility] for Dermatology.

## 2020-08-05 NOTE — Telephone Encounter (Signed)
Thanks much!  Nneoma Harral, MD [facility]  

## 2020-08-11 DIAGNOSIS — F603 Borderline personality disorder: Secondary | ICD-10-CM | POA: Diagnosis not present

## 2020-08-15 ENCOUNTER — Ambulatory Visit: Payer: Self-pay

## 2020-08-18 DIAGNOSIS — F603 Borderline personality disorder: Secondary | ICD-10-CM | POA: Diagnosis not present

## 2020-08-27 ENCOUNTER — Telehealth: Payer: Self-pay | Admitting: Internal Medicine

## 2020-08-27 NOTE — Telephone Encounter (Signed)
Patient is calling and stated that she just tested positive for covid and wanted to see if she can get the monoclinal infusion, please advise. CB is [phone]

## 2020-08-27 NOTE — Telephone Encounter (Signed)
Patient informed of the message below.  Appt scheduled for 1/20 to discuss test and note for school.

## 2020-08-27 NOTE — Telephone Encounter (Signed)
fortunately she is not high risk for complications .  [facility] referrals currently   Is only advised for high risk conditions    She can make video visit to assess further  And discuss

## 2020-08-27 NOTE — Progress Notes (Signed)
Virtual Visit via Video Note  I connected with@ on jan20 22 at  9:30 AM EST by a video enabled telemedicine application and verified that I am speaking with the correct person using two identifiers. Location patient: home Location provider: home office Persons participating in the virtual visit: patient, provider  WIth national recommendations  regarding COVID 19 pandemic   video visit is advised over in office visit for this patient.  Patient aware  of the limitations of evaluation and management by telemedicine and  availability of in person appointments. and agreed to proceed.   HPI: Candace Shaw presents for video visit  For  Sx and positive covid testsing   ( see message)   Onset with sore  throat  and then ha  Minor cough fatigue achy ( thought flushing was her baseline)  Recently returned from funeral of biological brother in [place] and many tested positive  After that  developed st congestion  No sob  Has some left over fluvoxamine.  Is in school needs note for school  Day 5 of sx   ROS: See pertinent positives and negatives per HPI.   Past Medical History:  Diagnosis Date  . Alcoholism ([place])   . Anemia   . Asthma   . Bipolar disorder ([place])   . Depression   . Hallucinogen dependence, unspecified 10/03/2012  . Headache(784.0)   . Obesity   . Personality disorder ([place])   . Substance abuse ([place])    hallucengins & alcohol - in remission  . Urticaria     Past Surgical History:  Procedure Laterality Date  . WISDOM TOOTH EXTRACTION  Age 19    Family History  Adopted: Yes  Problem Relation Age of Onset  . Alcohol abuse Mother   . Alcohol abuse Father     Social History   Tobacco Use  . Smoking status: Former Smoker    Packs/day: 0.50    Years: 4.00    Pack years: 2.00    Types: Cigarettes    Quit date: 12/14/2018    Years since quitting: 1.7  . Smokeless tobacco: Never Used  Vaping Use  . Vaping Use: Every day  . Substances: Nicotine  Substance  Use Topics  . Alcohol use: Not Currently    Comment: sober since 2014  . Drug use: Not Currently    Types: Marijuana    Comment: alcoholism, hallucingen - dependancy - in remission      Current Outpatient Medications:  .  albuterol (VENTOLIN HFA) 108 (90 Base) MCG/ACT inhaler, Inhale 1-2 puffs into the lungs every 6 (six) hours as needed for wheezing or shortness of breath (pre exercise)., Disp: 18 g, Rfl: 1 .  diphenhydramine-acetaminophen (TYLENOL PM) 25-500 MG TABS tablet, Take 1 tablet by mouth at bedtime as needed., Disp: , Rfl:  .  EPINEPHrine (AUVI-Q) 0.3 mg/0.3 mL IJ SOAJ injection, Inject 0.3 mg into the muscle as needed for anaphylaxis. (Patient not taking: No sig reported), Disp: 2 each, Rfl: 1 .  fluvoxaMINE (LUVOX) 25 MG tablet, Take 25 mg by mouth at bedtime., Disp: , Rfl:  .  gabapentin (NEURONTIN) 300 MG capsule, Take 300 mg by mouth at bedtime., Disp: , Rfl:  .  hydrOXYzine (VISTARIL) 25 MG capsule, Take 25-50 mg by mouth daily as needed., Disp: , Rfl:  .  Ibuprofen 200 MG CAPS, Take by mouth 2 (two) times daily as needed. , Disp: , Rfl:  .  lamoTRIgine (LAMICTAL) 100 MG tablet, Take 300 mg by mouth  at bedtime., Disp: , Rfl:  .  methylphenidate (RITALIN) 10 MG tablet, as needed. , Disp: , Rfl:  .  VYVANSE 20 MG capsule, Take 20 mg by mouth as needed.  (Patient not taking: Reported on 07/29/2020), Disp: , Rfl:   Current Facility-Administered Medications:  .  omalizumab Arvid Right) injection 300 mg, 300 mg, Subcutaneous, Q28 days, Valentina Shaggy, MD, 300 mg at 07/18/20 0913  EXAM: BP Readings from Last 3 Encounters:  07/29/20 102/62  07/22/20 114/76  07/11/20 104/66    VITALS per patient if applicable:  GENERAL: alert, oriented, appears well and in no acute distress  HEENT: atraumatic, conjunttiva clear, no obvious abnormalities on inspection of external nose and ears  NECK: normal movements of the head and neck  LUNGS: on inspection no signs of respiratory  distress, breathing rate appears normal, no obvious gross SOB, gasping or wheezing  Congestion  Non toxic   CV: no obvious cyanosis  MS: moves all visible extremities without noticeable abnormality  PSYCH/NEURO: pleasant and cooperative, no obvious depression or anxiety, speech and thought processing grossly intact Lab Results  Component Value Date   WBC 5.3 05/13/2020   HGB 14.0 05/13/2020   HCT 41.6 05/13/2020   PLT 287.0 09/25/2019   GLUCOSE 81 11/16/2019   CHOL 176 07/09/2019   TRIG 75.0 07/09/2019   HDL 52.90 07/09/2019   LDLCALC 108 (H) 07/09/2019   ALT 11 11/16/2019   AST 13 11/16/2019   NA 136 11/16/2019   K 4.1 11/16/2019   CL 102 11/16/2019   CREATININE 0.71 11/16/2019   BUN 11 11/16/2019   CO2 24 11/16/2019   TSH 1.79 07/09/2019   HGBA1C 5.2 07/09/2019    ASSESSMENT AND PLAN:  Discussed the following assessment and plan:    ICD-10-CM   1. Respiratory tract infection due to COVID-19 virus  U07.1    J98.8   2. Counseled about COVID-19 virus infection  Z71.89   rapid test positive  Exposure and classic sx  No need to get pcr test to confirm  Supportive care discussed   Note for school to return jan 26 wed assuming improvement  As expected  Ok to try the fluvoxamine she has at home if wishes   Counseled.  30 minutes review counsel   Plan    Expectant management and discussion of plan and treatment with opportunity to ask questions and all were answered. The patient agreed with the plan and demonstrated an understanding of the instructions.  review    advised to call back or seek an in-person evaluation if worsening  or having  further concerns . In interim.  Return if symptoms worsen or fail to improve as expected.    Shanon Ace, MD

## 2020-08-28 ENCOUNTER — Telehealth (INDEPENDENT_AMBULATORY_CARE_PROVIDER_SITE_OTHER): Payer: BC Managed Care – PPO | Admitting: Internal Medicine

## 2020-08-28 DIAGNOSIS — J988 Other specified respiratory disorders: Secondary | ICD-10-CM

## 2020-08-28 DIAGNOSIS — Z7189 Other specified counseling: Secondary | ICD-10-CM | POA: Diagnosis not present

## 2020-08-28 DIAGNOSIS — U071 COVID-19: Secondary | ICD-10-CM

## 2020-09-01 DIAGNOSIS — F603 Borderline personality disorder: Secondary | ICD-10-CM | POA: Diagnosis not present

## 2020-09-08 DIAGNOSIS — F603 Borderline personality disorder: Secondary | ICD-10-CM | POA: Diagnosis not present

## 2020-09-15 DIAGNOSIS — F603 Borderline personality disorder: Secondary | ICD-10-CM | POA: Diagnosis not present

## 2020-10-01 ENCOUNTER — Other Ambulatory Visit: Payer: Self-pay

## 2020-10-01 ENCOUNTER — Encounter (HOSPITAL_COMMUNITY): Payer: Self-pay | Admitting: Emergency Medicine

## 2020-10-01 ENCOUNTER — Ambulatory Visit (HOSPITAL_COMMUNITY)
Admission: EM | Admit: 2020-10-01 | Discharge: 2020-10-01 | Disposition: A | Discharge status: Home / Self Care | Payer: BC Managed Care – PPO

## 2020-10-01 DIAGNOSIS — S61412A Laceration without foreign body of left hand, initial encounter: Secondary | ICD-10-CM | POA: Diagnosis not present

## 2020-10-01 DIAGNOSIS — M79642 Pain in left hand: Secondary | ICD-10-CM

## 2020-10-01 MED ORDER — LIDOCAINE-EPINEPHRINE 1 %-1:100000 IJ SOLN
INTRAMUSCULAR | Status: AC
  Filled 2020-10-01: qty 1

## 2020-10-01 NOTE — ED Provider Notes (Signed)
[place]   MRN: [id number] DOB: 1995/11/28  Subjective:   Candace Shaw is a 25 y.o. female presenting for suffering a left hand laceration today.  Patient was trying to slice recorder and accidentally cut her left hand.  Denies loss of range of motion, loss of sensation.  Her Tdap is up-to-date.  She came straight to our clinic, no medicines prior to arrival.   Current Facility-Administered Medications:  .  omalizumab Arvid Right) injection 300 mg, 300 mg, Subcutaneous, Q28 days, Valentina Shaggy, MD, 300 mg at 07/18/20 3662  Current Outpatient Medications:  .  diphenhydramine-acetaminophen (TYLENOL PM) 25-500 MG TABS tablet, Take 1 tablet by mouth at bedtime as needed., Disp: , Rfl:  .  hydrOXYzine (VISTARIL) 25 MG capsule, Take 25-50 mg by mouth daily as needed., Disp: , Rfl:  .  albuterol (VENTOLIN HFA) 108 (90 Base) MCG/ACT inhaler, Inhale 1-2 puffs into the lungs every 6 (six) hours as needed for wheezing or shortness of breath (pre exercise)., Disp: 18 g, Rfl: 1 .  EPINEPHrine (AUVI-Q) 0.3 mg/0.3 mL IJ SOAJ injection, Inject 0.3 mg into the muscle as needed for anaphylaxis. (Patient not taking: No sig reported), Disp: 2 each, Rfl: 1 .  fluvoxaMINE (LUVOX) 25 MG tablet, Take 25 mg by mouth at bedtime., Disp: , Rfl:  .  gabapentin (NEURONTIN) 300 MG capsule, Take 300 mg by mouth at bedtime., Disp: , Rfl:  .  Ibuprofen 200 MG CAPS, Take by mouth 2 (two) times daily as needed. , Disp: , Rfl:  .  lamoTRIgine (LAMICTAL) 100 MG tablet, Take 300 mg by mouth at bedtime., Disp: , Rfl:  .  methylphenidate (RITALIN) 10 MG tablet, as needed. , Disp: , Rfl:  .  VYVANSE 20 MG capsule, Take 20 mg by mouth as needed.  (Patient not taking: Reported on 07/29/2020), Disp: , Rfl:    Allergies  Allergen Reactions  . Ambien [Zolpidem Tartrate]     Hallucinations    Past Medical History:  Diagnosis Date  . Alcoholism ([place])   . Anemia   . Asthma   . Bipolar disorder  ([place])   . Depression   . Hallucinogen dependence, unspecified 10/03/2012  . Headache(784.0)   . Obesity   . Personality disorder ([place])   . Substance abuse ([place])    hallucengins & alcohol - in remission  . Urticaria      Past Surgical History:  Procedure Laterality Date  . WISDOM TOOTH EXTRACTION  Age 44    Family History  Adopted: Yes  Problem Relation Age of Onset  . Alcohol abuse Mother   . Alcohol abuse Father     Social History   Tobacco Use  . Smoking status: Former Smoker    Packs/day: 0.50    Years: 4.00    Pack years: 2.00    Types: Cigarettes    Quit date: 12/14/2018    Years since quitting: 1.8  . Smokeless tobacco: Never Used  Vaping Use  . Vaping Use: Every day  . Substances: Nicotine  Substance Use Topics  . Alcohol use: Not Currently    Comment: sober since 2014  . Drug use: Not Currently    Types: Marijuana    Comment: alcoholism, hallucingen - dependancy - in remission    ROS   Objective:   Vitals: BP 112/74 (BP Location: Right Arm)   Pulse 92   Temp 98.5 F (36.9 C) (Oral)   Resp 18   LMP 08/31/2020  SpO2 98%   Physical Exam Constitutional:      General: She is not in acute distress.    Appearance: Normal appearance. She is well-developed. She is not ill-appearing.  HENT:     Head: Normocephalic and atraumatic.     Nose: Nose normal.     Mouth/Throat:     Mouth: Mucous membranes are moist.     Pharynx: Oropharynx is clear.  Eyes:     General: No scleral icterus.    Extraocular Movements: Extraocular movements intact.     Pupils: Pupils are equal, round, and reactive to light.  Cardiovascular:     Rate and Rhythm: Normal rate.  Pulmonary:     Effort: Pulmonary effort is normal.  Musculoskeletal:       Hands:  Skin:    General: Skin is warm and dry.  Neurological:     General: No focal deficit present.     Mental Status: She is alert and oriented to person, place, and time.  Psychiatric:        Mood and Affect: Mood  normal.        Behavior: Behavior normal.     PROCEDURE NOTE: laceration repair Verbal consent obtained from patient.  Local anesthesia with 5cc Lidocaine 1% with epinephrine.  Wound explored for tendon, ligament damage. Wound scrubbed with soap and water and rinsed. Wound closed with #3 5-0 Ethilon (2 horizontal mattress, 1 simple interrupted) sutures.  Wound cleansed and dressed.   Assessment and Plan :   PDMP not reviewed this encounter.  1. Left hand pain   2. Laceration of left hand without foreign body, initial encounter     Laceration repaired successfully. Wound care reviewed.  Tylenol and/or ibuprofen for pain relief.  Return-to-clinic precautions discussed, patient verbalized understanding. Otherwise, follow up in 10 days for suture removal.     Jaynee Eagles, PA-C 10/01/20 1824

## 2020-10-01 NOTE — Discharge Instructions (Signed)

## 2020-10-01 NOTE — ED Triage Notes (Signed)
Pt presents with laceration to left hand. States cut herself with a knife while slicing an avocado.

## 2020-10-06 DIAGNOSIS — F603 Borderline personality disorder: Secondary | ICD-10-CM | POA: Diagnosis not present

## 2020-10-09 ENCOUNTER — Encounter (HOSPITAL_COMMUNITY): Payer: Self-pay | Admitting: Emergency Medicine

## 2020-10-09 ENCOUNTER — Ambulatory Visit (HOSPITAL_COMMUNITY)
Admission: EM | Admit: 2020-10-09 | Discharge: 2020-10-09 | Disposition: A | Discharge status: Home / Self Care | Payer: BC Managed Care – PPO | Attending: Internal Medicine | Admitting: Internal Medicine

## 2020-10-09 ENCOUNTER — Other Ambulatory Visit: Payer: Self-pay

## 2020-10-09 DIAGNOSIS — Z4802 Encounter for removal of sutures: Secondary | ICD-10-CM

## 2020-10-09 NOTE — ED Triage Notes (Signed)
Pt presents for suture removal. Sutures placed on 10/01/20. Pt denies any drainage or signs of infection. Pt states site is tender to tough.

## 2020-10-13 DIAGNOSIS — F509 Eating disorder, unspecified: Secondary | ICD-10-CM | POA: Diagnosis not present

## 2020-10-13 DIAGNOSIS — F3132 Bipolar disorder, current episode depressed, moderate: Secondary | ICD-10-CM | POA: Diagnosis not present

## 2020-10-13 DIAGNOSIS — F603 Borderline personality disorder: Secondary | ICD-10-CM | POA: Diagnosis not present

## 2020-10-13 DIAGNOSIS — F902 Attention-deficit hyperactivity disorder, combined type: Secondary | ICD-10-CM | POA: Diagnosis not present

## 2020-10-20 DIAGNOSIS — F603 Borderline personality disorder: Secondary | ICD-10-CM | POA: Diagnosis not present

## 2020-10-27 DIAGNOSIS — F603 Borderline personality disorder: Secondary | ICD-10-CM | POA: Diagnosis not present

## 2020-11-03 DIAGNOSIS — F603 Borderline personality disorder: Secondary | ICD-10-CM | POA: Diagnosis not present

## 2020-11-10 DIAGNOSIS — F902 Attention-deficit hyperactivity disorder, combined type: Secondary | ICD-10-CM | POA: Diagnosis not present

## 2020-11-10 DIAGNOSIS — F3132 Bipolar disorder, current episode depressed, moderate: Secondary | ICD-10-CM | POA: Diagnosis not present

## 2020-11-10 DIAGNOSIS — F4312 Post-traumatic stress disorder, chronic: Secondary | ICD-10-CM | POA: Diagnosis not present

## 2020-11-10 DIAGNOSIS — F603 Borderline personality disorder: Secondary | ICD-10-CM | POA: Diagnosis not present

## 2020-11-17 DIAGNOSIS — F603 Borderline personality disorder: Secondary | ICD-10-CM | POA: Diagnosis not present

## 2020-11-24 DIAGNOSIS — F603 Borderline personality disorder: Secondary | ICD-10-CM | POA: Diagnosis not present

## 2020-12-01 DIAGNOSIS — F603 Borderline personality disorder: Secondary | ICD-10-CM | POA: Diagnosis not present

## 2020-12-15 DIAGNOSIS — F603 Borderline personality disorder: Secondary | ICD-10-CM | POA: Diagnosis not present

## 2020-12-16 DIAGNOSIS — F4312 Post-traumatic stress disorder, chronic: Secondary | ICD-10-CM | POA: Diagnosis not present

## 2020-12-16 DIAGNOSIS — F902 Attention-deficit hyperactivity disorder, combined type: Secondary | ICD-10-CM | POA: Diagnosis not present

## 2020-12-16 DIAGNOSIS — F3132 Bipolar disorder, current episode depressed, moderate: Secondary | ICD-10-CM | POA: Diagnosis not present

## 2020-12-16 DIAGNOSIS — F603 Borderline personality disorder: Secondary | ICD-10-CM | POA: Diagnosis not present

## 2020-12-18 DIAGNOSIS — Z124 Encounter for screening for malignant neoplasm of cervix: Secondary | ICD-10-CM | POA: Diagnosis not present

## 2020-12-18 DIAGNOSIS — Z6828 Body mass index (BMI) 28.0-28.9, adult: Secondary | ICD-10-CM | POA: Diagnosis not present

## 2020-12-18 DIAGNOSIS — Z113 Encounter for screening for infections with a predominantly sexual mode of transmission: Secondary | ICD-10-CM | POA: Diagnosis not present

## 2020-12-18 DIAGNOSIS — N926 Irregular menstruation, unspecified: Secondary | ICD-10-CM | POA: Diagnosis not present

## 2020-12-18 DIAGNOSIS — Z01419 Encounter for gynecological examination (general) (routine) without abnormal findings: Secondary | ICD-10-CM | POA: Diagnosis not present

## 2020-12-19 DIAGNOSIS — Z124 Encounter for screening for malignant neoplasm of cervix: Secondary | ICD-10-CM | POA: Diagnosis not present

## 2020-12-22 DIAGNOSIS — F603 Borderline personality disorder: Secondary | ICD-10-CM | POA: Diagnosis not present

## 2020-12-29 DIAGNOSIS — F603 Borderline personality disorder: Secondary | ICD-10-CM | POA: Diagnosis not present

## 2020-12-31 ENCOUNTER — Other Ambulatory Visit: Payer: Self-pay | Admitting: Obstetrics

## 2020-12-31 DIAGNOSIS — N6452 Nipple discharge: Secondary | ICD-10-CM

## 2021-01-12 DIAGNOSIS — F603 Borderline personality disorder: Secondary | ICD-10-CM | POA: Diagnosis not present

## 2021-01-22 ENCOUNTER — Ambulatory Visit
Admission: RE | Admit: 2021-01-22 | Discharge: 2021-01-22 | Disposition: A | Discharge status: Home / Self Care | Payer: BC Managed Care – PPO | Source: Ambulatory Visit | Attending: Obstetrics | Admitting: Obstetrics

## 2021-01-22 ENCOUNTER — Other Ambulatory Visit: Payer: Self-pay

## 2021-01-22 DIAGNOSIS — N6452 Nipple discharge: Secondary | ICD-10-CM

## 2021-01-22 DIAGNOSIS — N6489 Other specified disorders of breast: Secondary | ICD-10-CM | POA: Diagnosis not present

## 2021-01-26 DIAGNOSIS — F603 Borderline personality disorder: Secondary | ICD-10-CM | POA: Diagnosis not present

## 2021-01-27 DIAGNOSIS — F902 Attention-deficit hyperactivity disorder, combined type: Secondary | ICD-10-CM | POA: Diagnosis not present

## 2021-01-27 DIAGNOSIS — F4312 Post-traumatic stress disorder, chronic: Secondary | ICD-10-CM | POA: Diagnosis not present

## 2021-01-27 DIAGNOSIS — F3132 Bipolar disorder, current episode depressed, moderate: Secondary | ICD-10-CM | POA: Diagnosis not present

## 2021-01-27 DIAGNOSIS — F603 Borderline personality disorder: Secondary | ICD-10-CM | POA: Diagnosis not present

## 2021-02-02 DIAGNOSIS — M25551 Pain in right hip: Secondary | ICD-10-CM | POA: Diagnosis not present

## 2021-02-02 DIAGNOSIS — M061 Adult-onset Still's disease: Secondary | ICD-10-CM | POA: Diagnosis not present

## 2021-02-02 DIAGNOSIS — L539 Erythematous condition, unspecified: Secondary | ICD-10-CM | POA: Diagnosis not present

## 2021-02-02 DIAGNOSIS — L986 Other infiltrative disorders of the skin and subcutaneous tissue: Secondary | ICD-10-CM | POA: Diagnosis not present

## 2021-02-02 DIAGNOSIS — M25562 Pain in left knee: Secondary | ICD-10-CM | POA: Diagnosis not present

## 2021-02-02 DIAGNOSIS — G8929 Other chronic pain: Secondary | ICD-10-CM | POA: Diagnosis not present

## 2021-02-02 DIAGNOSIS — F603 Borderline personality disorder: Secondary | ICD-10-CM | POA: Diagnosis not present

## 2021-02-02 DIAGNOSIS — M25561 Pain in right knee: Secondary | ICD-10-CM | POA: Diagnosis not present

## 2021-02-02 DIAGNOSIS — M25552 Pain in left hip: Secondary | ICD-10-CM | POA: Diagnosis not present

## 2021-02-16 DIAGNOSIS — R61 Generalized hyperhidrosis: Secondary | ICD-10-CM | POA: Diagnosis not present

## 2021-02-16 DIAGNOSIS — L539 Erythematous condition, unspecified: Secondary | ICD-10-CM | POA: Diagnosis not present

## 2021-02-16 DIAGNOSIS — F603 Borderline personality disorder: Secondary | ICD-10-CM | POA: Diagnosis not present

## 2021-02-19 ENCOUNTER — Telehealth (INDEPENDENT_AMBULATORY_CARE_PROVIDER_SITE_OTHER): Payer: BC Managed Care – PPO | Admitting: Internal Medicine

## 2021-02-19 ENCOUNTER — Other Ambulatory Visit: Payer: Self-pay

## 2021-02-19 DIAGNOSIS — R509 Fever, unspecified: Secondary | ICD-10-CM

## 2021-02-19 DIAGNOSIS — R21 Rash and other nonspecific skin eruption: Secondary | ICD-10-CM | POA: Diagnosis not present

## 2021-02-19 NOTE — Progress Notes (Signed)
Virtual Visit via Video Note  I connected withNAME@ on 02/19/21 at  9:00 AM EDT by a video enabled telemedicine application and verified that I am speaking with the correct person using two identifiers. Location patient: home Location provider  home office Persons participating in the virtual visit: patient, provider  [place]th national recommendations  regarding COVID 19 pandemic   video visit is advised over in office visit for this patient.  Patient aware  of the limitations of evaluation and management by telemedicine and  availability of in person appointments. and agreed to proceed.   HPI: Candace Shaw presents for video visit for help with ongoing problem now over the last almost year regular fevers at night low-grade to 102 with sweats and extremity rash.  See previous notes has been battling rash off and on for most 3 years and been to multiple specialist with no specific diagnosis. Of note she has had a colonoscopy and did have precancerous polyps and biologic brother died age 46 of COVID having underlying eosinophilic asthma and a PE. 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 She was referred to rheum at [facility] . Has been referred to rheumatology in [place] but will not be able to go because she is beginning classes at [employer] in 3 to 4 weeks and will be living in the [place] area for that time. Is difficult for her to sleep with sweats.  No coughing serious sore throats localizing symptoms otherwise.  Needs infectious disease referral consult rule out other causes.  ROS: See pertinent positives and negatives per HPI.  No specific persistent adenopathy.  Past Medical History:  Diagnosis Date   Alcoholism ([place])    Anemia    Asthma    Bipolar disorder ([place])    Depression    Hallucinogen dependence, unspecified 10/03/2012   Headache(784.0)    Obesity    Personality disorder ([place])     Substance abuse ([place])    hallucengins & alcohol - in remission   Urticaria     Past Surgical History:  Procedure Laterality Date   WISDOM TOOTH EXTRACTION  Age 24    Family History  Adopted: Yes  Problem Relation Age of Onset   Alcohol abuse Mother    Alcohol abuse Father     Social History   Tobacco Use   Smoking status: Former    Packs/day: 0.50    Years: 4.00    Pack years: 2.00    Types: Cigarettes    Quit date: 12/14/2018    Years since quitting: 2.1   Smokeless tobacco: Never  Vaping Use   Vaping Use: Every day   Substances: Nicotine  Substance Use Topics   Alcohol use: Not Currently    Comment: sober since 2014   Drug use: Not Currently    Types: Marijuana    Comment: alcoholism, hallucingen - dependancy - in remission      Current Outpatient Medications:    Amphetamine ER (ADZENYS XR-ODT) 12.5 MG TBED, Adzenys XR-ODT 12.5 mg extended release disintegrating tablet  Take 1 tablet every day by oral route., Disp: , Rfl:    hydrOXYzine (VISTARIL) 25 MG capsule, Take 25-50 mg by mouth daily as needed., Disp: , Rfl:    albuterol (VENTOLIN HFA) 108 (90 Base) MCG/ACT inhaler, Inhale 1-2 puffs into the lungs every 6 (six) hours as needed for wheezing or shortness of breath (pre exercise)., Disp: 18 g, Rfl: 1   diphenhydramine-acetaminophen (TYLENOL PM)  25-500 MG TABS tablet, Take 1 tablet by mouth at bedtime as needed. (Patient not taking: Reported on 02/19/2021), Disp: , Rfl:    EPINEPHrine (AUVI-Q) 0.3 mg/0.3 mL IJ SOAJ injection, Inject 0.3 mg into the muscle as needed for anaphylaxis. (Patient not taking: Reported on 02/19/2021), Disp: 2 each, Rfl: 1   fluvoxaMINE (LUVOX) 25 MG tablet, Take 25 mg by mouth at bedtime. (Patient not taking: Reported on 02/19/2021), Disp: , Rfl:    gabapentin (NEURONTIN) 300 MG capsule, Take 300 mg by mouth at bedtime. (Patient not taking: Reported on 02/19/2021), Disp: , Rfl:    Ibuprofen 200 MG CAPS, Take by mouth 2 (two) times daily as  needed.  (Patient not taking: Reported on 02/19/2021), Disp: , Rfl:    lamoTRIgine (LAMICTAL) 100 MG tablet, Take 300 mg by mouth at bedtime. (Patient not taking: Reported on 02/19/2021), Disp: , Rfl:    methylphenidate (RITALIN) 10 MG tablet, as needed.  (Patient not taking: Reported on 02/19/2021), Disp: , Rfl:    VYVANSE 20 MG capsule, Take 20 mg by mouth as needed. (Patient not taking: Reported on 02/19/2021), Disp: , Rfl:   Current Facility-Administered Medications:    omalizumab Arvid Right) injection 300 mg, 300 mg, Subcutaneous, Q28 days, Valentina Shaggy, MD, 300 mg at 07/18/20 0913  EXAM: BP Readings from Last 3 Encounters:  10/09/20 110/68  10/01/20 112/74  07/29/20 102/62    VITALS per patient if applicable:  GENERAL: alert, oriented, appears well and in no acute distress  HEENT: atraumatic, conjunttiva clear, no obvious abnormalities on inspection of external nose and ears  NECK: normal movements of the head and neck  LUNGS: on inspection no signs of respiratory distress, breathing rate appears normal, no obvious gross SOB, gasping or wheezing  CV: no obvious cyanosis  PSYCH/NEURO: pleasant and cooperative, no obvious depression or anxiety, speech and thought processing grossly intact Lab Results  Component Value Date   WBC 5.3 05/13/2020   HGB 14.0 05/13/2020   HCT 41.6 05/13/2020   PLT 287.0 09/25/2019   GLUCOSE 81 11/16/2019   CHOL 176 07/09/2019   TRIG 75.0 07/09/2019   HDL 52.90 07/09/2019   LDLCALC 108 (H) 07/09/2019   ALT 11 11/16/2019   AST 13 11/16/2019   NA 136 11/16/2019   K 4.1 11/16/2019   CL 102 11/16/2019   CREATININE 0.71 11/16/2019   BUN 11 11/16/2019   CO2 24 11/16/2019   TSH 1.79 07/09/2019   HGBA1C 5.2 07/09/2019    ASSESSMENT AND PLAN:  Discussed the following assessment and plan:    ICD-10-CM   1. Fever, unspecified fever cause  R50.9 Ambulatory referral to Infectious Disease   suspected adult stills need opinion R/O atypical  infectious causes.     2. Rash in adult  R21 Ambulatory referral to Infectious Disease     ID disease referral consult.    She will be leaving for school [employer] in 3 to 4 weeks. Advise get referrals names to rheumatologist at [facility] or [facility] she will not be able to keep appointments in [place] with her class schedule. Other questions as to the best as possible   Daily nightly fevers and sweats documented with fever log taking   sleep is effected  Rashes working diagnosis possible adult stills disease. Consideration of other causes of fevers and sweats. Infectious disease referral for help expectant management discussed. No hx of malignancy    and no hx of  medication change se  Counseled.  Expectant management and discussion of plan and treatment with opportunity to ask questions and all were answered. The patient agreed with the plan and demonstrated an understanding of the instructions. Record review visit counsel and plan  32 minutes  Advised to call back or seek an in-person evaluation if worsening  or having  further concerns . Return for as indicated depending on how doing and consults . I  Shanon Ace, MD

## 2021-02-23 ENCOUNTER — Telehealth: Payer: Self-pay | Admitting: Gastroenterology

## 2021-02-23 DIAGNOSIS — F603 Borderline personality disorder: Secondary | ICD-10-CM | POA: Diagnosis not present

## 2021-02-23 NOTE — Telephone Encounter (Signed)
The pt has a chronic history of diarrhea and bloating with BRBPR.  She states that the bleeding is with a BM and is not very heavy.  She is concerned and would like to see Dr Ardis Hughs. Dr Ardis Hughs has an opening on  7/20 that was offered to the pt but she declined-she states she has exams.  She was then offered an appt with [facility] for 7/28.  She will keep this appt and call back if her bleeding is heavy.

## 2021-02-23 NOTE — Telephone Encounter (Signed)
Left message on machine to call back  

## 2021-03-02 DIAGNOSIS — F603 Borderline personality disorder: Secondary | ICD-10-CM | POA: Diagnosis not present

## 2021-03-05 ENCOUNTER — Ambulatory Visit: Payer: BC Managed Care – PPO | Admitting: Nurse Practitioner

## 2021-03-05 ENCOUNTER — Other Ambulatory Visit (INDEPENDENT_AMBULATORY_CARE_PROVIDER_SITE_OTHER): Payer: BC Managed Care – PPO

## 2021-03-05 ENCOUNTER — Encounter: Payer: Self-pay | Admitting: Nurse Practitioner

## 2021-03-05 VITALS — BP 102/60 | HR 100 | Ht 61.0 in | Wt 148.0 lb

## 2021-03-05 DIAGNOSIS — R197 Diarrhea, unspecified: Secondary | ICD-10-CM

## 2021-03-05 DIAGNOSIS — R140 Abdominal distension (gaseous): Secondary | ICD-10-CM

## 2021-03-05 DIAGNOSIS — R1030 Lower abdominal pain, unspecified: Secondary | ICD-10-CM

## 2021-03-05 DIAGNOSIS — R1010 Upper abdominal pain, unspecified: Secondary | ICD-10-CM

## 2021-03-05 LAB — COMPREHENSIVE METABOLIC PANEL
ALT: 11 U/L (ref 0–35)
AST: 12 U/L (ref 0–37)
Albumin: 4.4 g/dL (ref 3.5–5.2)
Alkaline Phosphatase: 45 U/L (ref 39–117)
BUN: 10 mg/dL (ref 6–23)
CO2: 23 mEq/L (ref 19–32)
Calcium: 9.7 mg/dL (ref 8.4–10.5)
Chloride: 103 mEq/L (ref 96–112)
Creatinine, Ser: 0.64 mg/dL (ref 0.40–1.20)
GFR: 121.16 mL/min (ref >60.00)
Glucose, Bld: 100 mg/dL — ABNORMAL HIGH (ref 70–99)
Potassium: 3.6 mEq/L (ref 3.5–5.1)
Sodium: 135 mEq/L (ref 135–145)
Total Bilirubin: 0.3 mg/dL (ref 0.2–1.2)
Total Protein: 7.5 g/dL (ref 6.0–8.3)

## 2021-03-05 LAB — CBC WITH DIFFERENTIAL/PLATELET
Basophils Absolute: 0 10*3/uL (ref 0.0–0.1)
Basophils Relative: 0.5 % (ref 0.0–3.0)
Eosinophils Absolute: 0.1 10*3/uL (ref 0.0–0.7)
Eosinophils Relative: 1.8 % (ref 0.0–5.0)
HCT: 39.4 % (ref 36.0–46.0)
Hemoglobin: 13.4 g/dL (ref 12.0–15.0)
Lymphocytes Relative: 29.6 % (ref 12.0–46.0)
Lymphs Abs: 1.8 10*3/uL (ref 0.7–4.0)
MCHC: 34.1 g/dL (ref 30.0–36.0)
MCV: 89.4 fl (ref 78.0–100.0)
Monocytes Absolute: 0.4 10*3/uL (ref 0.1–1.0)
Monocytes Relative: 5.8 % (ref 3.0–12.0)
Neutro Abs: 3.8 10*3/uL (ref 1.4–7.7)
Neutrophils Relative %: 62.3 % (ref 43.0–77.0)
Platelets: 312 10*3/uL (ref 150.0–400.0)
RBC: 4.41 Mil/uL (ref 3.87–5.11)
RDW: 12.8 % (ref 11.5–15.5)
WBC: 6.2 10*3/uL (ref 4.0–10.5)

## 2021-03-05 LAB — C-REACTIVE PROTEIN: CRP: <1 mg/dL (ref 0.5–20.0)

## 2021-03-05 MED ORDER — DICYCLOMINE HCL 10 MG PO CAPS: 10.0000 mg | ORAL_CAPSULE | Freq: Two times a day (BID) | ORAL | 0 refills | Status: DC | PRN

## 2021-03-05 NOTE — Patient Instructions (Addendum)
The [place] [facility] providers would like to encourage you to use [facility] to communicate with providers for non-urgent requests or questions.  Due to long hold times on the telephone, sending your provider a message by [facility] may be faster and more efficient way to get a response. Please allow 48 business hours for a response.  Please remember that this is for non-urgent requests/questions.  LABS:  Lab work has been ordered for you today. Our lab is located in the basement. Press "B" on the elevator. The lab is located at the first door on the left as you exit the elevator.  HEALTHCARE LAWS AND MY CHART RESULTS: Due to recent changes in healthcare laws, you may see the results of your imaging and laboratory studies on MyChart before your provider has had a chance to review them.   We understand that in some cases there may be results that are confusing or concerning to you. Not all laboratory results come back in the same time frame and the provider may be waiting for multiple results in order to interpret others.  Please give [place] 48 hours in order for your provider to thoroughly review all the results before contacting the office for clarification of your results.   IMAGING: You will be contacted by [facility] Scheduling (Your caller ID will indicate phone # [phone]) in the next 2 days to schedule your Abdominal CT Scan. If you have not heard from them within 2 business days, please call [place] at [phone] to follow up on the status of your appointment.    MEDICATION: We have sent the following medication to your pharmacy for you to pick up at your convenience: Dicyclomine 10 MG tablet. Take 1 tablet twice a day as needed for abdominal pain.  OVER THE COUNTER MEDICATION Please purchase the following medications over the counter and take as directed:  IBGARD 1 capsule 3 times a day. We have given you some samples today. Any Probiotic of your choice, take  daily.  It was great seeing you today! Thank you for entrusting me with your care and choosing [facility].  Noralyn Pick, CRNP  If you are age 3 or younger, your body mass index should be between 19-25. Your Body mass index is 27.96 kg/m. If this is out of the aformentioned range listed, please consider follow up with your Primary Care Provider.

## 2021-03-05 NOTE — Progress Notes (Signed)
.    03/05/2021 Candace Shaw [id number] March 19, 1996   Chief Complaint: Nausea, lower abdominal pain, bloody diarrhea  History of Present Illness: Candace Shaw is a 25 year old female with a past medical history of asthma, depression, bipolar disorder, alcohol use disorder (abstinent for several years) and chronic lower abdominal pain and diarrhea.   She was initially seen in our office by after Ardis Hughs 07/11/2020 for further evaluation regarding chronic abdominal bloat, chronic diarrhea, flushing, urticaria and dyspepsia. She underwent an EGD 07/22/2020 which showed mild gastritis and peptic duodenitis.  No  evidence of H. Pylori or celiac disease. A colonoscopy was done on the same date which identified a one 35m sessile serrated polyp removed from the ascending colon. Colon biopsies were negative for colitis/IBD.   2021 labs: SPEP, plasma metanephrines, alpha gal panel, VMA and catecholamines, urinary 5-HIAA, urinary metanephrines, ANA, sed rate, CBC, complete metabolic profile all were normal. 2020 celiac testing with TTG was completely normal, total IgA was completely normal  CTAP 02/18/2015 showed A 2 cm dominant right ovarian follicle with possible rupture.  No evidence of bowel obstruction or inflammation.  Normal appendix  She presents to our office today complaining of the same symptoms. She continues to have episodes of diarrhea, intermittently bloody diarrhea with lower abdominal pain and generalized abdominal bloat. She passes 5 to 10 watery to mud like diarrhea episodes. She sometimes sees a moderate amount of darker red, almost maroon blood mixed in the diarrhea. Her diarrhea is worse in the morning. She typically awakens around 4 - 5am and has an episode of have diarrhea. No solid stools for the past month, previously passed a solid stool once or twice weekly and diarrhea 5 days weekly. No recent antibiotics. Sometimes sweats and feels flushed. She has intermittent  nausea, infrequently vomits nonbloody emesis. No specific food triggers. She had a sour stomach, epigastric pain one week ago which was different from her past epigastric pain. She has intermittent flushing with eruptions of erythematous urticaria patches on her face, arms, legs and back. She has seen a rheumatologist with a negative lupus work up. [facility] biopsy which she reported showed mild superficial perivascular lymphocytic infiltrate. Her dermatologist thought she might have Stills disease, a rare form of inflammatory arthritis. She was prescribed Rubinol to control her sweating. She has seen an allergist, evaluation was unrevealing. She tried antihistamines, gabapentin and Amitriptyline without improvement. She reports having a temp of 100 - 100.1 most days which is also chronic. She is scheduled to see an infectious disease specialist.  She has lost 5 to 6 lbs over the past 6 months.   She takes Aleve Q HS.  Not on BCPs or other hormones.   Past history of alcohol use disorder, no alcohol for a few years.   She is adopted but she is in contact with her biological family. Her paternal grandfather died from colon cancer. Biological brother with eosinophilic asthma. Biological mother with rheumatoid arthritis and sister with lupus.   [facility] [place]   EGD 07/22/2020: Mild gastritis, biopsied to check for H. pylori. - Normal duodenum, biopsied to check for Celiac Sprue. - The examination was otherwise normal.  Colonoscopy 07/22/2020: - The examined portion of the ileum was normal. - One 11 mm polyp in the ascending colon, removed with a cold snare. Resected  and retrieved. - The examination was otherwise normal on direct and retroflexion views. - Biopsies were taken randomly with a cold forceps from the entire colon for evaluation of microscopic  colitis. -Recall colonoscopy 5 years Biopsy Results: 1. Surgical [P], colon, ascending, polyp (1) - MULTIPLE FRAGMENTS OF SESSILE SERRATED POLYP. - NO DYSPLASIA OR MALIGNANCY. 2. Surgical [P], colon, random sites - BENIGN COLONIC MUCOSA. - NO ACTIVE INFLAMMATION OR EVIDENCE OF MICROSCOPIC COLITIS. - NO DYSPLASIA OR MALIGNANCY. 3. Surgical [P], duodenum bx - PEPTIC DUODENITIS. - NO DYSPLASIA OR MALIGNANCY. 4. Surgical [P], gastric antrum, gastric body - MILD REACTIVE GASTROPATHY. [place] IS NEGATIVE FOR HELICOBACTER PYLORI. - NO INTESTINAL METAPLASIA, DYSPLASIA, OR MALIGNANCY   Current Outpatient Medications on File Prior to Visit  Medication Sig Dispense Refill   albuterol (VENTOLIN HFA) 108 (90 Base) MCG/ACT inhaler Inhale 1-2 puffs into the lungs every 6 (six) hours as needed for wheezing or shortness of breath (pre exercise). 18 g 1   Amphetamine ER (ADZENYS XR-ODT) 12.5 MG TBED Adzenys XR-ODT 12.5 mg extended release disintegrating tablet  Take 1 tablet every day by oral route.     diphenhydramine-acetaminophen (TYLENOL PM) 25-500 MG TABS tablet Take 1 tablet by mouth at bedtime as needed.     hydrOXYzine (VISTARIL) 25 MG capsule Take 25-50 mg by mouth daily as needed.     No current facility-administered medications on file prior to visit.   Allergies  Allergen Reactions   Ambien [Zolpidem Tartrate]     Hallucinations    Current Medications, Allergies, Past Medical History, Past Surgical History, Family History and Social History were reviewed in [employer] record.  Review of Systems:   Constitutional: See HPI.  Respiratory: Negative for shortness of breath.   Cardiovascular: Negative for chest pain, palpitations and leg swelling.  Gastrointestinal: See HPI.  Musculoskeletal: + multiple joint pains.  Neurological: Negative for dizziness, headaches or paresthesias.   Physical Exam: BP 102/60   Pulse 100   Ht '5\' 1"'$  (1.549 m)   Wt  148 lb (67.1 kg)   SpO2 98%   BMI 27.96 kg/m  Wt Readings from Last 3 Encounters:  03/05/21 148 lb (67.1 kg)  07/29/20 144 lb 6.4 oz (65.5 kg)  07/22/20 141 lb (64 kg)    General: 25 year old female in no acute distress. Head: Normocephalic and atraumatic. Eyes: No scleral icterus. Conjunctiva pink . Ears: Normal auditory acuity. Mouth: Dentition intact. Two small aphthous ulcers to central upper oral mucosa.   Lungs: Clear throughout to auscultation. Heart: Regular rate and rhythm, no murmur. Abdomen: Soft, nondistended. Mild tenderness right mid abdomen and throughout the lower abdomen without rebound or guarding. No masses or hepatomegaly. Normal bowel sounds x 4 quadrants. No bruit.  Rectal: Deferred.  Musculoskeletal: Symmetrical with no gross deformities. Extremities: No edema. Neurological: Alert oriented x 4. No focal deficits.  Psychological: Alert and cooperative. Normal mood and affect  Assessment and Recommendations:  65. 25 year old female with chronic, recurrent episodes of nausea, sweats, flushing, urticaria, fever, lower abdominal pain and bloody diarrhea of unclear etiology. EGD/Colonoscopy 07/2020 without evidence celiac disease, IBD, carcinoid syndrome or GI malignancy. Current differential includes abdominal angioedema, vasculitis, ? Still's disease -C4, C 1 esterase inhibitor level, CRP, CBC and CMP -CTAP with oral and IV contrast. LMP was 3 1/2 weeks ago, patient denies chance of pregnancy, declines beta HCG testing prior to CT.  -If CTAP negative, consider small bowel capsule endoscopy +/- repeat colonoscopy  -Stool  fecal calprotectin level. GI pathogen panel (rule out infectious component as her chronic diarrhea has recently increased) -Continue follow up with dermatology, rheumatology  -Await recommendations from ID -Consider Familial Mediterranean fever (FMF) evaluation -Dicyclomine '10mg'$  one tab po bid PRN abdominal pain -Ibgard 1 po tid -Probiotic of  choice once daily   2. History of a sessile serrated colon polyp removed from the ascending colon at the time of a colonoscopy 07/2020. Biological paternal grandfather with history of colon cancer.  -Next colonoscopy due 07/2025

## 2021-03-06 ENCOUNTER — Other Ambulatory Visit: Payer: Self-pay

## 2021-03-06 ENCOUNTER — Encounter: Payer: Self-pay | Admitting: Internal Medicine

## 2021-03-06 ENCOUNTER — Ambulatory Visit: Payer: BC Managed Care – PPO | Admitting: Internal Medicine

## 2021-03-06 DIAGNOSIS — M2550 Pain in unspecified joint: Secondary | ICD-10-CM | POA: Diagnosis not present

## 2021-03-06 LAB — C4 COMPLEMENT: C4 Complement: 40 mg/dL (ref 15–57)

## 2021-03-06 LAB — C1 ESTERASE INHIBITOR: C1INH SerPl-mCnc: 24 mg/dL (ref 21–39)

## 2021-03-06 NOTE — Progress Notes (Signed)
[facility] for Infectious Disease      Reason for Consult:fever    Referring Physician: Dr. Regis Bill    Patient ID: Candace Shaw, female    DOB: 04/10/1996, 25 y.o.   MRN: [id number]  HPI:   Here for evaluation of fever.  She has a history of chronic diarrhea, and has been having issues with fever, night sweats, rash and joint swelling and arthralgias for over 1 year.  She has had episodic rash associated with diffuse, mainly small joint arthralgias with some joint swelling, bilateral feet and hand swelling with confluent erythema and warmth.  She also has a history of chronic diarrhea and underwent previous colonoscopy and found polyps.  She was adopted but knows the birth family now and some rheumatic diseases known in the family.  She has a history of mono-like illness and a snake bite with inguinal lymphadenopathy following the bite.  She has never tried steroids.  Alleve is one medication that has relieved active symptoms to some degree.  She is vegan but has had no issues with anemia.  Has tried B12 shots, amitriptyline, black kohosh but all after symptoms had started.  No significant weight loss. She had a biopsy of the rash and c/w mild superficial perivascular lymphocytic infiltrate.    Past Medical History:  Diagnosis Date   Alcoholism ([place])    Anemia    Asthma    Bipolar disorder ([place])    Depression    Hallucinogen dependence, unspecified 10/03/2012   Headache(784.0)    Obesity    Personality disorder ([place])    Substance abuse ([place])    hallucengins & alcohol - in remission   Urticaria     Prior to Admission medications   Medication Sig Start Date End Date Taking? Authorizing Provider  Amphetamine ER (ADZENYS XR-ODT) 12.5 MG TBED Adzenys XR-ODT 12.5 mg extended release disintegrating tablet  Take 1 tablet every day by oral route.   Yes [provider]  diphenhydramine-acetaminophen (TYLENOL PM) 25-500 MG TABS tablet Take 1 tablet by mouth at bedtime as  needed.   Yes [provider]  hydrOXYzine (VISTARIL) 25 MG capsule Take 25-50 mg by mouth daily as needed. 06/18/20  Yes [provider]  albuterol (VENTOLIN HFA) 108 (90 Base) MCG/ACT inhaler Inhale 1-2 puffs into the lungs every 6 (six) hours as needed for wheezing or shortness of breath (pre exercise). 12/13/19 03/05/21  Panosh, Standley Brooking, MD  dicyclomine (BENTYL) 10 MG capsule Take 1 capsule (10 mg total) by mouth 2 (two) times daily as needed for spasms (abdominal pain). Patient not taking: Reported on 03/06/2021 03/05/21   Noralyn Pick, NP    Allergies  Allergen Reactions   Ambien [Zolpidem Tartrate]     Hallucinations    Social History   Tobacco Use   Smoking status: Every Day    Types: E-cigarettes   Smokeless tobacco: Never  Vaping Use   Vaping Use: Every day   Substances: Nicotine  Substance Use Topics   Alcohol use: Not Currently    Comment: sober since 2014   Drug use: Not Currently    Types: Marijuana    Comment: alcoholism, hallucingen - dependancy - in remission    Family History  Adopted: Yes  Problem Relation Age of Onset   Alcohol abuse Mother    Alcohol abuse Father    Colon cancer Other     Review of Systems  Constitutional: positive for fevers, sweats, and fatigue or negative for weight  loss Respiratory: negative for cough, sputum, hemoptysis, or dyspnea on exertion Gastrointestinal: positive for diarrhea and bloating, negative for nausea and vomiting Genitourinary: negative for frequency and dysuria Integument/breast: negative for rash Hematologic/lymphatic: negative for lymphadenopathy Musculoskeletal: positive for myalgias and arthralgias Neurological: negative for headaches and dizziness All other systems reviewed and are negative    Constitutional: in no apparent distress  Vitals:   03/06/21 0954  BP: 114/74  Pulse: 91  Temp: 98.2 F (36.8 C)   EYES: anicteric ENMT: no thrush Cardiovascular: Cor RRR and No  murmurs Respiratory: clear GI: Bowel sounds are normal, liver is not enlarged, spleen is not enlarged Musculoskeletal: no pedal edema noted Skin: negatives: no rash Hematologic: no cervical lad  Labs: Lab Results  Component Value Date   WBC 6.2 03/05/2021   HGB 13.4 03/05/2021   HCT 39.4 03/05/2021   MCV 89.4 03/05/2021   PLT 312.0 03/05/2021    Lab Results  Component Value Date   CREATININE 0.64 03/05/2021   BUN 10 03/05/2021   NA 135 03/05/2021   K 3.6 03/05/2021   CL 103 03/05/2021   CO2 23 03/05/2021    Lab Results  Component Value Date   ALT 11 03/05/2021   AST 12 03/05/2021   ALKPHOS 45 03/05/2021   BILITOT 0.3 03/05/2021     Assessment: fever, arthralgias, rash over a prolonged period.  Differential is broad and mainly rheumatologic.  At this point, with prolonged symptoms, is not c/w an infectious process.   If she has a flare, can check ferritin, ESR, CRP, HIV and she will call to schedule a lab visit here.  Agree with referral to rheumatology.  We did send a referral to [facility] but first available is March 2023.  Will try locally.  I agree with consideration of Adult Still's disease.    Plan: 1)  labs during a flare 2) referral to rheumatology  Follow up as needed.

## 2021-03-09 ENCOUNTER — Other Ambulatory Visit: Payer: BC Managed Care – PPO

## 2021-03-09 DIAGNOSIS — R1030 Lower abdominal pain, unspecified: Secondary | ICD-10-CM

## 2021-03-09 DIAGNOSIS — R197 Diarrhea, unspecified: Secondary | ICD-10-CM

## 2021-03-09 DIAGNOSIS — R1010 Upper abdominal pain, unspecified: Secondary | ICD-10-CM

## 2021-03-09 DIAGNOSIS — R140 Abdominal distension (gaseous): Secondary | ICD-10-CM | POA: Diagnosis not present

## 2021-03-09 DIAGNOSIS — F603 Borderline personality disorder: Secondary | ICD-10-CM | POA: Diagnosis not present

## 2021-03-12 LAB — GI PROFILE, STOOL, PCR

## 2021-03-12 LAB — CALPROTECTIN, FECAL: Calprotectin, Fecal: <16 ug/g (ref 0–120)

## 2021-03-13 ENCOUNTER — Telehealth: Payer: Self-pay | Admitting: Nurse Practitioner

## 2021-03-13 DIAGNOSIS — F902 Attention-deficit hyperactivity disorder, combined type: Secondary | ICD-10-CM | POA: Diagnosis not present

## 2021-03-13 DIAGNOSIS — F3132 Bipolar disorder, current episode depressed, moderate: Secondary | ICD-10-CM | POA: Diagnosis not present

## 2021-03-13 DIAGNOSIS — F509 Eating disorder, unspecified: Secondary | ICD-10-CM | POA: Diagnosis not present

## 2021-03-13 DIAGNOSIS — F603 Borderline personality disorder: Secondary | ICD-10-CM | POA: Diagnosis not present

## 2021-03-13 NOTE — Telephone Encounter (Signed)
FYI  Spoke with patient. Per patient she is moving Wednesday to [place]. Would prefer to stick with a specialist in [place]. She does not know who as of yet but states she will call office and let us know so referral information can be sent if this is okay. Patient states she prefers to have CT scan in [place] as well, as she is set up for this Thursday. I asked her to let us know as soon as possible what practice she is going to. As far as drug interaction she stated the pharmacist did not know, so advised her of rec's from our office to stop taking and she verbalized understanding with no hesitancy. At this point it appears she is switching practices and we are waiting if that is okay to send her information from our office.  Patient had no further concerns at this time.

## 2021-03-13 NOTE — Telephone Encounter (Signed)
Candace Shaw, patient sent a mychart msg which was forwarded to me 03/12/2021, note I was off work 8/4 and today but I reviewed her msg at this time and did not want to delay a response.   My chart msg 8/4/202 as follows: Hello. I read that all my results have been normal so far. I was wondering if you still plan on scheduling a CT scan? Also, I'm not sure what to do from here if my results are normal. My issues have persisted for a long time, and IBGard does not help at all. I picked up my prescription from the pharmacy, but the pharmacist was concerned what I was prescribed due to an interaction, and she suggested I reach out to you prior to taking the new medication. If the CT scan is still something you'd suggest, I would need to have it completed by Tuesday of next week, because I am moving to [place] on Wednesday, and will not be backin [place] for several weeks. Thank you for your help.    Refer to office visit 03/05/2021(my first time seeing her),  patient has a complex history seen by several specialists at [facility] and [place] including prior GI eval by Dr. Ardis Hughs, rheumatology, dermatology and ID.   Pls see if you can expedite her CTAP which was ordered at the time of her office visit.  Pls have the patient clarify what potential reaction to Dicyclomine is she referring too? She does not need to take it. Since her GI pathogen panel and fecal calprotectin level were normal, EGD/colonoscopy 07/2020 without celiac dz/IBD I would empirically treat her for SIBO (small intestina bacteria overgrowth) with Xifaxan '550mg'$  one po tid x 14 days # 42, no refills and start Florastor probiotic one po bid x 4 to 6 weeks with the intent to reduce her chronic abdominal bloat, abdominal pain and diarrhea.  Let her know Dr. Ardis Hughs has been out of the office since her office visit, await his recommendations upon his return next week.  If she is going to be in [place], I would recommend she see a GI at [facility].  Lets see what Dr. Ardis Hughs recommends.

## 2021-03-16 NOTE — Progress Notes (Signed)
I agree with the above note, plan 

## 2021-03-25 DIAGNOSIS — F603 Borderline personality disorder: Secondary | ICD-10-CM | POA: Diagnosis not present

## 2021-04-21 DIAGNOSIS — Z888 Allergy status to other drugs, medicaments and biological substances status: Secondary | ICD-10-CM | POA: Diagnosis not present

## 2021-04-21 DIAGNOSIS — R232 Flushing: Secondary | ICD-10-CM | POA: Diagnosis not present

## 2021-04-21 DIAGNOSIS — R21 Rash and other nonspecific skin eruption: Secondary | ICD-10-CM | POA: Diagnosis not present

## 2021-04-21 DIAGNOSIS — M2550 Pain in unspecified joint: Secondary | ICD-10-CM | POA: Diagnosis not present

## 2021-04-21 DIAGNOSIS — R509 Fever, unspecified: Secondary | ICD-10-CM | POA: Diagnosis not present

## 2021-04-21 DIAGNOSIS — M061 Adult-onset Still's disease: Secondary | ICD-10-CM | POA: Diagnosis not present

## 2021-04-22 DIAGNOSIS — F603 Borderline personality disorder: Secondary | ICD-10-CM | POA: Diagnosis not present

## 2021-04-29 DIAGNOSIS — F603 Borderline personality disorder: Secondary | ICD-10-CM | POA: Diagnosis not present

## 2021-05-13 DIAGNOSIS — F603 Borderline personality disorder: Secondary | ICD-10-CM | POA: Diagnosis not present

## 2021-05-20 DIAGNOSIS — F603 Borderline personality disorder: Secondary | ICD-10-CM | POA: Diagnosis not present

## 2021-05-22 DIAGNOSIS — F902 Attention-deficit hyperactivity disorder, combined type: Secondary | ICD-10-CM | POA: Diagnosis not present

## 2021-05-22 DIAGNOSIS — F4312 Post-traumatic stress disorder, chronic: Secondary | ICD-10-CM | POA: Diagnosis not present

## 2021-05-22 DIAGNOSIS — F603 Borderline personality disorder: Secondary | ICD-10-CM | POA: Diagnosis not present

## 2021-05-22 DIAGNOSIS — F3132 Bipolar disorder, current episode depressed, moderate: Secondary | ICD-10-CM | POA: Diagnosis not present

## 2021-05-27 DIAGNOSIS — F603 Borderline personality disorder: Secondary | ICD-10-CM | POA: Diagnosis not present

## 2021-05-29 DIAGNOSIS — F909 Attention-deficit hyperactivity disorder, unspecified type: Secondary | ICD-10-CM | POA: Diagnosis not present

## 2021-06-03 DIAGNOSIS — F603 Borderline personality disorder: Secondary | ICD-10-CM | POA: Diagnosis not present

## 2021-06-10 DIAGNOSIS — F603 Borderline personality disorder: Secondary | ICD-10-CM | POA: Diagnosis not present

## 2021-06-16 DIAGNOSIS — F909 Attention-deficit hyperactivity disorder, unspecified type: Secondary | ICD-10-CM | POA: Diagnosis not present

## 2021-06-16 DIAGNOSIS — F349 Persistent mood [affective] disorder, unspecified: Secondary | ICD-10-CM | POA: Diagnosis not present

## 2021-06-17 DIAGNOSIS — F603 Borderline personality disorder: Secondary | ICD-10-CM | POA: Diagnosis not present

## 2021-06-24 DIAGNOSIS — F603 Borderline personality disorder: Secondary | ICD-10-CM | POA: Diagnosis not present

## 2021-06-25 DIAGNOSIS — Z23 Encounter for immunization: Secondary | ICD-10-CM | POA: Diagnosis not present

## 2021-06-30 DIAGNOSIS — F909 Attention-deficit hyperactivity disorder, unspecified type: Secondary | ICD-10-CM | POA: Diagnosis not present

## 2021-06-30 DIAGNOSIS — F349 Persistent mood [affective] disorder, unspecified: Secondary | ICD-10-CM | POA: Diagnosis not present

## 2021-07-08 DIAGNOSIS — F603 Borderline personality disorder: Secondary | ICD-10-CM | POA: Diagnosis not present

## 2021-07-13 DIAGNOSIS — F349 Persistent mood [affective] disorder, unspecified: Secondary | ICD-10-CM | POA: Diagnosis not present

## 2021-07-13 DIAGNOSIS — F909 Attention-deficit hyperactivity disorder, unspecified type: Secondary | ICD-10-CM | POA: Diagnosis not present

## 2021-07-15 DIAGNOSIS — F603 Borderline personality disorder: Secondary | ICD-10-CM | POA: Diagnosis not present

## 2021-07-21 DIAGNOSIS — R232 Flushing: Secondary | ICD-10-CM | POA: Diagnosis not present

## 2021-07-21 DIAGNOSIS — M2550 Pain in unspecified joint: Secondary | ICD-10-CM | POA: Diagnosis not present

## 2021-07-21 DIAGNOSIS — R21 Rash and other nonspecific skin eruption: Secondary | ICD-10-CM | POA: Diagnosis not present

## 2021-07-21 DIAGNOSIS — R509 Fever, unspecified: Secondary | ICD-10-CM | POA: Diagnosis not present

## 2021-07-22 DIAGNOSIS — F603 Borderline personality disorder: Secondary | ICD-10-CM | POA: Diagnosis not present

## 2021-07-29 DIAGNOSIS — F603 Borderline personality disorder: Secondary | ICD-10-CM | POA: Diagnosis not present

## 2021-08-05 DIAGNOSIS — F603 Borderline personality disorder: Secondary | ICD-10-CM | POA: Diagnosis not present

## 2021-08-13 ENCOUNTER — Telehealth (INDEPENDENT_AMBULATORY_CARE_PROVIDER_SITE_OTHER): Payer: BC Managed Care – PPO | Admitting: Internal Medicine

## 2021-08-13 ENCOUNTER — Other Ambulatory Visit: Payer: Self-pay

## 2021-08-13 VITALS — Temp 99.1°F | Wt 139.0 lb

## 2021-08-13 DIAGNOSIS — F1911 Other psychoactive substance abuse, in remission: Secondary | ICD-10-CM

## 2021-08-13 DIAGNOSIS — Z79899 Other long term (current) drug therapy: Secondary | ICD-10-CM | POA: Diagnosis not present

## 2021-08-13 DIAGNOSIS — F909 Attention-deficit hyperactivity disorder, unspecified type: Secondary | ICD-10-CM | POA: Diagnosis not present

## 2021-08-13 MED ORDER — LISDEXAMFETAMINE DIMESYLATE 20 MG PO CAPS: 20.0000 mg | ORAL_CAPSULE | Freq: Every day | ORAL | 0 refills | Status: DC

## 2021-08-13 NOTE — Progress Notes (Signed)
Virtual Visit via Video Note  I connected with [name] on 08/14/21 at  9:30 AM EST by a video enabled telemedicine application and verified that I am speaking with the correct person using two identifiers. Location patient: home Location provider: home office Persons participating in the virtual visit: patient, provider  WIth national recommendations  regarding COVID 19 pandemic   video visit is advised over in office visit for this patient.  Patient aware  of the limitations of evaluation and management by telemedicine and  availability of in person appointments. and agreed to proceed.   HPI: ANIKAH HOGGE presents for video visit has been on medications for ADHD for a well diagnosed [place] attention center and previous. Has been under prescribing psych care through [facility] and more recently's in the [facility] area.  However although Vyvanse apparently has worked the best for her without significant side effects the other providers had prescribed other medicine such as Focalin methylphenidate CD and ADZENYS xr ( amphetamine er).  These medicines cause flareup of suicidal ideation and symptoms.  However for some reason she is unable to explain perhaps prescribing but I would not continue to prescribe Vyvanse. The local psychiatry team in [facility] advised further evaluation for the ADHD neuropsych educ reevaluation which is backed up 3 to 6 months.  In addition would cost  a lot.   She is  looking for other psychiatry team to help envelop all of her meds  but many are  booked far out.   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 She sees therapist on a regular basis  who agrees ontinued medication. 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 FYI under eval autoimmune sx   poss variant of  fam med fever.  Past Medical History:  Diagnosis Date   Acute lymphadenitis of lower extremity 02/16/2018   Alcoholism ([place])    Anemia    Asthma    Bipolar disorder ([place])    Depression    Hallucinogen dependence, unspecified 10/03/2012   Headache(784.0)    Obesity    Personality disorder ([place])    Snake bite 02/16/2018   Substance abuse ([place])    hallucengins & alcohol - in remission   Urticaria     Past Surgical History:  Procedure Laterality Date   COLONOSCOPY     WISDOM TOOTH EXTRACTION  Age 3    Family History  Adopted: Yes  Problem Relation Age of Onset   Alcohol abuse Mother    Alcohol abuse Father    Colon cancer Other     Social History   Tobacco Use   Smoking status: Every Day    Types: E-cigarettes   Smokeless tobacco: Never  Vaping Use   Vaping Use: Every day   Substances: Nicotine  Substance Use Topics   Alcohol use: Not Currently    Comment: sober since 2014   Drug use: Not Currently    Types: Marijuana    Comment: alcoholism, hallucingen - dependancy - in remission      Current Outpatient Medications:    buPROPion (WELLBUTRIN SR) 150 MG 12 hr tablet, Take 150 mg by mouth 2 (two) times  daily., Disp: , Rfl:    lisdexamfetamine (VYVANSE) 20 MG capsule, Take 1 capsule (20 mg total) by mouth daily., Disp: 30 capsule, Rfl: 0   diphenhydramine-acetaminophen (TYLENOL PM) 25-500 MG TABS tablet, Take 1 tablet by mouth at bedtime as needed., Disp: , Rfl:   EXAM: BP Readings from Last 3 Encounters:  03/06/21 114/74  03/05/21 102/60  10/09/20 110/68    VITALS per patient if applicable:  GENERAL: alert, oriented, appears well and in no acute distress  HEENT: atraumatic, conjunttiva clear, no obvious abnormalities on inspection of external nose and ears  NECK: normal movements of the head and neck  LUNGS: on inspection no signs of respiratory distress, breathing rate  appears normal, no obvious gross SOB, gasping or wheezing  CV: no obvious cyanosis  MS: moves all visible extremities without noticeable abnormality  PSYCH/NEURO: pleasant and cooperative, no obvious depression or anxiety, speech and thought processing grossly intact Lab Results  Component Value Date   WBC 6.2 03/05/2021   HGB 13.4 03/05/2021   HCT 39.4 03/05/2021   PLT 312.0 03/05/2021   GLUCOSE 100 (H) 03/05/2021   CHOL 176 07/09/2019   TRIG 75.0 07/09/2019   HDL 52.90 07/09/2019   LDLCALC 108 (H) 07/09/2019   ALT 11 03/05/2021   AST 12 03/05/2021   NA 135 03/05/2021   K 3.6 03/05/2021   CL 103 03/05/2021   CREATININE 0.64 03/05/2021   BUN 10 03/05/2021   CO2 23 03/05/2021   TSH 1.79 07/09/2019   HGBA1C 5.2 07/09/2019    ASSESSMENT AND PLAN:  Discussed the following assessment and plan:    ICD-10-CM   1. Attention deficit hyperactivity disorder (ADHD), unspecified ADHD type  F90.9     2. Medication management  Z79.899     3. Substance abuse in remission [facility])  F19.11      Diagnosis ADD /ADHD previously at [place] attention [facility] acknowledged by psychiatry [facility] and now she is in [facility] area.  Her depression appears to be adequately treated by Wellbutrin and therapy in the middle of transition and difficulty  with the ability to get providers ,and the current psychiatry practice is asking for another full evaluation that may cost a good deal of money . There is at least a 6 mos wait for practices and other evaluation. Reports  Vyvanse has been the best medicine for her sx and is been given 3  other different stimulant meds  that have negatively  affected her mood and triggered suicidality thoughts.   She will continue in therapy, get most recent evaluation of records sent to [place] as well as a therapy letter work contact. She will continue to work on getting into  a comprehensive psychiatry practice who can help prescribe all of her medicines. In the  meantime 30 days of the Vyvanse 20 mg to be sent in to her local pharmacy. I dont expect to do continued  rx for  controlled substance but  at this time believer help more benefit than  harm of  continuing vyvanse  Counseled.   Expectant management and discussion of plan and treatment with opportunity to ask questions and all were answered. The patient agreed with the plan and demonstrated an understanding of the instructions.  35 minutes  visit disc counsel and plan  Advised to call back or seek an in-person evaluation if worsening  or having  further concerns  in interim. Return for as indicated months about meds as indicated .   Mariann Laster  Regis Bill, MD

## 2021-08-14 ENCOUNTER — Encounter: Payer: Self-pay | Admitting: Internal Medicine

## 2021-08-25 DIAGNOSIS — F603 Borderline personality disorder: Secondary | ICD-10-CM | POA: Diagnosis not present

## 2021-08-28 ENCOUNTER — Encounter: Payer: Self-pay | Admitting: Internal Medicine

## 2021-08-31 ENCOUNTER — Encounter: Payer: Self-pay | Admitting: Internal Medicine

## 2021-09-01 ENCOUNTER — Telehealth: Payer: Self-pay | Admitting: Internal Medicine

## 2021-09-01 NOTE — Telephone Encounter (Signed)
Pharmacy was called and they stated they are working on refilling Rx and that patient should get a text when ready for pick-up.

## 2021-09-01 NOTE — Telephone Encounter (Signed)
Patient called because she has went to pharmacy on Monday 01/23, and pharmacy states they do not have her lisdexamfetamine (VYVANSE) 20 MG capsule  in.    Please send prescription to  [place], [place] Phone:  [phone]  Fax:  [phone]         Please advise

## 2021-09-03 DIAGNOSIS — F603 Borderline personality disorder: Secondary | ICD-10-CM | POA: Diagnosis not present

## 2021-09-14 DIAGNOSIS — F603 Borderline personality disorder: Secondary | ICD-10-CM | POA: Diagnosis not present

## 2021-09-22 DIAGNOSIS — Z03818 Encounter for observation for suspected exposure to other biological agents ruled out: Secondary | ICD-10-CM | POA: Diagnosis not present

## 2021-09-22 DIAGNOSIS — Z20822 Contact with and (suspected) exposure to covid-19: Secondary | ICD-10-CM | POA: Diagnosis not present

## 2021-09-22 DIAGNOSIS — J029 Acute pharyngitis, unspecified: Secondary | ICD-10-CM | POA: Diagnosis not present

## 2021-09-22 DIAGNOSIS — H66002 Acute suppurative otitis media without spontaneous rupture of ear drum, left ear: Secondary | ICD-10-CM | POA: Diagnosis not present

## 2021-09-24 DIAGNOSIS — F603 Borderline personality disorder: Secondary | ICD-10-CM | POA: Diagnosis not present

## 2021-10-01 ENCOUNTER — Other Ambulatory Visit: Payer: Self-pay | Admitting: Internal Medicine

## 2021-10-01 DIAGNOSIS — F603 Borderline personality disorder: Secondary | ICD-10-CM | POA: Diagnosis not present

## 2021-10-05 DIAGNOSIS — F349 Persistent mood [affective] disorder, unspecified: Secondary | ICD-10-CM | POA: Diagnosis not present

## 2021-10-05 MED ORDER — LISDEXAMFETAMINE DIMESYLATE 20 MG PO CAPS: 20.0000 mg | ORAL_CAPSULE | Freq: Every day | ORAL | 0 refills | Status: DC

## 2021-10-05 NOTE — Telephone Encounter (Signed)
will refill again x1 as bridge therapy please give [place] an update about finding a managing permanent prescriber.

## 2021-10-18 NOTE — Telephone Encounter (Signed)
Refill was send in feb 27  records  presented were reviewed  and sent to scan ?

## 2021-10-19 DIAGNOSIS — M064 Inflammatory polyarthropathy: Secondary | ICD-10-CM | POA: Diagnosis not present

## 2021-10-19 DIAGNOSIS — R21 Rash and other nonspecific skin eruption: Secondary | ICD-10-CM | POA: Diagnosis not present

## 2021-10-19 DIAGNOSIS — E850 Non-neuropathic heredofamilial amyloidosis: Secondary | ICD-10-CM | POA: Diagnosis not present

## 2021-10-19 DIAGNOSIS — R232 Flushing: Secondary | ICD-10-CM | POA: Diagnosis not present

## 2021-10-19 DIAGNOSIS — K529 Noninfective gastroenteritis and colitis, unspecified: Secondary | ICD-10-CM | POA: Diagnosis not present

## 2021-10-19 DIAGNOSIS — M049 Autoinflammatory syndrome, unspecified: Secondary | ICD-10-CM | POA: Diagnosis not present

## 2021-10-19 DIAGNOSIS — R197 Diarrhea, unspecified: Secondary | ICD-10-CM | POA: Diagnosis not present

## 2021-10-20 DIAGNOSIS — M041 Periodic fever syndromes: Secondary | ICD-10-CM | POA: Diagnosis not present

## 2021-10-20 DIAGNOSIS — M064 Inflammatory polyarthropathy: Secondary | ICD-10-CM | POA: Diagnosis not present

## 2021-10-22 DIAGNOSIS — E850 Non-neuropathic heredofamilial amyloidosis: Secondary | ICD-10-CM | POA: Diagnosis not present

## 2021-10-22 DIAGNOSIS — R232 Flushing: Secondary | ICD-10-CM | POA: Diagnosis not present

## 2021-10-22 DIAGNOSIS — L709 Acne, unspecified: Secondary | ICD-10-CM | POA: Diagnosis not present

## 2021-10-22 DIAGNOSIS — R197 Diarrhea, unspecified: Secondary | ICD-10-CM | POA: Diagnosis not present

## 2021-10-22 DIAGNOSIS — R21 Rash and other nonspecific skin eruption: Secondary | ICD-10-CM | POA: Diagnosis not present

## 2021-10-22 DIAGNOSIS — K529 Noninfective gastroenteritis and colitis, unspecified: Secondary | ICD-10-CM | POA: Diagnosis not present

## 2021-10-22 DIAGNOSIS — M049 Autoinflammatory syndrome, unspecified: Secondary | ICD-10-CM | POA: Diagnosis not present

## 2021-10-23 DIAGNOSIS — M049 Autoinflammatory syndrome, unspecified: Secondary | ICD-10-CM | POA: Diagnosis not present

## 2021-10-23 DIAGNOSIS — R21 Rash and other nonspecific skin eruption: Secondary | ICD-10-CM | POA: Diagnosis not present

## 2021-10-23 DIAGNOSIS — K529 Noninfective gastroenteritis and colitis, unspecified: Secondary | ICD-10-CM | POA: Diagnosis not present

## 2021-10-23 DIAGNOSIS — R197 Diarrhea, unspecified: Secondary | ICD-10-CM | POA: Diagnosis not present

## 2021-10-23 DIAGNOSIS — E850 Non-neuropathic heredofamilial amyloidosis: Secondary | ICD-10-CM | POA: Diagnosis not present

## 2021-10-23 DIAGNOSIS — F17200 Nicotine dependence, unspecified, uncomplicated: Secondary | ICD-10-CM | POA: Diagnosis not present

## 2021-10-29 DIAGNOSIS — F603 Borderline personality disorder: Secondary | ICD-10-CM | POA: Diagnosis not present

## 2021-11-23 DIAGNOSIS — F603 Borderline personality disorder: Secondary | ICD-10-CM | POA: Diagnosis not present

## 2021-11-24 DIAGNOSIS — M048 Other autoinflammatory syndromes: Secondary | ICD-10-CM | POA: Diagnosis not present

## 2021-11-24 DIAGNOSIS — R21 Rash and other nonspecific skin eruption: Secondary | ICD-10-CM | POA: Diagnosis not present

## 2021-11-24 DIAGNOSIS — R232 Flushing: Secondary | ICD-10-CM | POA: Diagnosis not present

## 2021-11-24 DIAGNOSIS — M2550 Pain in unspecified joint: Secondary | ICD-10-CM | POA: Diagnosis not present

## 2021-11-30 DIAGNOSIS — F603 Borderline personality disorder: Secondary | ICD-10-CM | POA: Diagnosis not present

## 2021-12-01 DIAGNOSIS — K529 Noninfective gastroenteritis and colitis, unspecified: Secondary | ICD-10-CM | POA: Diagnosis not present

## 2021-12-01 DIAGNOSIS — R21 Rash and other nonspecific skin eruption: Secondary | ICD-10-CM | POA: Diagnosis not present

## 2021-12-01 DIAGNOSIS — M049 Autoinflammatory syndrome, unspecified: Secondary | ICD-10-CM | POA: Diagnosis not present

## 2021-12-21 ENCOUNTER — Encounter: Payer: Self-pay | Admitting: Internal Medicine

## 2021-12-22 NOTE — Telephone Encounter (Signed)
Thank you for the updated the details  I can refill the Vyvanse 20 mg for 30 days. ?Please advise which pharmacy to use . ?Please  share evaluation with me when completed .

## 2021-12-23 MED ORDER — LISDEXAMFETAMINE DIMESYLATE 20 MG PO CAPS: 20.0000 mg | ORAL_CAPSULE | Freq: Every day | ORAL | 0 refills | Status: DC

## 2022-01-12 DIAGNOSIS — F603 Borderline personality disorder: Secondary | ICD-10-CM | POA: Diagnosis not present

## 2022-01-19 DIAGNOSIS — F902 Attention-deficit hyperactivity disorder, combined type: Secondary | ICD-10-CM | POA: Diagnosis not present

## 2022-01-20 DIAGNOSIS — F603 Borderline personality disorder: Secondary | ICD-10-CM | POA: Diagnosis not present

## 2022-01-26 DIAGNOSIS — M042 Cryopyrin-associated periodic syndromes: Secondary | ICD-10-CM | POA: Diagnosis not present

## 2022-01-28 DIAGNOSIS — F603 Borderline personality disorder: Secondary | ICD-10-CM | POA: Diagnosis not present

## 2022-02-03 ENCOUNTER — Other Ambulatory Visit: Payer: Self-pay | Admitting: Internal Medicine

## 2022-02-03 MED ORDER — LISDEXAMFETAMINE DIMESYLATE 20 MG PO CAPS: 20.0000 mg | ORAL_CAPSULE | Freq: Every day | ORAL | 0 refills | Status: AC

## 2022-02-03 NOTE — Telephone Encounter (Signed)
Last Vv 08/13/21 Filled 5/17/2 Is it ok to refill?

## 2022-02-03 NOTE — Telephone Encounter (Signed)
Keep [place] updated  I sent in the refill of Vyvanse

## 2022-02-10 DIAGNOSIS — F603 Borderline personality disorder: Secondary | ICD-10-CM | POA: Diagnosis not present

## 2022-02-12 ENCOUNTER — Telehealth: Payer: Self-pay | Admitting: Gastroenterology

## 2022-02-12 ENCOUNTER — Ambulatory Visit (HOSPITAL_COMMUNITY)
Admission: EM | Admit: 2022-02-12 | Discharge: 2022-02-12 | Disposition: A | Discharge status: Home / Self Care | Payer: BC Managed Care – PPO | Attending: Internal Medicine | Admitting: Internal Medicine

## 2022-02-12 ENCOUNTER — Encounter (HOSPITAL_COMMUNITY): Payer: Self-pay

## 2022-02-12 DIAGNOSIS — R197 Diarrhea, unspecified: Secondary | ICD-10-CM | POA: Diagnosis not present

## 2022-02-12 MED ORDER — METHYLPREDNISOLONE SODIUM SUCC 125 MG IJ SOLR
INTRAMUSCULAR | Status: AC
  Filled 2022-02-12: qty 2

## 2022-02-12 MED ORDER — ALBUTEROL SULFATE (2.5 MG/3ML) 0.083% IN NEBU
INHALATION_SOLUTION | RESPIRATORY_TRACT | Status: AC
Start: 2022-02-12 — End: ?
  Filled 2022-02-12: qty 3

## 2022-02-12 MED ORDER — ONDANSETRON 4 MG PO TBDP
4.0000 mg | ORAL_TABLET | Freq: Three times a day (TID) | ORAL | 0 refills | Status: AC | PRN
Start: 2022-02-12 — End: ?

## 2022-02-12 NOTE — Discharge Instructions (Signed)
Continue Pedialyte and drinking plenty of water to stay ahead of dehydration related to diarrhea.  Continue over-the-counter medications for diarrhea as needed.  Bring the stool sample back for testing.  Go to the emergency department if you feel that you are becoming more dehydrated, your abdominal pain becomes more severe, or if you develop any new or worsening symptoms.  Call your gastroenterologist and attempt to get on a cancellation list to be seen.

## 2022-02-12 NOTE — Telephone Encounter (Signed)
Pt stated that she was having severe abdominal pain along with severe nausea and stated that she could barely keep fluids down: Pt stated that she feels that she is getting dehydrated where as she is starting to have cramps: Pt was notified to go to her urgent care or the ED to seek evaluation along with treatment as soon as possible. Pt stated that she was currently at the [facility]. Pt was informed this was a wise decision: Pt verbalized understanding with all questions answered.

## 2022-02-12 NOTE — ED Triage Notes (Signed)
Onset 9-days ago pt reports diarrhea stools. No vomiting. She reports having a history of stomach issue in the past but never lasting 9 days.

## 2022-02-12 NOTE — ED Provider Notes (Signed)
Candace Shaw    CSN: [id number] Arrival date & time: 02/12/22  1522      History   Chief Complaint Chief Complaint  Patient presents with  . Diarrhea    HPI Candace Shaw is a 26 y.o. female.   Patient presents to urgent care for evaluation of diarrhea for the last 9 days.  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  She has chronic diarrhea, but states that she usually gets 2-day flareups of acute diarrhea that resolves on its own and her stools have never been this loose for this long.  She describes the diarrhea as liquid, yellow, and states that the stools float on top of the water in the toilet.  She has had approximately 7 episodes of diarrhea today and has been drinking Pedialyte with Gatorade and water to attempt to stay ahead of dehydration.  She is reporting a mild generalized headache, mild nausea, as well as right upper and right lower quadrant abdominal pain.  No blurry vision or decreased visual acuity.  She still has her appendix and her gallbladder.  She was recently on antibiotics for a double ear infection, but states that this has since resolved.  She has attempted use of over-the-counter medications for diarrhea such as Imodium, Pepto-Bismol, and others.  Denies blood to the stool and dark tarry stools.  Denies urinary symptoms, vaginal discharge, and vaginal discomfort.  Denies cough, shortness of breath, chest pain,   Diarrhea   Past Medical History:  Diagnosis Date  . Acute lymphadenitis of lower extremity 02/16/2018  . Alcoholism ([place])   . Anemia   . Asthma   . Bipolar disorder ([place])   . Depression   . Hallucinogen dependence, unspecified 10/03/2012  . Headache(784.0)   . Obesity   . Personality disorder ([place])   . Snake bite 02/16/2018  . Substance abuse ([place])    hallucengins & alcohol - in remission  . Urticaria     Patient Active Problem List   Diagnosis Date Noted  . Arthralgia  03/06/2021  . Diarrhea 03/05/2021  . Nonallopathic lesion of thoracic region 06/30/2016  . Nonallopathic lesion of rib cage 06/30/2016  . Nonallopathic lesion of cervical region 06/30/2016  . Scapular dyskinesis 04/29/2016  . Subacromial bursitis 04/29/2016  . Substance abuse in remission ([place]) 03/28/2015  . Smoker 02/13/2013  . Depression 12/05/2012  . Tobacco user 08/19/2012  . Exercise-induced asthma 04/03/2011    Past Surgical History:  Procedure Laterality Date  . COLONOSCOPY    . WISDOM TOOTH EXTRACTION  Age 83    OB History     Gravida      Para      Term      Preterm      AB      Living  0      SAB      IAB      Ectopic      Multiple      Live Births               Home Medications    Prior to Admission medications   Medication Sig Start Date End Date Taking? Authorizing Provider  ondansetron (ZOFRAN-ODT) 4 MG disintegrating tablet Take 1 tablet (4 mg total) by mouth every 8 (eight) hours as needed for nausea or vomiting. 02/12/22  Yes Talbot Grumbling, FNP  buPROPion (WELLBUTRIN SR) 150 MG 12 hr tablet Take 150 mg by mouth 2 (two) times  daily.    [provider]  diphenhydramine-acetaminophen (TYLENOL PM) 25-500 MG TABS tablet Take 1 tablet by mouth at bedtime as needed.    [provider]  lisdexamfetamine (VYVANSE) 20 MG capsule Take 1 capsule (20 mg total) by mouth daily. 02/03/22   Panosh, Standley Brooking, MD    Family History Family History  Adopted: Yes  Problem Relation Age of Onset  . Alcohol abuse Mother   . Alcohol abuse Father   . Colon cancer Other     Social History Social History   Tobacco Use  . Smoking status: Every Day    Types: E-cigarettes  . Smokeless tobacco: Never  Vaping Use  . Vaping Use: Every day  . Substances: Nicotine  Substance Use Topics  . Alcohol use: Not Currently    Comment: sober since 2014  . Drug use: Not Currently    Types: Marijuana    Comment: alcoholism, hallucingen -  dependancy - in remission     Allergies   Ambien [zolpidem tartrate]   Review of Systems Review of Systems  Gastrointestinal:  Positive for diarrhea.  Per HPI   Physical Exam Triage Vital Signs ED Triage Vitals [02/12/22 1636]  Enc Vitals Group     BP 114/79     Pulse Rate 82     Resp 18     Temp 97.9 F (36.6 C)     Temp Source Oral     SpO2 99 %     Weight      Height      Head Circumference      Peak Flow      Pain Score      Pain Loc      Pain Edu?      Excl. in [place]?    No data found.  Updated Vital Signs BP 114/79 (BP Location: Left Arm)   Pulse 82   Temp 97.9 F (36.6 C) (Oral)   Resp 18   LMP 01/11/2022   SpO2 99%   Visual Acuity Right Eye Distance:   Left Eye Distance:   Bilateral Distance:    Right Eye Near:   Left Eye Near:    Bilateral Near:     Physical Exam Vitals and nursing note reviewed.  Constitutional:      Appearance: Normal appearance. She is not ill-appearing or toxic-appearing.     Comments: Very pleasant patient sitting on exam in position of comfort table in no acute distress.   HENT:     Head: Normocephalic and atraumatic.     Right Ear: Hearing, tympanic membrane, ear canal and external ear normal.     Left Ear: Hearing, tympanic membrane, ear canal and external ear normal.     Nose: Nose normal. No congestion or rhinorrhea.     Mouth/Throat:     Lips: Pink.     Mouth: Mucous membranes are moist.     Pharynx: Uvula midline. No posterior oropharyngeal erythema.  Eyes:     General: Lids are normal. Vision grossly intact. Gaze aligned appropriately.        Right eye: No discharge.        Left eye: No discharge.     Extraocular Movements: Extraocular movements intact.     Conjunctiva/sclera: Conjunctivae normal.     Pupils: Pupils are equal, round, and reactive to light.  Cardiovascular:     Rate and Rhythm: Normal rate and regular rhythm.     Heart sounds: Normal heart sounds, S1 normal and  S2 normal.  Pulmonary:      Effort: Pulmonary effort is normal. No respiratory distress.     Breath sounds: Normal breath sounds and air entry.  Abdominal:     General: Abdomen is flat. Bowel sounds are increased.     Palpations: Abdomen is soft.     Tenderness: There is abdominal tenderness. There is no right CVA tenderness, left CVA tenderness or guarding. Negative signs include Murphy's sign and McBurney's sign.  Musculoskeletal:     Cervical back: Neck supple.  Lymphadenopathy:     Cervical: No cervical adenopathy.  Skin:    General: Skin is warm and dry.     Capillary Refill: Capillary refill takes less than 2 seconds.     Findings: No rash.  Neurological:     General: No focal deficit present.     Mental Status: She is alert and oriented to person, place, and time. Mental status is at baseline.     Cranial Nerves: No dysarthria or facial asymmetry.     Sensory: Sensation is intact.     Motor: Motor function is intact.     Gait: Gait is intact.  Psychiatric:        Mood and Affect: Mood normal.        Speech: Speech normal.        Behavior: Behavior normal.        Thought Content: Thought content normal.        Judgment: Judgment normal.     UC Treatments / Results  Labs (all labs ordered are listed, but only abnormal results are displayed) Labs Reviewed - No data to display  EKG   Radiology No results found.  Procedures Procedures (including critical care time)  Medications Ordered in UC Medications - No data to display  Initial Impression / Assessment and Plan / UC Course  I have reviewed the triage vital signs and the nursing notes.  Pertinent labs & imaging results that were available during my care of the patient were reviewed by me and considered in my medical decision making (see chart for details).  1.  Diarrhea Unknown etiology of patient's diarrhea.  *** Final Clinical Impressions(s) / UC Diagnoses   Final diagnoses:  Diarrhea, unspecified type     Discharge  Instructions      Continue Pedialyte and drinking plenty of water to stay ahead of dehydration related to diarrhea.  Continue over-the-counter medications for diarrhea as needed.  Bring the stool sample back for testing.  Go to the emergency department if you feel that you are becoming more dehydrated, your abdominal pain becomes more severe, or if you develop any new or worsening symptoms.  Call your gastroenterologist and attempt to get on a cancellation list to be seen.     ED Prescriptions     Medication Sig Dispense Auth. Provider   ondansetron (ZOFRAN-ODT) 4 MG disintegrating tablet Take 1 tablet (4 mg total) by mouth every 8 (eight) hours as needed for nausea or vomiting. 20 tablet Talbot Grumbling, FNP      PDMP not reviewed this encounter.

## 2022-02-13 ENCOUNTER — Encounter (HOSPITAL_BASED_OUTPATIENT_CLINIC_OR_DEPARTMENT_OTHER): Payer: Self-pay

## 2022-02-13 ENCOUNTER — Other Ambulatory Visit: Payer: Self-pay

## 2022-02-13 ENCOUNTER — Emergency Department (HOSPITAL_BASED_OUTPATIENT_CLINIC_OR_DEPARTMENT_OTHER)
Admission: EM | Admit: 2022-02-13 | Discharge: 2022-02-13 | Disposition: A | Discharge status: Home / Self Care | Payer: BC Managed Care – PPO | Attending: Emergency Medicine | Admitting: Emergency Medicine

## 2022-02-13 DIAGNOSIS — R21 Rash and other nonspecific skin eruption: Secondary | ICD-10-CM | POA: Diagnosis not present

## 2022-02-13 DIAGNOSIS — R109 Unspecified abdominal pain: Secondary | ICD-10-CM | POA: Diagnosis not present

## 2022-02-13 DIAGNOSIS — R509 Fever, unspecified: Secondary | ICD-10-CM | POA: Insufficient documentation

## 2022-02-13 DIAGNOSIS — R197 Diarrhea, unspecified: Secondary | ICD-10-CM | POA: Diagnosis not present

## 2022-02-13 LAB — URINALYSIS, ROUTINE W REFLEX MICROSCOPIC
Bilirubin Urine: NEGATIVE
Glucose, UA: NEGATIVE mg/dL
Hgb urine dipstick: NEGATIVE
Ketones, ur: NEGATIVE mg/dL
Leukocytes,Ua: NEGATIVE
Nitrite: NEGATIVE
Protein, ur: NEGATIVE mg/dL
Specific Gravity, Urine: <1.005 — ABNORMAL LOW (ref 1.005–1.030)
pH: 6.5 (ref 5.0–8.0)

## 2022-02-13 LAB — CBC WITH DIFFERENTIAL/PLATELET
Abs Immature Granulocytes: 0 10*3/uL (ref 0.00–0.07)
Basophils Absolute: 0 10*3/uL (ref 0.0–0.1)
Basophils Relative: 1 %
Eosinophils Absolute: 0.1 10*3/uL (ref 0.0–0.5)
Eosinophils Relative: 2 %
HCT: 38.5 % (ref 36.0–46.0)
Hemoglobin: 12.9 g/dL (ref 12.0–15.0)
Immature Granulocytes: 0 %
Lymphocytes Relative: 51 %
Lymphs Abs: 2.1 10*3/uL (ref 0.7–4.0)
MCH: 29.6 pg (ref 26.0–34.0)
MCHC: 33.5 g/dL (ref 30.0–36.0)
MCV: 88.3 fL (ref 80.0–100.0)
Monocytes Absolute: 0.3 10*3/uL (ref 0.1–1.0)
Monocytes Relative: 7 %
Neutro Abs: 1.7 10*3/uL (ref 1.7–7.7)
Neutrophils Relative %: 39 %
Platelets: 270 10*3/uL (ref 150–400)
RBC: 4.36 MIL/uL (ref 3.87–5.11)
RDW: 11.9 % (ref 11.5–15.5)
WBC: 4.2 10*3/uL (ref 4.0–10.5)
nRBC: 0 % (ref 0.0–0.2)

## 2022-02-13 LAB — COMPREHENSIVE METABOLIC PANEL
ALT: 13 U/L (ref 0–44)
AST: 15 U/L (ref 15–41)
Albumin: 4.5 g/dL (ref 3.5–5.0)
Alkaline Phosphatase: 39 U/L (ref 38–126)
Anion gap: 8 (ref 5–15)
BUN: 7 mg/dL (ref 6–20)
CO2: 27 mmol/L (ref 22–32)
Calcium: 9.7 mg/dL (ref 8.9–10.3)
Chloride: 102 mmol/L (ref 98–111)
Creatinine, Ser: 0.62 mg/dL (ref 0.44–1.00)
GFR, Estimated: >60 mL/min (ref >60)
Glucose, Bld: 82 mg/dL (ref 70–99)
Potassium: 4.1 mmol/L (ref 3.5–5.1)
Sodium: 137 mmol/L (ref 135–145)
Total Bilirubin: 0.4 mg/dL (ref 0.3–1.2)
Total Protein: 7.5 g/dL (ref 6.5–8.1)

## 2022-02-13 LAB — C DIFFICILE QUICK SCREEN W PCR REFLEX
C Diff antigen: NEGATIVE
C Diff interpretation: NOT DETECTED
C Diff toxin: NEGATIVE

## 2022-02-13 LAB — LIPASE, BLOOD: Lipase: 12 U/L (ref 11–51)

## 2022-02-13 LAB — PREGNANCY, URINE: Preg Test, Ur: NEGATIVE

## 2022-02-13 MED ORDER — PROMETHAZINE HCL 25 MG PO TABS
25.0000 mg | ORAL_TABLET | Freq: Three times a day (TID) | ORAL | 0 refills | Status: AC | PRN
Start: 2022-02-13 — End: ?

## 2022-02-13 MED ORDER — DIPHENOXYLATE-ATROPINE 2.5-0.025 MG PO TABS: 2.0000 | ORAL_TABLET | Freq: Four times a day (QID) | ORAL | 0 refills | Status: AC | PRN

## 2022-02-13 MED ORDER — SODIUM CHLORIDE 0.9 % IV BOLUS
1000.0000 mL | Freq: Once | INTRAVENOUS | Status: AC
  Administered 2022-02-13: 1000 mL via INTRAVENOUS

## 2022-02-13 NOTE — ED Provider Notes (Signed)
[facility] EMERGENCY DEPT Provider Note   CSN: [id number] Arrival date & time: 02/13/22  1043     History  Chief Complaint  Patient presents with   Abdominal Pain   Diarrhea    Candace Shaw is a 26 y.o. female.  Patient here with diarrhea.  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  She is working with the doctor at the [facility] and a rheumatologist locally to hopefully be started on an immunologic seen.  Today she is mostly having acute on chronic diarrhea.  She has been using Pepto-Bismol and Imodium without much help.  Has abdominal cramping at times.  Denies any current fever, nausea, vomiting.  Nothing makes it worse or better.  The history is provided by the patient.       Home Medications Prior to Admission medications   Medication Sig Start Date End Date Taking? Authorizing Provider  diphenoxylate-atropine (LOMOTIL) 2.5-0.025 MG tablet Take 2 tablets by mouth 4 (four) times daily as needed for up to 20 doses for diarrhea or loose stools. 02/13/22  Yes Eilish Mcdaniel, DO  promethazine (PHENERGAN) 25 MG tablet Take 1 tablet (25 mg total) by mouth every 8 (eight) hours as needed for up to 10 doses for nausea. 02/13/22  Yes Valaria Kohut, DO  buPROPion (WELLBUTRIN SR) 150 MG 12 hr tablet Take 150 mg by mouth 2 (two) times daily.    [provider]  diphenhydramine-acetaminophen (TYLENOL PM) 25-500 MG TABS tablet Take 1 tablet by mouth at bedtime as needed.    [provider]  lisdexamfetamine (VYVANSE) 20 MG capsule Take 1 capsule (20 mg total) by mouth daily. 02/03/22   Panosh, Standley Brooking, MD  ondansetron (ZOFRAN-ODT) 4 MG disintegrating tablet Take 1 tablet (4 mg total) by mouth every 8 (eight) hours as needed for nausea or vomiting. 02/12/22   Talbot Grumbling, FNP      Allergies    Ambien [zolpidem tartrate]    Review of Systems    Review of Systems  Physical Exam Updated Vital Signs BP (!) 111/95 (BP Location: Right Arm)   Pulse 81   Temp 98.3 F (36.8 C)   Resp 16   LMP 01/11/2022   SpO2 100%  Physical Exam Vitals and nursing note reviewed.  Constitutional:      General: She is not in acute distress.    Appearance: She is well-developed. She is not ill-appearing.  HENT:     Head: Normocephalic and atraumatic.  Eyes:     Conjunctiva/sclera: Conjunctivae normal.  Cardiovascular:     Rate and Rhythm: Normal rate and regular rhythm.     Heart sounds: Normal heart sounds. No murmur heard. Pulmonary:     Effort: Pulmonary effort is normal. No respiratory distress.     Breath sounds: Normal breath sounds.  Abdominal:     Palpations: Abdomen is soft.     Tenderness: There is no abdominal tenderness.  Musculoskeletal:        General: No swelling.     Cervical back: Neck supple.  Skin:    General: Skin is warm and dry.     Capillary Refill: Capillary refill takes less than 2 seconds.  Neurological:     Mental Status: She is alert.  Psychiatric:        Mood and Affect: Mood normal.     ED Results / Procedures / Treatments   Labs (all labs  ordered are listed, but only abnormal results are displayed) Labs Reviewed  URINALYSIS, ROUTINE W REFLEX MICROSCOPIC - Abnormal; Notable for the following components:      Result Value   Color, Urine COLORLESS (*)    Specific Gravity, Urine <1.005 (*)    All other components within normal limits  C DIFFICILE QUICK SCREEN W PCR REFLEX    GASTROINTESTINAL PANEL BY PCR, STOOL (REPLACES STOOL CULTURE)  CBC WITH DIFFERENTIAL/PLATELET  COMPREHENSIVE METABOLIC PANEL  LIPASE, BLOOD  PREGNANCY, URINE    EKG None  Radiology No results found.  Procedures Procedures    Medications Ordered in ED Medications  sodium chloride 0.9 % bolus 1,000 mL (1,000 mLs Intravenous New Bag/Given 02/13/22 1127)    ED Course/ Medical Decision Making/ A&P                            Medical Decision Making Amount and/or Complexity of Data Reviewed Labs: ordered.  Risk Prescription drug management.   Candace Shaw is here with diarrhea.  History of the same.  Normal vitals.  No fever.  Acute on chronic diarrhea.  She is on recently diagnosed with a rheumatological disorder that causes GI symptoms, rash, arthritis.  She follows with a doctor at [facility] and a local rheumatologist.  They are working on possibly starting her on an immunologic medicine.  She does not have any active fever or rash or chills.  But her diarrhea is worse than normal.  No recent antibiotics.  Overall she appears well.  Vital signs are normal.  She is not particularly tender in her abdomen.  CBC, CMP, lipase, urinalysis was collected.  Per my review and interpretation of these labs they are unremarkable.  I am not concerned for dehydration.  I have low concern for C. difficile or other GI pathogen.  I suspect that this is acute on chronic from her disorder she has.  She has used Imodium and Pepto-Bismol without much relief.  I will trial her on Lomotil to use for the next day or 2 as well as Phenergan as needed after that.  We will have her follow-up with her primary doctors.  Discharged in good condition.  I have no concern for appendicitis or other acute intra-abdominal process at this time.  This chart was dictated using voice recognition software.  Despite best efforts to proofread,  errors can occur which can change the documentation meaning.         Final Clinical Impression(s) / ED Diagnoses Final diagnoses:  Diarrhea, unspecified type    Rx / DC Orders ED Discharge Orders          Ordered    diphenoxylate-atropine (LOMOTIL) 2.5-0.025 MG tablet  4 times daily PRN        02/13/22 1218    promethazine (PHENERGAN) 25 MG tablet  Every 8 hours PRN        02/13/22 1218              Lennice Sites, DO 02/13/22 1220

## 2022-02-13 NOTE — ED Triage Notes (Signed)
Pt presents with lower and RUQ pain associated with diarrhea x9 days. Pt seen at [facility] yesterday for the same. They advised pt to come to ED.    Pt brought a stool sample sample with her

## 2022-02-13 NOTE — ED Notes (Signed)
Discharge paperwork given and understood. 

## 2022-02-13 NOTE — Discharge Instructions (Signed)
Recommend using Lomotil for the next 48 hours.  Make sure you are drinking plenty of fluids while taking this medicine.  If you have any nausea or severe cramping consider taking Phenergan.  Would not use Lomotil for more than 2 days at a time.

## 2022-02-14 LAB — GASTROINTESTINAL PANEL BY PCR, STOOL (REPLACES STOOL CULTURE)

## 2022-02-16 DIAGNOSIS — G43909 Migraine, unspecified, not intractable, without status migrainosus: Secondary | ICD-10-CM | POA: Diagnosis not present

## 2022-02-16 DIAGNOSIS — R1084 Generalized abdominal pain: Secondary | ICD-10-CM | POA: Diagnosis not present

## 2022-02-16 DIAGNOSIS — M042 Cryopyrin-associated periodic syndromes: Secondary | ICD-10-CM | POA: Diagnosis not present

## 2022-02-16 DIAGNOSIS — R197 Diarrhea, unspecified: Secondary | ICD-10-CM | POA: Diagnosis not present

## 2022-02-18 DIAGNOSIS — F603 Borderline personality disorder: Secondary | ICD-10-CM | POA: Diagnosis not present

## 2022-03-09 DIAGNOSIS — M042 Cryopyrin-associated periodic syndromes: Secondary | ICD-10-CM | POA: Diagnosis not present

## 2022-03-10 DIAGNOSIS — F349 Persistent mood [affective] disorder, unspecified: Secondary | ICD-10-CM | POA: Diagnosis not present

## 2022-03-12 DIAGNOSIS — F909 Attention-deficit hyperactivity disorder, unspecified type: Secondary | ICD-10-CM | POA: Diagnosis not present

## 2022-03-25 DIAGNOSIS — F603 Borderline personality disorder: Secondary | ICD-10-CM | POA: Diagnosis not present

## 2022-03-26 DIAGNOSIS — R0981 Nasal congestion: Secondary | ICD-10-CM | POA: Diagnosis not present

## 2022-04-05 DIAGNOSIS — Z6827 Body mass index (BMI) 27.0-27.9, adult: Secondary | ICD-10-CM | POA: Diagnosis not present

## 2022-04-05 DIAGNOSIS — H6692 Otitis media, unspecified, left ear: Secondary | ICD-10-CM | POA: Diagnosis not present

## 2022-04-05 DIAGNOSIS — Z882 Allergy status to sulfonamides status: Secondary | ICD-10-CM | POA: Diagnosis not present

## 2022-04-05 DIAGNOSIS — F909 Attention-deficit hyperactivity disorder, unspecified type: Secondary | ICD-10-CM | POA: Diagnosis not present

## 2022-04-05 DIAGNOSIS — J329 Chronic sinusitis, unspecified: Secondary | ICD-10-CM | POA: Diagnosis not present

## 2022-04-16 DIAGNOSIS — F909 Attention-deficit hyperactivity disorder, unspecified type: Secondary | ICD-10-CM | POA: Diagnosis not present

## 2022-04-19 DIAGNOSIS — N3001 Acute cystitis with hematuria: Secondary | ICD-10-CM | POA: Diagnosis not present

## 2022-04-19 DIAGNOSIS — R309 Painful micturition, unspecified: Secondary | ICD-10-CM | POA: Diagnosis not present

## 2022-04-26 DIAGNOSIS — F603 Borderline personality disorder: Secondary | ICD-10-CM | POA: Diagnosis not present

## 2022-05-07 DIAGNOSIS — F909 Attention-deficit hyperactivity disorder, unspecified type: Secondary | ICD-10-CM | POA: Diagnosis not present

## 2022-12-13 IMAGING — US US BREAST*L* LIMITED INC AXILLA
1 series · 4 of 4 positions shown · non-contrast
Comparison: Previous exam(s).

CLINICAL DATA: 25-year-old female presenting with intermittent
bilateral whitish clear discharge. No associated lump.

EXAM:
ULTRASOUND OF THE BILATERAL BREAST

[Series 1: us breast*left* limited inc axilla · 0.07mm/px · 4 of 4 slices shown]
[im 1/4]
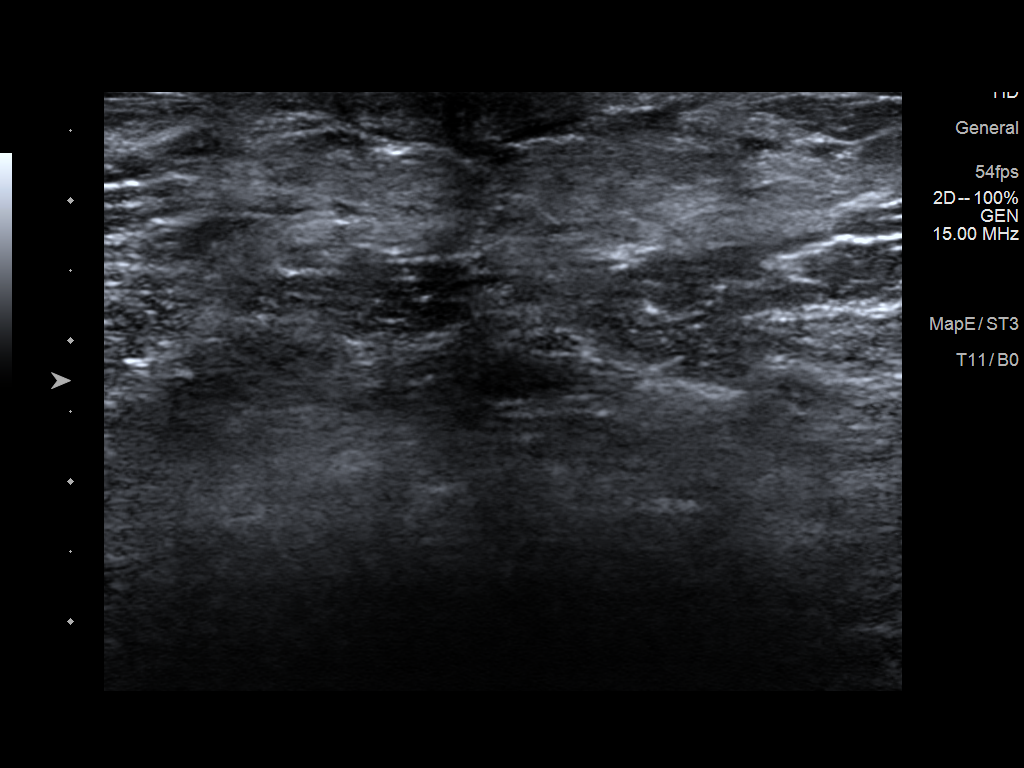
[im 2/4]
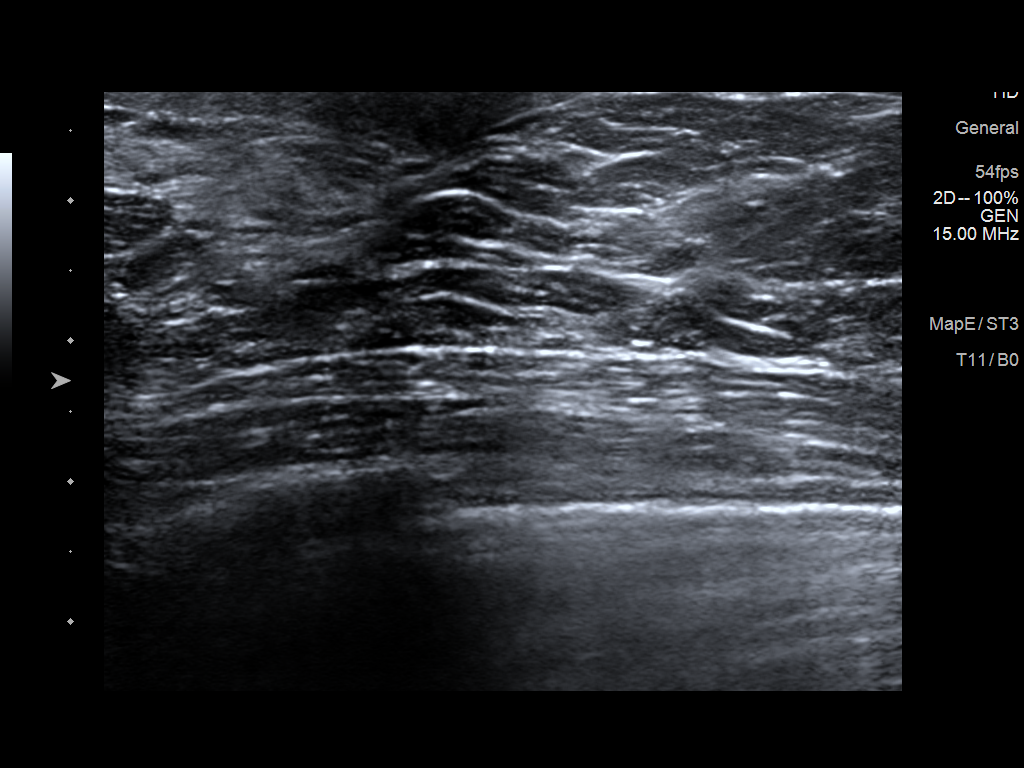
[im 3/4]
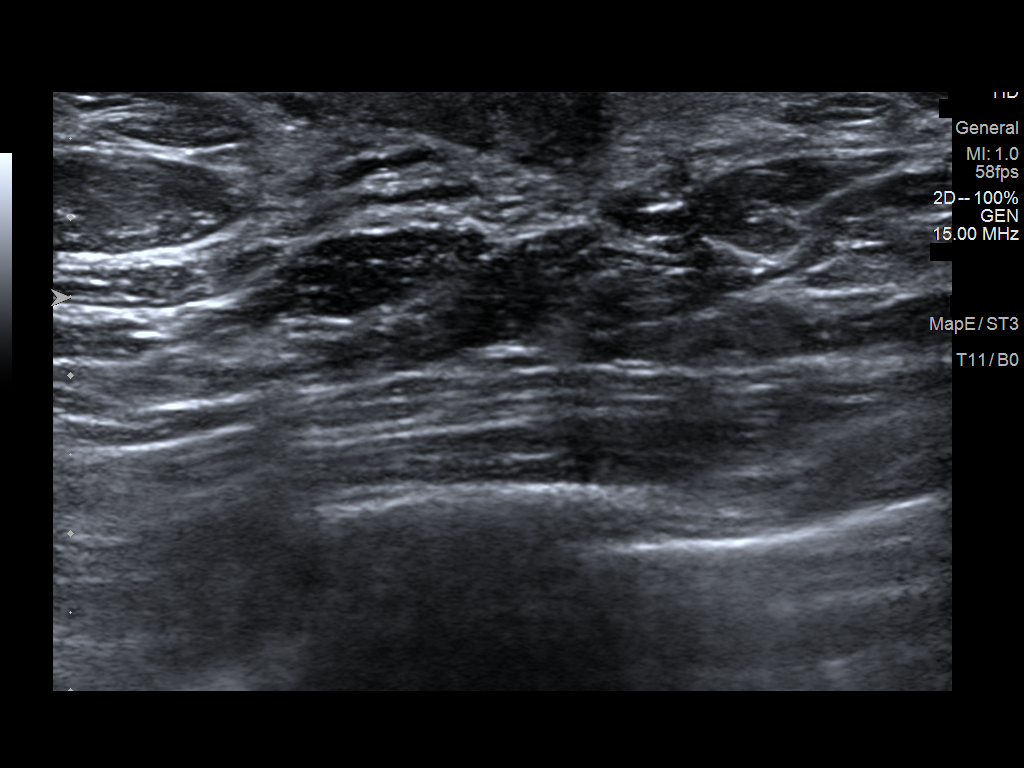
[im 4/4]
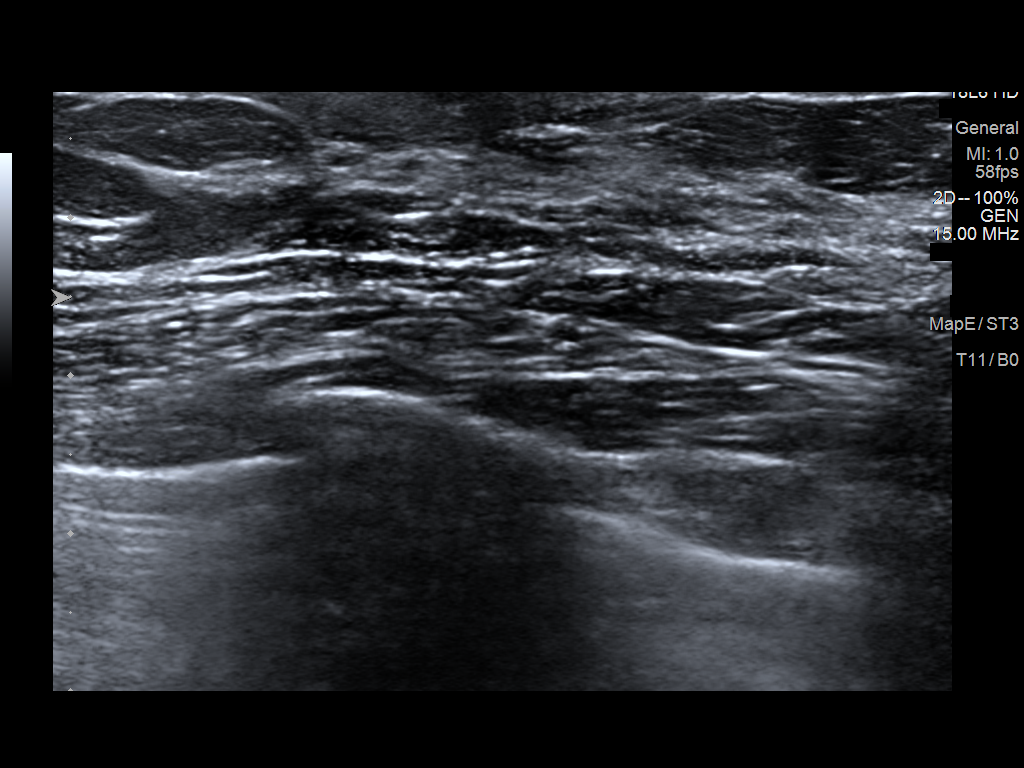

[4 of 4 positions shown; findings below may reference images not displayed]

FINDINGS: On physical exam, of the bilateral breast I do not appreciate any
rash or erythema on the nipples/areolas. No palpable mass. The
patient is not able to express any discharge during the exam today.

Targeted ultrasound is performed in the bilateral retroareolar
regions demonstrating no cystic or solid mass. No dilated duct or
fluid collection.
IMPRESSION: No sonographic evidence of malignancy or other imaging abnormality
in the bilateral retroareolar regions.

RECOMMENDATION:
1. Clinical workup for bilateral milky nipple
discharge/galactorrhea.

2.  Begin routine annual screening mammography at age 38.

I have discussed the findings and recommendations with the patient.
If applicable, a reminder letter will be sent to the patient
regarding the next appointment.

BI-RADS CATEGORY  2: Benign.
